# Patient Record
Sex: Male | Born: 1985 | Race: Black or African American | Hispanic: No | Marital: Single | Smoking: Current every day smoker
Health system: Southern US, Community
[De-identification: ages and names within clinical notes are randomized; demographics above are authoritative.]

## PROBLEM LIST (undated history)

## (undated) DIAGNOSIS — F640 Transsexualism: Secondary | ICD-10-CM

## (undated) DIAGNOSIS — B20 Human immunodeficiency virus [HIV] disease: Secondary | ICD-10-CM

## (undated) DIAGNOSIS — Z789 Other specified health status: Secondary | ICD-10-CM

## (undated) DIAGNOSIS — Z21 Asymptomatic human immunodeficiency virus [HIV] infection status: Secondary | ICD-10-CM

## (undated) HISTORY — PX: APPENDECTOMY: SHX54

---
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
## 2008-05-11 ENCOUNTER — Emergency Department (HOSPITAL_COMMUNITY): Admission: EM | Admit: 2008-05-11 | Discharge: 2008-05-11 | Payer: Self-pay | Admitting: Emergency Medicine

## 2009-01-13 ENCOUNTER — Emergency Department (HOSPITAL_COMMUNITY): Admission: EM | Admit: 2009-01-13 | Discharge: 2009-01-13 | Payer: Self-pay | Admitting: Emergency Medicine

## 2011-03-07 LAB — POCT CARDIAC MARKERS: Myoglobin, poc: 31.6 ng/mL (ref 12–200)

## 2013-05-02 ENCOUNTER — Encounter (HOSPITAL_COMMUNITY): Payer: Self-pay | Admitting: Emergency Medicine

## 2013-05-02 ENCOUNTER — Emergency Department (HOSPITAL_COMMUNITY)
Admission: EM | Admit: 2013-05-02 | Discharge: 2013-05-02 | Disposition: A | Discharge status: Home / Self Care | Payer: Self-pay | Attending: Emergency Medicine | Admitting: Emergency Medicine

## 2013-05-02 DIAGNOSIS — R110 Nausea: Secondary | ICD-10-CM | POA: Insufficient documentation

## 2013-05-02 DIAGNOSIS — L02419 Cutaneous abscess of limb, unspecified: Secondary | ICD-10-CM | POA: Insufficient documentation

## 2013-05-02 DIAGNOSIS — R6883 Chills (without fever): Secondary | ICD-10-CM | POA: Insufficient documentation

## 2013-05-02 DIAGNOSIS — L0390 Cellulitis, unspecified: Secondary | ICD-10-CM

## 2013-05-02 DIAGNOSIS — F17200 Nicotine dependence, unspecified, uncomplicated: Secondary | ICD-10-CM | POA: Insufficient documentation

## 2013-05-02 DIAGNOSIS — R21 Rash and other nonspecific skin eruption: Secondary | ICD-10-CM | POA: Insufficient documentation

## 2013-05-02 MED ORDER — SULFAMETHOXAZOLE-TRIMETHOPRIM 800-160 MG PO TABS: 1.0000 | ORAL_TABLET | Freq: Two times a day (BID) | ORAL | Status: DC

## 2013-05-02 MED ORDER — IBUPROFEN 800 MG PO TABS: 800.0000 mg | ORAL_TABLET | Freq: Three times a day (TID) | ORAL | Status: DC

## 2013-05-02 MED ORDER — SULFAMETHOXAZOLE-TMP DS 800-160 MG PO TABS
1.0000 | ORAL_TABLET | Freq: Once | ORAL | Status: AC
  Administered 2013-05-02: 1 via ORAL
  Filled 2013-05-02: qty 1

## 2013-05-02 MED ORDER — IBUPROFEN 800 MG PO TABS
800.0000 mg | ORAL_TABLET | Freq: Once | ORAL | Status: AC
  Administered 2013-05-02: 800 mg via ORAL
  Filled 2013-05-02: qty 1

## 2013-05-02 NOTE — ED Provider Notes (Signed)
Medical screening examination/treatment/procedure(s) were performed by non-physician practitioner and as supervising physician I was immediately available for consultation/collaboration.    Celene Kras, MD 05/02/13 [phone]

## 2013-05-02 NOTE — ED Provider Notes (Signed)
History     CSN: [id number]  Arrival date & time 05/02/13  7829   First MD Initiated Contact with Patient 05/02/13 [phone]      Chief Complaint  Patient presents with  . Insect Bite    (Consider location/radiation/quality/duration/timing/severity/associated sxs/prior treatment) HPI  Tyler Vargas is a 27 y.o.male presenting to the ER with complaints of warmth and redness after possible insect bite to right thigh. It started on Tuesday and it began to itch and he continued to scratch it until today he began to get concerned when it felt hot and somewhat tender to the touch. He admits to feeling a bit nauseous and having an episode of chills but has not have fevers, vomiting, diarrhea or weakness. He is afebrile here and his vitals are stable.   History reviewed. No pertinent past medical history.  Past Surgical History  Procedure Laterality Date  . Appendectomy      No family history on file.  History  Substance Use Topics  . Smoking status: Current Every Day Smoker -- 10.00 packs/day    Types: Cigarettes  . Smokeless tobacco: Not on file  . Alcohol Use: Yes     Comment: weekends socially      Review of Systems  Skin: Positive for rash.  All other systems reviewed and are negative.    Allergies  Review of patient's allergies indicates no known allergies.  Home Medications   Current Outpatient Rx  Name  Route  Sig  Dispense  Refill  . ibuprofen (ADVIL,MOTRIN) 800 MG tablet   Oral   Take 1 tablet (800 mg total) by mouth 3 (three) times daily.   21 tablet   0   . sulfamethoxazole-trimethoprim (BACTRIM DS) 800-160 MG per tablet   Oral   Take 1 tablet by mouth 2 (two) times daily.   14 tablet   0     BP 141/82  Pulse 91  Temp(Src) 98.7 F (37.1 C) (Oral)  Resp 20  SpO2 96%  Physical Exam  Nursing note and vitals reviewed. Constitutional: He appears well-developed and well-nourished. No distress.  HENT:  Head: Normocephalic and atraumatic.  Eyes:  Pupils are equal, round, and reactive to light.  Neck: Normal range of motion. Neck supple.  Cardiovascular: Normal rate and regular rhythm.   Pulmonary/Chest: Effort normal.  Abdominal: Soft.  Neurological: He is alert.  Skin: Skin is warm and dry.       ED Course  Procedures (including critical care time)  Labs Reviewed - No data to display No results found.   1. Cellulitis       MDM  Given Bactrim to cover for cellulitis and instructions on specific s/sx that warrant return. Advised he is at risk for abscess and if that develops he needs to go to an Urgent Care or return to the ED. Patient looks well, does not appear sick or ill. Cellulitis is very mild.   27 y.o.Tyler Vargas's evaluation in the Emergency Department is complete. It has been determined that no acute conditions requiring further emergency intervention are present at this time. The patient/guardian have been advised of the diagnosis and plan. We have discussed signs and symptoms that warrant return to the ED, such as changes or worsening in symptoms.  Vital signs are stable at discharge. Filed Vitals:   05/02/13 0601  BP: 141/82  Pulse: 91  Temp: 98.7 F (37.1 C)  Resp: 20    Patient/guardian has voiced understanding and agreed to follow-up with the PCP  or specialist.          Dorthula Matas, PA-C 05/02/13 [phone]

## 2013-05-02 NOTE — ED Notes (Signed)
Pt. States that this past Tuesday he was scratching his right thigh and then later that evening it became swollen and painful. He has been taking generic benedryl since that time. Pt states he did have chills and nauseated in the last 24hrs. He has a area on his right lateral thigh just proximal to his hip that is hard, reddened, swollen and warm to touch.

## 2014-03-07 ENCOUNTER — Emergency Department (HOSPITAL_COMMUNITY)
Admission: EM | Admit: 2014-03-07 | Discharge: 2014-03-07 | Disposition: A | Discharge status: Home / Self Care | Payer: Self-pay | Attending: Emergency Medicine | Admitting: Emergency Medicine

## 2014-03-07 ENCOUNTER — Encounter (HOSPITAL_COMMUNITY): Payer: Self-pay | Admitting: Emergency Medicine

## 2014-03-07 ENCOUNTER — Emergency Department (HOSPITAL_COMMUNITY): Payer: Self-pay

## 2014-03-07 DIAGNOSIS — S060X9A Concussion with loss of consciousness of unspecified duration, initial encounter: Secondary | ICD-10-CM

## 2014-03-07 DIAGNOSIS — T148XXA Other injury of unspecified body region, initial encounter: Secondary | ICD-10-CM

## 2014-03-07 DIAGNOSIS — S0180XA Unspecified open wound of other part of head, initial encounter: Secondary | ICD-10-CM | POA: Insufficient documentation

## 2014-03-07 DIAGNOSIS — S02401A Maxillary fracture, unspecified, initial encounter for closed fracture: Secondary | ICD-10-CM | POA: Insufficient documentation

## 2014-03-07 DIAGNOSIS — S01501A Unspecified open wound of lip, initial encounter: Secondary | ICD-10-CM | POA: Insufficient documentation

## 2014-03-07 DIAGNOSIS — S060XAA Concussion with loss of consciousness status unknown, initial encounter: Secondary | ICD-10-CM | POA: Insufficient documentation

## 2014-03-07 DIAGNOSIS — S032XXA Dislocation of tooth, initial encounter: Secondary | ICD-10-CM

## 2014-03-07 DIAGNOSIS — F17200 Nicotine dependence, unspecified, uncomplicated: Secondary | ICD-10-CM | POA: Insufficient documentation

## 2014-03-07 DIAGNOSIS — R4182 Altered mental status, unspecified: Secondary | ICD-10-CM | POA: Insufficient documentation

## 2014-03-07 DIAGNOSIS — S20219A Contusion of unspecified front wall of thorax, initial encounter: Secondary | ICD-10-CM | POA: Insufficient documentation

## 2014-03-07 DIAGNOSIS — Z23 Encounter for immunization: Secondary | ICD-10-CM | POA: Insufficient documentation

## 2014-03-07 DIAGNOSIS — IMO0002 Reserved for concepts with insufficient information to code with codable children: Secondary | ICD-10-CM

## 2014-03-07 DIAGNOSIS — F1010 Alcohol abuse, uncomplicated: Secondary | ICD-10-CM | POA: Insufficient documentation

## 2014-03-07 DIAGNOSIS — S025XXA Fracture of tooth (traumatic), initial encounter for closed fracture: Secondary | ICD-10-CM | POA: Insufficient documentation

## 2014-03-07 DIAGNOSIS — S02400A Malar fracture unspecified, initial encounter for closed fracture: Secondary | ICD-10-CM | POA: Insufficient documentation

## 2014-03-07 DIAGNOSIS — F1210 Cannabis abuse, uncomplicated: Secondary | ICD-10-CM | POA: Insufficient documentation

## 2014-03-07 DIAGNOSIS — S0993XA Unspecified injury of face, initial encounter: Secondary | ICD-10-CM | POA: Diagnosis present

## 2014-03-07 LAB — BASIC METABOLIC PANEL
BUN: 11 mg/dL (ref 6–23)
BUN: 8 mg/dL (ref 6–23)
CALCIUM: 9.1 mg/dL (ref 8.4–10.5)
CO2: 7 mEq/L — CL (ref 19–32)
CO2: 16 meq/L — AB (ref 19–32)
Calcium: 8.1 mg/dL — ABNORMAL LOW (ref 8.4–10.5)
Chloride: 97 mEq/L (ref 96–112)
Chloride: 104 mEq/L (ref 96–112)
Creatinine, Ser: 1.31 mg/dL (ref 0.50–1.35)
Creatinine, Ser: 0.8 mg/dL (ref 0.50–1.35)
GFR calc Af Amer: >90 mL/min (ref >90)
GFR calc non Af Amer: 73 mL/min — ABNORMAL LOW (ref >90)
GFR calc non Af Amer: >90 mL/min (ref >90)
GFR, EST AFRICAN AMERICAN: 85 mL/min — AB (ref >90)
GLUCOSE: 94 mg/dL (ref 70–99)
Glucose, Bld: 151 mg/dL — ABNORMAL HIGH (ref 70–99)
POTASSIUM: 4.2 meq/L (ref 3.7–5.3)
Potassium: 3.6 mEq/L — ABNORMAL LOW (ref 3.7–5.3)
SODIUM: 141 meq/L (ref 137–147)
SODIUM: 139 meq/L (ref 137–147)

## 2014-03-07 LAB — ABO/RH: ABO/RH(D): B POS

## 2014-03-07 LAB — I-STAT ARTERIAL BLOOD GAS, ED
Acid-base deficit: 9 mmol/L — ABNORMAL HIGH (ref 0.0–2.0)
BICARBONATE: 18 meq/L — AB (ref 20.0–24.0)
O2 Saturation: 97 %
PCO2 ART: 40.7 mmHg (ref 35.0–45.0)
PH ART: 7.254 — AB (ref 7.350–7.450)
Patient temperature: 98.6
TCO2: 19 mmol/L (ref 0–100)
pO2, Arterial: 107 mmHg — ABNORMAL HIGH (ref 80.0–100.0)

## 2014-03-07 LAB — URINALYSIS, ROUTINE W REFLEX MICROSCOPIC
Bilirubin Urine: NEGATIVE
GLUCOSE, UA: NEGATIVE mg/dL
Ketones, ur: NEGATIVE mg/dL
LEUKOCYTES UA: NEGATIVE
NITRITE: NEGATIVE
PROTEIN: 100 mg/dL — AB
Specific Gravity, Urine: 1.014 (ref 1.005–1.030)
UROBILINOGEN UA: 0.2 mg/dL (ref 0.0–1.0)
pH: 5.5 (ref 5.0–8.0)

## 2014-03-07 LAB — CBC WITH DIFFERENTIAL/PLATELET
BASOS ABS: 0 10*3/uL (ref 0.0–0.1)
Basophils Relative: 0 % (ref 0–1)
Eosinophils Absolute: 0.2 10*3/uL (ref 0.0–0.7)
Eosinophils Relative: 1 % (ref 0–5)
HCT: 44.2 % (ref 39.0–52.0)
HEMOGLOBIN: 14.2 g/dL (ref 13.0–17.0)
LYMPHS PCT: 31 % (ref 12–46)
Lymphs Abs: 5.7 10*3/uL — ABNORMAL HIGH (ref 0.7–4.0)
MCH: 32.9 pg (ref 26.0–34.0)
MCHC: 32.1 g/dL (ref 30.0–36.0)
MCV: 102.3 fL — ABNORMAL HIGH (ref 78.0–100.0)
MONO ABS: 0.7 10*3/uL (ref 0.1–1.0)
Monocytes Relative: 4 % (ref 3–12)
NEUTROS PCT: 64 % (ref 43–77)
Neutro Abs: 11.7 10*3/uL — ABNORMAL HIGH (ref 1.7–7.7)
Platelets: 392 10*3/uL (ref 150–400)
RBC: 4.32 MIL/uL (ref 4.22–5.81)
RDW: 14.8 % (ref 11.5–15.5)
WBC: 18.3 10*3/uL — ABNORMAL HIGH (ref 4.0–10.5)

## 2014-03-07 LAB — TYPE AND SCREEN
ABO/RH(D): B POS
ANTIBODY SCREEN: NEGATIVE

## 2014-03-07 LAB — RAPID URINE DRUG SCREEN, HOSP PERFORMED
AMPHETAMINES: NOT DETECTED
Barbiturates: NOT DETECTED
Benzodiazepines: NOT DETECTED
Cocaine: NOT DETECTED
Opiates: NOT DETECTED
Tetrahydrocannabinol: POSITIVE — AB

## 2014-03-07 LAB — URINE MICROSCOPIC-ADD ON

## 2014-03-07 LAB — ETHANOL: ALCOHOL ETHYL (B): 167 mg/dL — AB (ref 0–11)

## 2014-03-07 LAB — CDS SEROLOGY

## 2014-03-07 LAB — APTT: APTT: 27 s (ref 24–37)

## 2014-03-07 LAB — PROTIME-INR
INR: 1.17 (ref 0.00–1.49)
Prothrombin Time: 14.7 seconds (ref 11.6–15.2)

## 2014-03-07 MED ORDER — FENTANYL CITRATE 0.05 MG/ML IJ SOLN
50.0000 ug | Freq: Once | INTRAMUSCULAR | Status: AC
  Administered 2014-03-07: 50 ug via INTRAVENOUS

## 2014-03-07 MED ORDER — PREDNISONE 10 MG PO TABS: ORAL_TABLET | ORAL | Status: DC

## 2014-03-07 MED ORDER — ONDANSETRON HCL 4 MG/2ML IJ SOLN
4.0000 mg | Freq: Once | INTRAMUSCULAR | Status: AC
  Administered 2014-03-07: 4 mg via INTRAVENOUS
  Filled 2014-03-07: qty 2

## 2014-03-07 MED ORDER — FENTANYL CITRATE 0.05 MG/ML IJ SOLN
50.0000 ug | Freq: Once | INTRAMUSCULAR | Status: AC
  Administered 2014-03-07: 50 ug via INTRAVENOUS
  Filled 2014-03-07 (×2): qty 2

## 2014-03-07 MED ORDER — SODIUM CHLORIDE 0.9 % IV BOLUS (SEPSIS)
1000.0000 mL | Freq: Once | INTRAVENOUS | Status: AC
  Administered 2014-03-07: 1000 mL via INTRAVENOUS

## 2014-03-07 MED ORDER — DEXAMETHASONE SODIUM PHOSPHATE 10 MG/ML IJ SOLN
10.0000 mg | Freq: Once | INTRAMUSCULAR | Status: AC
  Administered 2014-03-07: 10 mg via INTRAVENOUS
  Filled 2014-03-07: qty 1

## 2014-03-07 MED ORDER — FENTANYL CITRATE 0.05 MG/ML IJ SOLN
INTRAMUSCULAR | Status: AC
  Filled 2014-03-07: qty 2

## 2014-03-07 MED ORDER — OXYCODONE-ACETAMINOPHEN 5-325 MG PO TABS: 1.0000 | ORAL_TABLET | Freq: Four times a day (QID) | ORAL | Status: DC | PRN

## 2014-03-07 MED ORDER — CEFAZOLIN SODIUM 1-5 GM-% IV SOLN
1.0000 g | Freq: Once | INTRAVENOUS | Status: AC
  Administered 2014-03-07: 1 g via INTRAVENOUS
  Filled 2014-03-07: qty 50

## 2014-03-07 MED ORDER — SODIUM CHLORIDE 0.9 % IV BOLUS (SEPSIS)
1000.0000 mL | INTRAVENOUS | Status: AC
  Administered 2014-03-07: 1000 mL via INTRAVENOUS

## 2014-03-07 MED ORDER — TETANUS-DIPHTH-ACELL PERTUSSIS 5-2.5-18.5 LF-MCG/0.5 IM SUSP
0.5000 mL | Freq: Once | INTRAMUSCULAR | Status: AC
  Administered 2014-03-07: 0.5 mL via INTRAMUSCULAR
  Filled 2014-03-07: qty 0.5

## 2014-03-07 MED ORDER — CEPHALEXIN 500 MG PO CAPS: 500.0000 mg | ORAL_CAPSULE | Freq: Four times a day (QID) | ORAL | Status: AC
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
## 2014-03-07 NOTE — ED Notes (Signed)
[facility] remains [facility], answering questions, NAD, calm, no dyspnea noted, airway remains intact, VSS.

## 2014-03-07 NOTE — ED Notes (Signed)
[facility]
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
## 2014-03-07 NOTE — ED Notes (Signed)
Patient discharged to home with family. NAD.  

## 2014-03-07 NOTE — ED Notes (Signed)
Per Dr. Karleen HampshireSpencer. Will complete summary/note and enter medications for opthalmology.
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
## 2014-03-07 NOTE — ED Notes (Signed)
Dr Spencer at bedside

## 2014-03-07 NOTE — ED Notes (Signed)
Back from CT, no changes, using urinal with assistance. earring removed in CT, placed in denture cup (labeled).

## 2014-03-07 NOTE — Discharge Instructions (Signed)
Concussion, Adult °A concussion, or closed-head injury, is a brain injury caused by a direct blow to the head or by a quick and sudden movement (jolt) of the head or neck. Concussions are usually not life-threatening. Even so, the effects of a concussion can be serious. If you have had a concussion before, you are more likely to experience concussion-like symptoms after a direct blow to the head.  °CAUSES  °· Direct blow to the head, such as from running into another player during a soccer game, being hit in a fight, or hitting your head on a hard surface. °· A jolt of the head or neck that causes the brain to move back and forth inside the skull, such as in a car crash. °SIGNS AND SYMPTOMS  °The signs of a concussion can be hard to notice. Early on, they may be missed by you, family members, and health care providers. You may look fine but act or feel differently. °Symptoms are usually temporary, but they may last for days, weeks, or even longer. Some symptoms may appear right away while others may not show up for hours or days. Every head injury is different. Symptoms include:  °· Mild to moderate headaches that will not go away. °· A feeling of pressure inside your head.  °· Having more trouble than usual:   °· Learning or remembering things you have heard. °· Answering questions.  °· Paying attention or concentrating.   °· Organizing daily tasks.   °· Making decisions and solving problems.   °· Slowness in thinking, acting or reacting, speaking, or reading.   °· Getting lost or being easily confused.   °· Feeling tired all the time or lacking energy (fatigued).   °· Feeling drowsy.   °· Sleep disturbances.   °· Sleeping more than usual.   °· Sleeping less than usual.   °· Trouble falling asleep.   °· Trouble sleeping (insomnia).   °· Loss of balance or feeling lightheaded or dizzy.   °· Nausea or vomiting.   °· Numbness or tingling.   °· Increased sensitivity to:   °· Sounds.   °· Lights.   °· Distractions.    °· Vision problems or eyes that tire easily.   °· Diminished sense of taste or smell.   °· Ringing in the ears.   °· Mood changes such as feeling sad or anxious.   °· Becoming easily irritated or angry for little or no reason.   °· Lack of motivation. °· Seeing or hearing things other people do not see or hear (hallucinations). °DIAGNOSIS  °Your health care provider can usually diagnose a concussion based on a description of your injury and symptoms. He or she will ask whether you passed out (lost consciousness) and whether you are having trouble remembering events that happened right before and during your injury.  °Your evaluation might include:  °· A brain scan to look for signs of injury to the brain. Even if the test shows no injury, you may still have a concussion.   °· Blood tests to be sure other problems are not present. °TREATMENT  °· Concussions are usually treated in an emergency department, in urgent care, or at a clinic. You may need to stay in the hospital overnight for further treatment.   °· Tell your health care provider if you are taking any medicines, including prescription medicines, over-the-counter medicines, and natural remedies. Some medicines, such as blood thinners (anticoagulants) and aspirin, may increase the chance of complications. Also tell your health care provider whether you have had alcohol or are taking illegal drugs. This information may affect treatment. °· Your health care provider will send you   home with important instructions to follow. °· How fast you will recover from a concussion depends on many factors. These factors include how severe your concussion is, what part of your brain was injured, your age, and how healthy you were before the concussion. °· Most people with mild injuries recover fully. Recovery can take time. In general, recovery is slower in older persons. Also, persons who have had a concussion in the past or have other medical problems may find that it  takes longer to recover from their current injury. °HOME CARE INSTRUCTIONS  °General Instructions °· Carefully follow the directions your health care provider gave you. °· Only take over-the-counter or prescription medicines for pain, discomfort, or fever as directed by your health care provider. °· Take only those medicines that your health care provider has approved. °· Do not drink alcohol until your health care provider says you are well enough to do so. Alcohol and certain other drugs may slow your recovery and can put you at risk of further injury. °· If it is harder than usual to remember things, write them down. °· If you are easily distracted, try to do one thing at a time. For example, do not try to watch TV while fixing dinner. °· Talk with family members or close friends when making important decisions. °· Keep all follow-up appointments. Repeated evaluation of your symptoms is recommended for your recovery. °· Watch your symptoms and tell others to do the same. Complications sometimes occur after a concussion. Older adults with a brain injury may have a higher risk of serious complications such as of a blood clot on the brain. °· Tell your teachers, school nurse, school counselor, coach, athletic trainer, or work manager about your injury, symptoms, and restrictions. Tell them about what you can or cannot do. They should watch for:   °· Increased problems with attention or concentration.   °· Increased difficulty remembering or learning new information.   °· Increased time needed to complete tasks or assignments.   °· Increased irritability or decreased ability to cope with stress.   °· Increased symptoms.   °· Rest. Rest helps the brain to heal. Make sure you: °· Get plenty of sleep at night. Avoid staying up late at night. °· Keep the same bedtime hours on weekends and weekdays. °· Rest during the day. Take daytime naps or rest breaks when you feel tired. °· Limit activities that require a lot of  thought or concentration. These includes   °· Doing homework or job-related work.   °· Watching TV.   °· Working on the computer. °· Avoid any situation where there is potential for another head injury (football, hockey, soccer, basketball, martial arts, downhill snow sports and horseback riding). Your condition will get worse every time you experience a concussion. You should avoid these activities until you are evaluated by the appropriate follow-up caregivers. °Returning To Your Regular Activities °You will need to return to your normal activities slowly, not all at once. You must give your body and brain enough time for recovery. °· Do not return to sports or other athletic activities until your health care provider tells you it is safe to do so. °· Ask your health care provider when you can drive, ride a bicycle, or operate heavy machinery. Your ability to react may be slower after a brain injury. Never do these activities if you are dizzy. °· Ask your health care provider about when you can return to work or school. °Preventing Another Concussion °It is very important to avoid another   brain injury, especially before you have recovered. In rare cases, another injury can lead to permanent brain damage, brain swelling, or death. The risk of this is greatest during the first 7 10 days after a head injury. Avoid injuries by:   Wearing a seat belt when riding in a car.   Drinking alcohol only in moderation.   Wearing a helmet when biking, skiing, skateboarding, skating, or doing similar activities.  Avoiding activities that could lead to a second concussion, such as contact or recreational sports, until your health care provider says it is OK.  Taking safety measures in your home.   Remove clutter and tripping hazards from floors and stairways.   Use grab bars in bathrooms and handrails by stairs.   Place non-slip mats on floors and in bathtubs.   Improve lighting in dim areas. SEEK MEDICAL  CARE IF:   You have increased problems paying attention or concentrating.   You have increased difficulty remembering or learning new information.   You need more time to complete tasks or assignments than before.   You have increased irritability or decreased ability to cope with stress.  You have more symptoms than before. Seek medical care if you have any of the following symptoms for more than 2 weeks after your injury:   Lasting (chronic) headaches.   Dizziness or balance problems.   Nausea.  Vision problems.   Increased sensitivity to noise or light.   Depression or mood swings.   Anxiety or irritability.   Memory problems.   Difficulty concentrating or paying attention.   Sleep problems.   Feeling tired all the time. SEEK IMMEDIATE MEDICAL CARE IF:   You have severe or worsening headaches. These may be a sign of a blood clot in the brain.  You have weakness (even if only in one hand, leg, or part of the face).  You have numbness.  You have decreased coordination.   You vomit repeatedly.  You have increased sleepiness.  One pupil is larger than the other.   You have convulsions.   You have slurred speech.   You have increased confusion. This may be a sign of a blood clot in the brain.  You have increased restlessness, agitation, or irritability.   You are unable to recognize people or places.   You have neck pain.   It is difficult to wake you up.   You have unusual behavior changes.   You lose consciousness. MAKE SURE YOU:   Understand these instructions.  Will watch your condition.  Will get help right away if you are not doing well or get worse. Document Released: 01/27/2004 Document Revised: 07/09/2013 Document Reviewed: 05/29/2013 [facility] Patient Information 2014 [employer], [place].  Eat a soft diet. Maintain good oral hygiene. Keep the head of your bed elevated. You may clean the wounds on your face with soap  and water.

## 2014-03-07 NOTE — ED Notes (Signed)
No changes. To CT. Ancef & warm NS infusing.
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
## 2014-03-07 NOTE — ED Notes (Addendum)
Dr Kelly SplinterSanger at bedside to suture wounds on patients face.

## 2014-03-09 ENCOUNTER — Encounter (HOSPITAL_COMMUNITY): Payer: Self-pay | Admitting: Emergency Medicine

## 2014-04-17 ENCOUNTER — Emergency Department (HOSPITAL_BASED_OUTPATIENT_CLINIC_OR_DEPARTMENT_OTHER)
Admission: EM | Admit: 2014-04-17 | Discharge: 2014-04-17 | Disposition: A | Discharge status: Home / Self Care | Payer: Self-pay | Attending: Emergency Medicine | Admitting: Emergency Medicine

## 2014-04-17 ENCOUNTER — Encounter (HOSPITAL_BASED_OUTPATIENT_CLINIC_OR_DEPARTMENT_OTHER): Payer: Self-pay | Admitting: Emergency Medicine

## 2014-04-17 DIAGNOSIS — Z791 Long term (current) use of non-steroidal anti-inflammatories (NSAID): Secondary | ICD-10-CM | POA: Insufficient documentation

## 2014-04-17 DIAGNOSIS — F17200 Nicotine dependence, unspecified, uncomplicated: Secondary | ICD-10-CM | POA: Insufficient documentation

## 2014-04-17 DIAGNOSIS — K029 Dental caries, unspecified: Secondary | ICD-10-CM | POA: Insufficient documentation

## 2014-04-17 DIAGNOSIS — K047 Periapical abscess without sinus: Secondary | ICD-10-CM

## 2014-04-17 DIAGNOSIS — Z792 Long term (current) use of antibiotics: Secondary | ICD-10-CM | POA: Insufficient documentation

## 2014-04-17 MED ORDER — IBUPROFEN 600 MG PO TABS: 600.0000 mg | ORAL_TABLET | Freq: Four times a day (QID) | ORAL | Status: DC | PRN

## 2014-04-17 MED ORDER — PENICILLIN V POTASSIUM 500 MG PO TABS: 500.0000 mg | ORAL_TABLET | Freq: Four times a day (QID) | ORAL | Status: DC

## 2014-04-17 MED ORDER — PENICILLIN V POTASSIUM 250 MG PO TABS
500.0000 mg | ORAL_TABLET | Freq: Four times a day (QID) | ORAL | Status: DC
  Administered 2014-04-17: 500 mg via ORAL
  Filled 2014-04-17: qty 2

## 2014-04-17 NOTE — ED Provider Notes (Signed)
CSN: [id number]     Arrival date & time 04/17/14  2993 History   First MD Initiated Contact with Patient 04/17/14 [phone]     Chief Complaint  Patient presents with  . Oral Swelling      HPI Pt reports area to left, top, back tooth that has been decayed - presents with left sided facial swelling and pain noted area last night. Patient reports taking Ibuprofen at 0400. Pt resides at [facility].   History reviewed. No pertinent past medical history. Past Surgical History  Procedure Laterality Date  . Appendectomy     History reviewed. No pertinent family history. History  Substance Use Topics  . Smoking status: Current Every Day Smoker -- 10.00 packs/day    Types: Cigarettes  . Smokeless tobacco: Not on file  . Alcohol Use: Yes     Comment: weekends socially    Review of Systems  All other systems reviewed and are negative   Allergies  Review of patient's allergies indicates no known allergies.  Home Medications   Prior to Admission medications   Medication Sig Start Date End Date Taking? Authorizing Provider  ibuprofen (ADVIL,MOTRIN) 800 MG tablet Take 1 tablet (800 mg total) by mouth 3 (three) times daily. 05/02/13  Yes Tiffany Irine Seal, PA-C  diphenhydrAMINE (BENADRYL) 25 mg capsule Take 25 mg by mouth every 4 (four) hours as needed for itching.    Historical Provider, MD  ibuprofen (ADVIL,MOTRIN) 600 MG tablet Take 1 tablet (600 mg total) by mouth every 6 (six) hours as needed. 04/17/14   Nelia Shi, MD  oxyCODONE-acetaminophen (PERCOCET) 5-325 MG per tablet Take 1-2 tablets by mouth every 6 (six) hours as needed for moderate pain. 03/07/14   Junius Argyle, MD  penicillin v potassium (VEETID) 500 MG tablet Take 1 tablet (500 mg total) by mouth 4 (four) times daily. 04/17/14   Nelia Shi, MD  predniSONE (DELTASONE) 10 MG tablet Take 100 mg or 10 tablets on days 1 and 2. Take 50 mg or 5 tablets on days 3 through 5. Take 20 mg or 2 tablets on days 6 through 9. 03/07/14    Junius Argyle, MD  sulfamethoxazole-trimethoprim (BACTRIM DS) 800-160 MG per tablet Take 1 tablet by mouth 2 (two) times daily. 05/02/13   Tiffany Irine Seal, PA-C   BP 129/75  Pulse 99  Temp(Src) 99.6 F (37.6 C) (Oral)  Resp 20  Ht 5\' 8"  (1.727 m)  Wt 165 lb (74.844 kg)  BMI 25.09 kg/m2  SpO2 100% Physical Exam  Nursing note and vitals reviewed. Constitutional: He is oriented to person, place, and time. He appears well-developed and well-nourished. No distress.  HENT:  Head: Normocephalic and atraumatic.    Mouth/Throat: Uvula is midline and oropharynx is clear and moist. Dental abscesses and dental caries present.    Eyes: Pupils are equal, round, and reactive to light.  Neck: Normal range of motion.  Cardiovascular: Normal rate and intact distal pulses.   Pulmonary/Chest: No respiratory distress.  Abdominal: Normal appearance. He exhibits no distension.  Musculoskeletal: Normal range of motion.  Neurological: He is alert and oriented to person, place, and time. No cranial nerve deficit.  Skin: Skin is warm and dry. No rash noted.  Psychiatric: He has a normal mood and affect. His behavior is normal.    ED Course  Procedures (including critical care time) Labs Review Labs Reviewed - No data to display  Imaging Review No results found.    MDM  Final diagnoses:  Tooth abscess        Nelia Shiobert L Shamira Toutant, MD 04/20/14 [phone]

## 2014-04-17 NOTE — Discharge Instructions (Signed)
Abscessed Tooth An abscessed tooth is an infection around your tooth. It may be caused by holes or damage to the tooth (cavity) or a dental disease. An abscessed tooth causes mild to very bad pain in and around the tooth. See your dentist right away if you have tooth or gum pain. HOME CARE  Take your medicine as told. Finish it even if you start to feel better.  Do not drive after taking pain medicine.  Rinse your mouth (gargle) often with salt water ( teaspoon salt in 8 ounces of warm water).  Do not apply heat to the outside of your face. GET HELP RIGHT AWAY IF:   You have a temperature by mouth above 102 F (38.9 C), not controlled by medicine.  You have chills and a very bad headache.  You have problems breathing or swallowing.  Your mouth will not open.  You develop puffiness (swelling) on the neck or around the eye.  Your pain is not helped by medicine.  Your pain is getting worse instead of better. MAKE SURE YOU:   Understand these instructions.  Will watch your condition.  Will get help right away if you are not doing well or get worse. Document Released: 04/24/2008 Document Revised: 01/29/2012 Document Reviewed: 02/14/2011 [facility] Patient Information 2014 [place].    Emergency Department Resource Guide 1) Find a Doctor and Pay Out of Pocket Although you won't have to find out who is covered by your insurance plan, it is a good idea to ask around and get recommendations. You will then need to call the office and see if the doctor you have chosen will accept you as a new patient and what types of options they offer for patients who are self-pay. Some doctors offer discounts or will set up payment plans for their patients who do not have insurance, but you will need to ask so you aren't surprised when you get to your appointment.  2) Contact Your Local Health Department Not all health departments have doctors that can see patients for sick visits, but many  do, so it is worth a call to see if yours does. If you don't know where your local health department is, you can check in your phone book. The CDC also has a tool to help you locate your state's health department, and many state websites also have listings of all of their local health departments.  3) Find a [facility] If your illness is not likely to be very severe or complicated, you may want to try a walk in clinic. These are popping up all over the country in pharmacies, drugstores, and shopping centers. They're usually staffed by nurse practitioners or physician assistants that have been trained to treat common illnesses and complaints. They're usually fairly quick and inexpensive. However, if you have serious medical issues or chronic medical problems, these are probably not your best option.  No Primary Care Doctor: - Call Health Connect at  [phone] - they can help you locate a primary care doctor that  accepts your insurance, provides certain services, etc. - Physician Referral Service- [phone]  Chronic Pain Problems: Organization         Address  Phone   Notes  [facility]  [phone] Patients need to be referred by their primary care doctor.   Medication Assistance: Organization         Address  Phone   Notes  [facility] Medication Assistance Program [address]  [place], [place] [address] [phone] --Must be a resident of [facility] -- Must have NO insurance coverage whatsoever (no Medicaid/ Medicare, etc.) -- The pt. MUST have a primary care doctor that directs their care regularly and follows them in the community   [facility]  [phone]   [employer]  [phone]    Agencies that provide inexpensive medical care: Organization         Address  Phone   Notes  [place]  [phone]   [facility]    [phone]   [facility], [place] [address] [phone]   [place] [address], [place] [phone]   [facility]anned Parenthood    [phone]   [facility]    [phone]   [address], [place] Phone:  [phone], Fax:  [phone] Hours of Operation:  9 am - 6 pm, M-F.  Also accepts Medicaid/Medicare and self-pay.  [employer] for [place] [address], [address], [place] Phone: [phone], Fax: [phone]. Hours of Operation:  8:30 am - 5:30 pm, M-F.  Also accepts Medicaid and self-pay.  [facility] [place] [address], [place] Phone: [phone]   [place], [place], [place] [phone], Ext. [phone] Mondays & Thursdays: 7-9 AM.  First 15 patients are seen on a first come, first serve basis.    [place] Providers:  Organization         Address  Phone   Notes  [facility] [address],  [phone] Also accepts self-pay patients.  [facility] [address], [place]  [phone]   [place], [address], [place] [phone]   [facility] [address], [place] [phone]   Lucianne Lei [address], [place]   [phone] Only accepts [place] patients after they have their name applied to their card.   Self-Pay (no insurance) in [facility]:  Organization         Address  Phone   Notes  Sickle Cell Patients, [facility] [place] [phone]   [facility] [place] [phone]   [facility] [place]  [place], [place], [place] [phone]   [facility]/Dr. Osei-Bonsu  [address] [place] or  [address], [place] [phone] Phone number for both [place] and [place] locations is the same.  [facility] [address], [place] [phone]   [facility] [address], [place] or [address] [phone]   [facility] [address], [place] [phone], phone; [phone], fax Sees patients 1st and 3rd Saturday of every month.  Must not qualify for public or private insurance (i.e. Medicaid, Medicare,  Health Choice, Veterans' Benefits)  Household income should be no more than 200% of the poverty level The clinic cannot treat you if you are pregnant or think you are pregnant  Sexually transmitted diseases are not treated at the clinic.    Dental  Care: Organization         Address  Phone  Notes  [facility] [address] [place], [place] [phone] Accepts children up to age 8 who are enrolled in [place] or [place] Health Choice; pregnant women with a Medicaid card; and children who have applied for Medicaid or [place] Health Choice, but were declined, whose parents can pay a reduced fee at time of service.  [facility]  [address], [place] [phone] Accepts children up to age 65 who are enrolled in [place] or [place] Health Choice; pregnant women with a Medicaid card; and children who have applied for Medicaid or [place] Health Choice, but were declined, whose parents can pay a reduced fee at time of service.  [facility]  [address] [place], [place] [phone] Patients are seen by appointment only. Walk-ins are not accepted. [facility] will see patients 22 years of age and older. Monday - Tuesday (8am-5pm) Most Wednesdays (8:30-5pm) $30 per visit, cash only  [facility]  [address], [facility] [phone] Patients are seen by appointment only. Walk-ins are not accepted. [facility] will see patients 76 years of age and older. One Wednesday Evening (Monthly: Volunteer Based).  $30 per visit, cash only  [employer]  [phone] for adults; Children under age 71, call Graduate Pediatric Dentistry at [phone]. Children aged 68-14, please call [phone] to request a pediatric application.  Dental services are provided in all areas of dental care including fillings, crowns and bridges, complete and partial dentures, implants, gum treatment, root canals, and extractions. Preventive care is also provided. Treatment is provided to both adults and children. Patients are selected via a lottery and there is often a waiting list.   [facility] [address] [place]  [phone] [email or web address]   [facility] [address] [place], [place] [phone], Ext. [phone] Second and Fourth Thursday of each month, opens at 6:30 AM; Clinic ends at 9 AM.  Patients are seen on a first-come first-served basis, and a limited number are seen during each clinic.   [facility]  [address] [place], [place] [phone]   Eligibility Requirements You must have lived in [place], [place], or [place] counties for at least the last three months.   You cannot be eligible for state or federal sponsored [place], including [employer], [place], or [employer].   You generally cannot be eligible for healthcare insurance through your employer.    How to apply: Eligibility screenings are held every Tuesday and Wednesday afternoon from 1:00 pm until 4:00 pm. You do not need an appointment for the interview!  [facility] [address], [place], [place] [phone]   [facility] Health Department  [phone]   [facility] Health Department  [phone]   [facility]  Health Department  [phone]

## 2014-04-17 NOTE — ED Notes (Addendum)
Pt reports area to left, top, back tooth that has been decayed - presents with left sided facial swelling and pain noted to area last night. Patient reports taking Ibuprofen at 0400. Pt resides at [facility].

## 2016-08-17 ENCOUNTER — Emergency Department (HOSPITAL_COMMUNITY)
Admission: EM | Admit: 2016-08-17 | Discharge: 2016-08-17 | Disposition: A | Discharge status: Home / Self Care | Payer: Self-pay | Attending: Emergency Medicine | Admitting: Emergency Medicine

## 2016-08-17 ENCOUNTER — Encounter (HOSPITAL_COMMUNITY): Payer: Self-pay | Admitting: Emergency Medicine

## 2016-08-17 DIAGNOSIS — J040 Acute laryngitis: Secondary | ICD-10-CM | POA: Insufficient documentation

## 2016-08-17 DIAGNOSIS — F17210 Nicotine dependence, cigarettes, uncomplicated: Secondary | ICD-10-CM | POA: Insufficient documentation

## 2016-08-17 LAB — RAPID STREP SCREEN (MED CTR MEBANE ONLY): Streptococcus, Group A Screen (Direct): POSITIVE — AB

## 2016-08-17 MED ORDER — PENICILLIN G BENZATHINE & PROC 1200000 UNIT/2ML IM SUSP
1.2000 10*6.[IU] | Freq: Once | INTRAMUSCULAR | Status: AC
  Administered 2016-08-17: 1.2 10*6.[IU] via INTRAMUSCULAR
  Filled 2016-08-17: qty 2

## 2016-08-17 NOTE — ED Triage Notes (Signed)
Onset 2 weeks ago sore throat worsening overtime. Airway intact bilateral equal chest rise and fall. Pain currently 3/10 sore.

## 2016-08-17 NOTE — Discharge Instructions (Signed)
Please perform focal rest and stay well hydrated.  Do not hesitate to return to the emergency department for any new, worsening or concerning symptoms.

## 2016-08-17 NOTE — ED Notes (Signed)
WAITING FOR MED FROM PHARMACY,
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
## 2016-08-17 NOTE — ED Notes (Signed)
Pt c/o sore throat x 1 week. No redness or exudate noted.

## 2017-06-11 ENCOUNTER — Encounter (HOSPITAL_COMMUNITY): Payer: Self-pay | Admitting: *Deleted

## 2017-06-11 ENCOUNTER — Emergency Department (HOSPITAL_COMMUNITY)
Admission: EM | Admit: 2017-06-11 | Discharge: 2017-06-11 | Disposition: A | Discharge status: Home / Self Care | Payer: Self-pay | Attending: Physician Assistant | Admitting: Physician Assistant

## 2017-06-11 ENCOUNTER — Emergency Department (HOSPITAL_COMMUNITY): Payer: Self-pay

## 2017-06-11 DIAGNOSIS — F17210 Nicotine dependence, cigarettes, uncomplicated: Secondary | ICD-10-CM | POA: Insufficient documentation

## 2017-06-11 DIAGNOSIS — M25562 Pain in left knee: Secondary | ICD-10-CM | POA: Insufficient documentation

## 2017-06-11 MED ORDER — IBUPROFEN 800 MG PO TABS
800.0000 mg | ORAL_TABLET | Freq: Once | ORAL | Status: AC
  Administered 2017-06-11: 800 mg via ORAL
  Filled 2017-06-11: qty 1

## 2017-06-11 MED ORDER — IBUPROFEN 800 MG PO TABS
800.0000 mg | ORAL_TABLET | Freq: Three times a day (TID) | ORAL | 0 refills | Status: DC
Start: 2017-06-11 — End: 2019-09-30

## 2017-06-11 NOTE — ED Triage Notes (Signed)
Pt is here with left knee pains that started one week ago when randomly got out of bed.

## 2017-06-11 NOTE — ED Notes (Signed)
Left knee pain x 2 weeks has gotten worse this pat week , denies injury
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
## 2017-06-11 NOTE — Discharge Instructions (Signed)
We are unsure what is causing your pain. We will need to rest ice and elevate. You can use a knee sleeve to help support your knee. Please return with any redness, swelling, worsening pain.

## 2017-11-28 ENCOUNTER — Other Ambulatory Visit: Payer: Self-pay

## 2017-11-28 ENCOUNTER — Encounter (HOSPITAL_COMMUNITY): Payer: Self-pay

## 2017-11-28 ENCOUNTER — Emergency Department (HOSPITAL_COMMUNITY)
Admission: EM | Admit: 2017-11-28 | Discharge: 2017-11-28 | Disposition: A | Discharge status: Home / Self Care | Payer: Self-pay | Attending: Emergency Medicine | Admitting: Emergency Medicine

## 2017-11-28 DIAGNOSIS — L853 Xerosis cutis: Secondary | ICD-10-CM | POA: Insufficient documentation

## 2017-11-28 DIAGNOSIS — R21 Rash and other nonspecific skin eruption: Secondary | ICD-10-CM | POA: Insufficient documentation

## 2017-11-28 DIAGNOSIS — Z79899 Other long term (current) drug therapy: Secondary | ICD-10-CM | POA: Insufficient documentation

## 2017-11-28 DIAGNOSIS — F17210 Nicotine dependence, cigarettes, uncomplicated: Secondary | ICD-10-CM | POA: Insufficient documentation

## 2017-11-28 MED ORDER — FLUCONAZOLE 200 MG PO TABS: 200.0000 mg | ORAL_TABLET | ORAL | 0 refills | Status: AC

## 2017-11-28 MED ORDER — FLUCONAZOLE 100 MG PO TABS
200.0000 mg | ORAL_TABLET | Freq: Once | ORAL | Status: AC
  Administered 2017-11-28: 200 mg via ORAL
  Filled 2017-11-28: qty 2

## 2017-11-28 MED ORDER — CEPHALEXIN 500 MG PO CAPS: 500.0000 mg | ORAL_CAPSULE | Freq: Four times a day (QID) | ORAL | 0 refills | Status: AC

## 2017-11-28 MED ORDER — CEPHALEXIN 250 MG PO CAPS
500.0000 mg | ORAL_CAPSULE | Freq: Once | ORAL | Status: AC
  Administered 2017-11-28: 500 mg via ORAL
  Filled 2017-11-28: qty 2

## 2017-11-28 MED ORDER — DIPHENHYDRAMINE HCL 25 MG PO CAPS
25.0000 mg | ORAL_CAPSULE | Freq: Once | ORAL | Status: AC
  Administered 2017-11-28: 25 mg via ORAL
  Filled 2017-11-28: qty 1

## 2017-11-28 NOTE — ED Notes (Signed)
Pt called x 2 

## 2017-11-28 NOTE — ED Triage Notes (Signed)
Rash to right side of jaw/ neck x 3 days. PT states it started out itchy but is now painful

## 2017-11-28 NOTE — Discharge Instructions (Signed)
Please take all of your antibiotics until finished!   You may develop abdominal discomfort or diarrhea from the antibiotic.  You may help offset this with probiotics which you can buy or get in yogurt. Do not eat  or take the probiotics until 2 hours after your antibiotic.   Take Diflucan, which is an antifungal, weekly for 4 weeks as prescribed.  You received the first dose in the emergency department today, so do not take this medicine again until next week.  Keep the area clean and dry.  Avoid itching the area.  Follow-up with primary care or dermatology for reevaluation of your symptoms.  Return to the emergency department immediately for any concerning signs or symptoms develop such as fever, worsening drainage, or difficulty breathing.

## 2017-11-28 NOTE — ED Provider Notes (Signed)
[facility] EMERGENCY DEPARTMENT Provider Note   CSN: [id number] Arrival date & time: 11/28/17  1141     History   Chief Complaint Chief Complaint  Patient presents with  . Rash    HPI Tyler Vargas is a 32 y.o. male presents today for evaluation of acute onset, progressively worsening rash.  Patient states that the rash began 2 months ago to his ears and has been progressively spreading to his jaw and neck.  He initially thought it may have been due to earrings that he was wearing, but did not improve after moving the earrings.  He states that the rash has been quite itchy and he has been scratching at it.  He states that approximately 3 days ago while preparing for a drug so he applied a new makeup and since then has been experiencing a burning sensation to the rash.  He does endorse clear drainage.  Denies fevers or chills.  No difficulty swallowing or breathing.  He has not tried anything for his symptoms.  He also notes dry flaky skin to his eyebrows and around his scalp which has been ongoing for several months additionally.  He denies any other new medications or new soaps, detergents, lotions, or shampoos.  He does not use any adhesives on his face.  The history is provided by the patient.    History reviewed. No pertinent past medical history.  Patient Active Problem List   Diagnosis Date Noted  . Facial trauma 03/07/2014    Past Surgical History:  Procedure Laterality Date  . APPENDECTOMY      OB History    No data available       Home Medications    Prior to Admission medications   Medication Sig Start Date End Date Taking? Authorizing Provider  cephALEXin (KEFLEX) 500 MG capsule Take 1 capsule (500 mg total) by mouth 4 (four) times daily for 10 days. 11/28/17 12/08/17  Michela Pitcher A, PA-C  diphenhydrAMINE (BENADRYL) 25 mg capsule Take 25 mg by mouth every 4 (four) hours as needed for itching.    [provider]  fluconazole  (DIFLUCAN) 200 MG tablet Take 1 tablet (200 mg total) by mouth once a week for 4 doses. 11/28/17 12/20/17  Michela Pitcher A, PA-C  ibuprofen (ADVIL,MOTRIN) 600 MG tablet Take 1 tablet (600 mg total) by mouth every 6 (six) hours as needed. 04/17/14   Nelva Nay, MD  ibuprofen (ADVIL,MOTRIN) 800 MG tablet Take 1 tablet (800 mg total) by mouth 3 (three) times daily. 05/02/13   Marlon Pel, PA-C  ibuprofen (ADVIL,MOTRIN) 800 MG tablet Take 1 tablet (800 mg total) by mouth 3 (three) times daily. 06/11/17   Mackuen, Courteney Lyn, MD  oxyCODONE-acetaminophen (PERCOCET) 5-325 MG per tablet Take 1-2 tablets by mouth every 6 (six) hours as needed for moderate pain. 03/07/14   Purvis Sheffield, MD  penicillin v potassium (VEETID) 500 MG tablet Take 1 tablet (500 mg total) by mouth 4 (four) times daily. 04/17/14   Nelva Nay, MD  predniSONE (DELTASONE) 10 MG tablet Take 100 mg or 10 tablets on days 1 and 2. Take 50 mg or 5 tablets on days 3 through 5. Take 20 mg or 2 tablets on days 6 through 9. 03/07/14   Purvis Sheffield, MD  sulfamethoxazole-trimethoprim (BACTRIM DS) 800-160 MG per tablet Take 1 tablet by mouth 2 (two) times daily. 05/02/13   Marlon Pel, PA-C    Family History No family history on file.  Social History Social  History   Tobacco Use  . Smoking status: Current Every Day Smoker    Packs/day: 10.00    Types: Cigarettes  . Smokeless tobacco: Never Used  Substance Use Topics  . Alcohol use: Yes    Comment: weekends socially  . Drug use: Yes    Types: Marijuana     Allergies   Patient has no known allergies.   Review of Systems Review of Systems  Constitutional: Negative for chills and fever.  HENT: Negative for trouble swallowing.   Respiratory: Negative for shortness of breath.   Skin: Positive for rash.     Physical Exam Updated Vital Signs BP 137/90 (BP Location: Right Arm)   Pulse 85   Temp 98.8 F (37.1 C) (Oral)   Resp 20   SpO2 100%   Physical Exam    Constitutional: She appears well-developed and well-nourished. No distress.  HENT:  Head: Normocephalic and atraumatic.  No swelling of the lips or tongue.  Airway is patent.  See attached images for further information.  He has a brown crusty rash noted to the ears spreading down the jawline and under the neck.  There is a small amount of clear drainage noted.  Mildly tender to palpation.  No fluctuance or induration.  He also has dry flaky skin noted around the scalp and eyebrows.  No facial swelling.  Eyes: Conjunctivae are normal. Right eye exhibits no discharge. Left eye exhibits no discharge.  Neck: Normal range of motion. Neck supple. No JVD present. No tracheal deviation present.  Cardiovascular: Normal rate.  Pulmonary/Chest: Effort normal.  Abdominal: She exhibits no distension.  Musculoskeletal: She exhibits no edema.  Neurological: She is alert.  Skin: Skin is warm and dry. Rash noted. No erythema.  As detailed in the HENT section.   Psychiatric: She has a normal mood and affect. Her behavior is normal.  Nursing note and vitals reviewed.        ED Treatments / Results  Labs (all labs ordered are listed, but only abnormal results are displayed) Labs Reviewed - No data to display  EKG  EKG Interpretation None       Radiology No results found.  Procedures Procedures (including critical care time)  Medications Ordered in ED Medications  cephALEXin (KEFLEX) capsule 500 mg (not administered)  fluconazole (DIFLUCAN) tablet 200 mg (not administered)     Initial Impression / Assessment and Plan / ED Course  I have reviewed the triage vital signs and the nursing notes.  Pertinent labs & imaging results that were available during my care of the patient were reviewed by me and considered in my medical decision making (see chart for details).     Patient presents with progressively worsening rash for 2 months.  Acute worsening 3 days ago after application of a  new makeup which caused burning sensation.  Rash is suspicious for impetigo versus contact dermatitis with superimposed infection.  Mildly irritated to the touch.  He may also have some evidence of seborrheic dermatitis around the scalp and eyebrows. Patient denies any difficulty breathing or swallowing.  Pt has a patent airway without stridor and is handling secretions without difficulty; no angioedema. No blisters, no pustules, no warmth, no draining sinus tracts, no superficial abscesses, no bullous impetigo, no vesicles, no desquamation, no target lesions with dusky purpura or a central bulla. No concern for SJS, TEN, TSS, tick borne illness, syphilis or other life-threatening condition.  Will discharge with antibiotic and antifungal and recommend follow-up with a dermatologist.  He will also be in contact with [facility] to establish primary care and further follow-up.  Discussed indications for return to the ED. Pt verbalized understanding of and agreement with plan and is safe for discharge home at this time.  He has no complaints prior to discharge.  Discussed case with Dr. Phineas RealMabe who agrees with assessment and plan at this time.    Final Clinical Impressions(s) / ED Diagnoses   Final diagnoses:  Rash    ED Discharge Orders        Ordered    cephALEXin (KEFLEX) 500 MG capsule  4 times daily     11/28/17 1530    fluconazole (DIFLUCAN) 200 MG tablet  Weekly     11/28/17 1530       Kaedan Richert, [place] A, PA-C 11/28/17 1540    Phillis HaggisMabe, Martha L, MD 11/28/17 1545

## 2017-11-28 NOTE — ED Notes (Signed)
Pt up to nurse first asking if his name had been called. States he fell asleep. Informed pt to sit in triage waiting chairs and listen for name to be called. Triage RN aware pt is still here

## 2017-11-28 NOTE — ED Notes (Signed)
Pt called x1

## 2019-07-07 ENCOUNTER — Encounter (HOSPITAL_COMMUNITY): Payer: Self-pay | Admitting: Emergency Medicine

## 2019-07-07 ENCOUNTER — Emergency Department (HOSPITAL_COMMUNITY)
Admission: EM | Admit: 2019-07-07 | Discharge: 2019-07-07 | Disposition: A | Discharge status: Home / Self Care | Payer: Self-pay | Attending: Emergency Medicine | Admitting: Emergency Medicine

## 2019-07-07 ENCOUNTER — Other Ambulatory Visit: Payer: Self-pay

## 2019-07-07 ENCOUNTER — Emergency Department (HOSPITAL_COMMUNITY): Payer: Self-pay

## 2019-07-07 DIAGNOSIS — E46 Unspecified protein-calorie malnutrition: Secondary | ICD-10-CM | POA: Insufficient documentation

## 2019-07-07 DIAGNOSIS — B20 Human immunodeficiency virus [HIV] disease: Secondary | ICD-10-CM | POA: Insufficient documentation

## 2019-07-07 DIAGNOSIS — B370 Candidal stomatitis: Secondary | ICD-10-CM | POA: Insufficient documentation

## 2019-07-07 DIAGNOSIS — F17210 Nicotine dependence, cigarettes, uncomplicated: Secondary | ICD-10-CM | POA: Insufficient documentation

## 2019-07-07 DIAGNOSIS — B3781 Candidal esophagitis: Secondary | ICD-10-CM | POA: Insufficient documentation

## 2019-07-07 DIAGNOSIS — F640 Transsexualism: Secondary | ICD-10-CM | POA: Insufficient documentation

## 2019-07-07 HISTORY — DX: Other specified health status: Z78.9

## 2019-07-07 HISTORY — DX: Asymptomatic human immunodeficiency virus (hiv) infection status: Z21

## 2019-07-07 HISTORY — DX: Human immunodeficiency virus (HIV) disease: B20

## 2019-07-07 LAB — CBC WITH DIFFERENTIAL/PLATELET
Abs Immature Granulocytes: 0.08 10*3/uL — ABNORMAL HIGH (ref 0.00–0.07)
Basophils Absolute: 0 10*3/uL (ref 0.0–0.1)
Basophils Relative: 0 %
Eosinophils Absolute: 0 10*3/uL (ref 0.0–0.5)
Eosinophils Relative: 0 %
HCT: 35.2 % — ABNORMAL LOW (ref 39.0–52.0)
Hemoglobin: 11 g/dL — ABNORMAL LOW (ref 13.0–17.0)
Immature Granulocytes: 1 %
Lymphocytes Relative: 8 %
Lymphs Abs: 0.5 10*3/uL — ABNORMAL LOW (ref 0.7–4.0)
MCH: 29.8 pg (ref 26.0–34.0)
MCHC: 31.3 g/dL (ref 30.0–36.0)
MCV: 95.4 fL (ref 80.0–100.0)
Monocytes Absolute: 0.6 10*3/uL (ref 0.1–1.0)
Monocytes Relative: 9 %
Neutro Abs: 5.1 10*3/uL (ref 1.7–7.7)
Neutrophils Relative %: 82 %
Platelets: 523 10*3/uL — ABNORMAL HIGH (ref 150–400)
RBC: 3.69 MIL/uL — ABNORMAL LOW (ref 4.22–5.81)
RDW: 14.6 % (ref 11.5–15.5)
WBC: 6.2 10*3/uL (ref 4.0–10.5)
nRBC: 0 % (ref 0.0–0.2)

## 2019-07-07 LAB — COMPREHENSIVE METABOLIC PANEL
ALT: 11 U/L (ref 0–44)
AST: 26 U/L (ref 15–41)
Albumin: 2.3 g/dL — ABNORMAL LOW (ref 3.5–5.0)
Alkaline Phosphatase: 49 U/L (ref 38–126)
Anion gap: 12 (ref 5–15)
BUN: 7 mg/dL (ref 6–20)
CO2: 21 mmol/L — ABNORMAL LOW (ref 22–32)
Calcium: 8.5 mg/dL — ABNORMAL LOW (ref 8.9–10.3)
Chloride: 100 mmol/L (ref 98–111)
Creatinine, Ser: 0.86 mg/dL (ref 0.61–1.24)
GFR calc Af Amer: >60 mL/min (ref >60)
GFR calc non Af Amer: >60 mL/min (ref >60)
Glucose, Bld: 80 mg/dL (ref 70–99)
Potassium: 3.7 mmol/L (ref 3.5–5.1)
Sodium: 133 mmol/L — ABNORMAL LOW (ref 135–145)
Total Bilirubin: 0.3 mg/dL (ref 0.3–1.2)
Total Protein: 9.6 g/dL — ABNORMAL HIGH (ref 6.5–8.1)

## 2019-07-07 LAB — GROUP A STREP BY PCR: Group A Strep by PCR: NOT DETECTED

## 2019-07-07 LAB — LIPASE, BLOOD: Lipase: 31 U/L (ref 11–51)

## 2019-07-07 MED ORDER — FLUCONAZOLE 100 MG PO TABS: 100.0000 mg | ORAL_TABLET | Freq: Every day | ORAL | 0 refills | Status: AC

## 2019-07-07 MED ORDER — FLUCONAZOLE 100 MG PO TABS
200.0000 mg | ORAL_TABLET | Freq: Once | ORAL | Status: AC
  Administered 2019-07-07: 17:00:00 200 mg via ORAL

## 2019-07-07 MED ORDER — ONDANSETRON 4 MG PO TBDP
4.0000 mg | ORAL_TABLET | Freq: Once | ORAL | Status: AC
  Administered 2019-07-07: 15:00:00 4 mg via ORAL
  Filled 2019-07-07: qty 1

## 2019-07-07 MED ORDER — FLUCONAZOLE 100 MG PO TABS: 100.0000 mg | ORAL_TABLET | Freq: Once | ORAL | Status: DC

## 2019-07-07 MED ORDER — SODIUM CHLORIDE 0.9 % IV BOLUS
1000.0000 mL | Freq: Once | INTRAVENOUS | Status: AC
  Administered 2019-07-07: 1000 mL via INTRAVENOUS

## 2019-07-07 MED ORDER — ONDANSETRON 4 MG PO TBDP: 4.0000 mg | ORAL_TABLET | Freq: Three times a day (TID) | ORAL | 0 refills | Status: DC | PRN

## 2019-07-07 NOTE — ED Provider Notes (Signed)
[facility] EMERGENCY DEPARTMENT Provider Note   CSN: [id number] Arrival date & time: 07/07/19  1138    History   Chief Complaint Chief Complaint  Patient presents with   Sore Throat   Anorexia    HPI Tyler Vargas is a 33 y.o. adult assigned male gender at birth, identifies as male with a past medical history of HIV infection, not currently receiving any treatment, who presents today for evaluation of sore throat and decreased appetite over the past 2 weeks.  She reports that the symptoms have been worsening.  She denies any fevers.  She denies any abdominal pain.  She reports that in general her appetite has been poor.  She denies any cough.  She denies any rapid unintentional weight loss.  No dysuria, increased frequency or urgency.  She denies any known coronavirus contacts.  She denies any recent trauma.  She is not concerned about having any other sexually transmitted infections.  She has never been on antiretrovirals or seen by infectious disease.  She was diagnosed with HIV when she was in prison approximately 1 year ago.     HPI  Past Medical History:  Diagnosis Date   HIV (human immunodeficiency virus infection) ([place])    Male-to-male transgender person     Patient Active Problem List   Diagnosis Date Noted   Facial trauma 03/07/2014    Past Surgical History:  Procedure Laterality Date   APPENDECTOMY       OB History   No obstetric history on file.      Home Medications    Prior to Admission medications   Medication Sig Start Date End Date Taking? Authorizing Provider  diphenhydrAMINE (BENADRYL) 25 mg capsule Take 25 mg by mouth every 4 (four) hours as needed for itching.    [provider]  fluconazole (DIFLUCAN) 100 MG tablet Take 1 tablet (100 mg total) by mouth daily for 10 days. 07/07/19 07/17/19  Lorin Glass, PA-C  ibuprofen (ADVIL,MOTRIN) 600 MG tablet Take 1 tablet (600 mg total) by mouth every 6  (six) hours as needed. 04/17/14   Leonard Schwartz, MD  ibuprofen (ADVIL,MOTRIN) 800 MG tablet Take 1 tablet (800 mg total) by mouth 3 (three) times daily. 05/02/13   Delos Haring, PA-C  ibuprofen (ADVIL,MOTRIN) 800 MG tablet Take 1 tablet (800 mg total) by mouth 3 (three) times daily. 06/11/17   Mackuen, Courteney Lyn, MD  ondansetron (ZOFRAN ODT) 4 MG disintegrating tablet Take 1 tablet (4 mg total) by mouth every 8 (eight) hours as needed for nausea or vomiting. 07/07/19   Lorin Glass, PA-C  oxyCODONE-acetaminophen (PERCOCET) 5-325 MG per tablet Take 1-2 tablets by mouth every 6 (six) hours as needed for moderate pain. 03/07/14   Pamella Pert, MD  penicillin v potassium (VEETID) 500 MG tablet Take 1 tablet (500 mg total) by mouth 4 (four) times daily. 04/17/14   Leonard Schwartz, MD  predniSONE (DELTASONE) 10 MG tablet Take 100 mg or 10 tablets on days 1 and 2. Take 50 mg or 5 tablets on days 3 through 5. Take 20 mg or 2 tablets on days 6 through 9. 03/07/14   Pamella Pert, MD  sulfamethoxazole-trimethoprim (BACTRIM DS) 800-160 MG per tablet Take 1 tablet by mouth 2 (two) times daily. 05/02/13   Delos Haring, PA-C    Family History No family history on file.  Social History Social History   Tobacco Use   Smoking status: Current Every Day Smoker    Packs/day: 10.00  Types: Cigarettes   Smokeless tobacco: Never Used  Substance Use Topics   Alcohol use: Yes    Comment: weekends socially   Drug use: Yes    Types: Marijuana     Allergies   Patient has no known allergies.   Review of Systems Review of Systems  Constitutional: Positive for appetite change. Negative for activity change, chills, fatigue and fever.  HENT: Positive for mouth sores and sore throat. Negative for trouble swallowing and voice change.   Respiratory: Negative for chest tightness and shortness of breath.   Cardiovascular: Negative for chest pain.  Gastrointestinal: Negative for abdominal  pain, diarrhea, nausea and vomiting.  Genitourinary: Negative for dysuria and urgency.  Musculoskeletal: Negative for arthralgias, back pain and neck pain.  Skin: Negative for color change and rash.  Neurological: Negative for weakness and headaches.  Psychiatric/Behavioral: Positive for dysphoric mood. Negative for confusion. The patient is not nervous/anxious.   All other systems reviewed and are negative.    Physical Exam Updated Vital Signs BP (!) 123/91 (BP Location: Left Arm)    Pulse 97    Temp 99.6 F (37.6 C) (Oral)    Resp 14    Ht '5\' 8"'  (1.727 m)    Wt 56.7 kg    SpO2 98%    BMI 19.01 kg/m   Physical Exam Vitals signs and nursing note reviewed.  Constitutional:      Appearance: She is well-developed.  HENT:     Head: Normocephalic and atraumatic.     Right Ear: There is impacted cerumen.     Left Ear: There is impacted cerumen.     Nose: Nose normal.     Mouth/Throat:     Mouth: Mucous membranes are moist.     Comments: There are multiple small white patches on the posterior oropharynx. Eyes:     Extraocular Movements: Extraocular movements intact.     Conjunctiva/sclera: Conjunctivae normal.     Pupils: Pupils are equal, round, and reactive to light.  Neck:     Musculoskeletal: Neck supple.  Cardiovascular:     Rate and Rhythm: Normal rate and regular rhythm.     Pulses: Normal pulses.     Heart sounds: Normal heart sounds. No murmur.  Pulmonary:     Effort: Pulmonary effort is normal. No respiratory distress.     Breath sounds: Normal breath sounds. No stridor. No rhonchi.  Abdominal:     General: Abdomen is flat. Bowel sounds are normal. There is no distension.     Palpations: Abdomen is soft. There is no mass.     Tenderness: There is no abdominal tenderness. There is no guarding.  Musculoskeletal: Normal range of motion.     Left lower leg: No edema.  Skin:    General: Skin is warm and dry.  Neurological:     General: No focal deficit present.      Mental Status: She is alert and oriented to person, place, and time.  Psychiatric:        Mood and Affect: Mood normal. Affect is blunt.        Speech: Speech normal.        Behavior: Behavior normal.        Thought Content: Thought content does not include homicidal or suicidal ideation. Thought content does not include homicidal or suicidal plan.      ED Treatments / Results  Labs (all labs ordered are listed, but only abnormal results are displayed) Labs Reviewed  COMPREHENSIVE METABOLIC  PANEL - Abnormal; Notable for the following components:      Result Value   Sodium 133 (*)    CO2 21 (*)    Calcium 8.5 (*)    Total Protein 9.6 (*)    Albumin 2.3 (*)    All other components within normal limits  CBC WITH DIFFERENTIAL/PLATELET - Abnormal; Notable for the following components:   RBC 3.69 (*)    Hemoglobin 11.0 (*)    HCT 35.2 (*)    Platelets 523 (*)    Lymphs Abs 0.5 (*)    Abs Immature Granulocytes 0.08 (*)    All other components within normal limits  GROUP A STREP BY PCR  LIPASE, BLOOD  T-HELPER CELLS (CD4) COUNT (NOT AT [facility])  HIV-1 RNA, PCR (GRAPH) RFX/GENO EDI  HLA B*5701  QUANTIFERON-TB GOLD PLUS  RPR  HEPATITIS A ANTIBODY, TOTAL  HEPATITIS B SURFACE ANTIGEN  HEPATITIS B SURFACE ANTIBODY, QUANTITATIVE  HEPATITIS C ANTIBODY  GC/CHLAMYDIA PROBE AMP ([place]) NOT AT [facility]    EKG None  Radiology Dg Chest 1 View  Result Date: 07/07/2019 CLINICAL DATA:  Chronic cough, history of tobacco use EXAM: CHEST  1 VIEW COMPARISON:  03/07/2014 FINDINGS: The heart size and mediastinal contours are within normal limits. Both lungs are clear. The visualized skeletal structures are unremarkable. IMPRESSION: No active disease. Electronically Signed   By: Inez Catalina M.D.   On: 07/07/2019 14:55    Procedures Procedures (including critical care time)  Medications Ordered in ED Medications  ondansetron (ZOFRAN-ODT) disintegrating tablet 4 mg (4 mg Oral Given 07/07/19  1505)  sodium chloride 0.9 % bolus 1,000 mL (0 mLs Intravenous Stopped 07/07/19 1719)  fluconazole (DIFLUCAN) tablet 200 mg (200 mg Oral Given 07/07/19 1720)     Initial Impression / Assessment and Plan / ED Course  I have reviewed the triage vital signs and the nursing notes.  Pertinent labs & imaging results that were available during my care of the patient were reviewed by me and considered in my medical decision making (see chart for details).  Clinical Course as of Jul 06 2044  [facility] Jul 07, 2019  1430 Spoke with Dr. Megan Salon from infectious disease due to concern of untreated HIV.  He recommends if she does have oral thrush to start her on fluconazole 100 mg daily, and he will place orders for labs to help expedite her outpatient follow-up.   [EH]    Clinical Course User Index [EH] Lorin Glass, PA-C      Patient presents today for evaluation of sore throat and loss of appetite for approximately 1 week.  Strep test is negative.  On exam she has scattered white patches in the posterior oropharynx.  That combined with her untreated HIV and pain with swallowing are concerning for esophageal candidiasis.  She is afebrile, not tachycardic or tachypneic and generally well-appearing.  CBC was obtained showing mild anemia with decreased lymphocytes.  CMP shows low albumin, elevated total protein and low calcium.  Lipase is not elevated.  Given her reported history of HIV however not being on any treatment I spoke with infectious disease on-call Dr. Megan Salon who added an additional lab work that will assist in her establishing care with regional center for infectious disease and will be followed up on then.  Per recommendation of Dr. Megan Salon patient is started on p.o. Diflucan.  She is given a p.o. dose of 200 mg here and 1 week of 100 mg once a day.  She  is instructed not to drink alcohol while taking this medicine.  Currently her AST and ALT are normal.  Dr. Megan Salon will assist in  establishing her care at [facility]  Chest x-ray without evidence of pneumonia or other abnormality.  She discussed that she has had generally poor appetite recently which I suspect may be contributing to her low albumin.  We discussed the importance of adequate nutrition and hydration.  Return precautions were discussed with patient who states their understanding.  At the time of discharge patient denied any unaddressed complaints or concerns.  Patient is agreeable for discharge home.    Final Clinical Impressions(s) / ED Diagnoses   Final diagnoses:  Thrush of mouth and esophagus ([place])  Candidiasis of mouth with HIV infection ([place])  Malnutrition, unspecified type [facility])    ED Discharge Orders         Ordered    fluconazole (DIFLUCAN) 100 MG tablet  Daily     07/07/19 1713    ondansetron (ZOFRAN ODT) 4 MG disintegrating tablet  Every 8 hours PRN     07/07/19 1713           Lorin Glass, Hershal Coria 07/07/19 2047    Sherwood Gambler, MD 07/09/19 [phone]

## 2019-07-07 NOTE — ED Triage Notes (Signed)
Pt reports a sore throat and loss of appetite going on for a little over a week. Pt denies any COVID exposure.

## 2019-07-07 NOTE — Discharge Instructions (Addendum)
Please do not drink alcohol while taking the fluconazole which is the medicine to treat the yeast infection.  I have also given you a prescription for Zofran which is a nausea medicine.  If you develop fevers, worsening symptoms, or have additional concerns please seek additional medical care and evaluation.  Please start taking a daily multivitamin with iron.

## 2019-07-08 LAB — RPR, QUANT+TP ABS (REFLEX)
Rapid Plasma Reagin, Quant: 1:256 {titer} — ABNORMAL HIGH
T Pallidum Abs: REACTIVE — AB

## 2019-07-08 LAB — RPR: RPR Ser Ql: REACTIVE — AB

## 2019-07-08 LAB — HEPATITIS B SURFACE ANTIBODY, QUANTITATIVE: Hep B S AB Quant (Post): 330.6 m[IU]/mL (ref >9.9)

## 2019-07-08 LAB — T-HELPER CELLS (CD4) COUNT (NOT AT ARMC)
CD4 % Helper T Cell: 13 % — ABNORMAL LOW (ref 33–65)
CD4 T Cell Abs: 44 /uL — ABNORMAL LOW (ref 400–1790)

## 2019-07-08 LAB — HEPATITIS A ANTIBODY, TOTAL: hep A Total Ab: POSITIVE — AB

## 2019-07-08 LAB — HEPATITIS C ANTIBODY: HCV Ab: 0.1 s/co ratio (ref 0.0–0.9)

## 2019-07-08 LAB — HEPATITIS B SURFACE ANTIGEN: Hepatitis B Surface Ag: NEGATIVE

## 2019-07-09 LAB — QUANTIFERON-TB GOLD PLUS: QuantiFERON-TB Gold Plus: UNDETERMINED — AB

## 2019-07-09 LAB — QUANTIFERON-TB GOLD PLUS (RQFGPL)
QuantiFERON Mitogen Value: 0.38 IU/mL
QuantiFERON Nil Value: 0.08 IU/mL
QuantiFERON TB1 Ag Value: 0.08 IU/mL
QuantiFERON TB2 Ag Value: 0.08 IU/mL

## 2019-07-11 LAB — HIV-1 RNA, PCR (GRAPH) RFX/GENO EDI: HIV-1 RNA BY PCR: UNDETERMINED copies/mL

## 2019-07-14 LAB — HLA B*5701: HLA B 5701: NEGATIVE

## 2019-09-15 ENCOUNTER — Other Ambulatory Visit: Payer: Self-pay

## 2019-09-15 ENCOUNTER — Encounter: Payer: Self-pay | Admitting: Infectious Diseases

## 2019-09-15 ENCOUNTER — Ambulatory Visit: Payer: Self-pay

## 2019-09-15 DIAGNOSIS — Z79899 Other long term (current) drug therapy: Secondary | ICD-10-CM

## 2019-09-15 DIAGNOSIS — Z113 Encounter for screening for infections with a predominantly sexual mode of transmission: Secondary | ICD-10-CM

## 2019-09-15 DIAGNOSIS — B20 Human immunodeficiency virus [HIV] disease: Secondary | ICD-10-CM

## 2019-09-16 ENCOUNTER — Telehealth: Payer: Self-pay

## 2019-09-16 LAB — URINALYSIS
Bilirubin Urine: NEGATIVE
Glucose, UA: NEGATIVE
Hgb urine dipstick: NEGATIVE
Ketones, ur: NEGATIVE
Nitrite: POSITIVE — AB
Specific Gravity, Urine: 1.013 (ref 1.001–1.03)
pH: 7.5 (ref 5.0–8.0)

## 2019-09-16 LAB — T-HELPER CELL (CD4) - (RCID CLINIC ONLY)
CD4 % Helper T Cell: 13 % — ABNORMAL LOW (ref 33–65)
CD4 T Cell Abs: 38 /uL — ABNORMAL LOW (ref 400–1790)

## 2019-09-16 NOTE — Telephone Encounter (Signed)
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 Can someone please give Tyler Vargas a call to ask about any recent treatment for syphilis? Any symptoms of rash/ulcers, vision changes, headaches?  If no previous treatment will have him come in and start bicillin injection series - will need to contact HD to see if any previous RPRs on file to see if he needs just one or 3.  Please verify allergies on the call as well if you can reach [place].   Thank you!

## 2019-09-16 NOTE — Progress Notes (Signed)
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 Can someone please give Tyler Vargas a call to ask about any recent treatment for syphilis? Any symptoms of rash/ulcers, vision changes, headaches?  If no previous treatment will have him come in and start bicillin injection series - will need to contact HD to see if any previous RPRs on file to see if he needs just one or 3.  Please verify allergies on the call as well if you can reach [place].   Thank you!

## 2019-09-16 NOTE — Telephone Encounter (Signed)
Per [facility] had titer of  1:128 in Feb. 2017- no treatment on file 1:128 in Jan 2009 was treated with x3 bicillin injections 1:256 in Aug 2020- no treatment on file.   Unable to reach patient, phone number states a different name on the voicemail. Tyler Vargas

## 2019-09-17 LAB — URINE CYTOLOGY ANCILLARY ONLY
Chlamydia: NEGATIVE
Comment: NEGATIVE
Comment: NORMAL
Neisseria Gonorrhea: NEGATIVE

## 2019-09-18 NOTE — Telephone Encounter (Signed)
Thanks for trying to clean up.  Based on the health department's history he will need 3 injections again.  Hopefully he will make his appointment on the 10th with me.  If not we will need to refer to [facility].

## 2019-09-29 ENCOUNTER — Other Ambulatory Visit: Payer: Self-pay

## 2019-09-30 ENCOUNTER — Other Ambulatory Visit: Payer: Self-pay

## 2019-09-30 ENCOUNTER — Ambulatory Visit (INDEPENDENT_AMBULATORY_CARE_PROVIDER_SITE_OTHER): Payer: Self-pay | Admitting: Infectious Diseases

## 2019-09-30 ENCOUNTER — Encounter: Payer: Self-pay | Admitting: Infectious Diseases

## 2019-09-30 ENCOUNTER — Ambulatory Visit (INDEPENDENT_AMBULATORY_CARE_PROVIDER_SITE_OTHER): Payer: Self-pay | Admitting: Pharmacist

## 2019-09-30 VITALS — BP 121/72 | HR 114 | Temp 98.7°F | Wt 126.6 lb

## 2019-09-30 DIAGNOSIS — A528 Late syphilis, latent: Secondary | ICD-10-CM

## 2019-09-30 DIAGNOSIS — F640 Transsexualism: Secondary | ICD-10-CM

## 2019-09-30 DIAGNOSIS — Z23 Encounter for immunization: Secondary | ICD-10-CM

## 2019-09-30 DIAGNOSIS — B20 Human immunodeficiency virus [HIV] disease: Secondary | ICD-10-CM

## 2019-09-30 DIAGNOSIS — Z8619 Personal history of other infectious and parasitic diseases: Secondary | ICD-10-CM

## 2019-09-30 DIAGNOSIS — A630 Anogenital (venereal) warts: Secondary | ICD-10-CM

## 2019-09-30 DIAGNOSIS — F329 Major depressive disorder, single episode, unspecified: Secondary | ICD-10-CM

## 2019-09-30 DIAGNOSIS — L409 Psoriasis, unspecified: Secondary | ICD-10-CM | POA: Insufficient documentation

## 2019-09-30 DIAGNOSIS — Z789 Other specified health status: Secondary | ICD-10-CM | POA: Insufficient documentation

## 2019-09-30 DIAGNOSIS — F32A Depression, unspecified: Secondary | ICD-10-CM | POA: Insufficient documentation

## 2019-09-30 HISTORY — DX: Personal history of other infectious and parasitic diseases: Z86.19

## 2019-09-30 LAB — CBC WITH DIFFERENTIAL/PLATELET
Absolute Monocytes: 270 cells/uL (ref 200–950)
Basophils Absolute: 12 cells/uL (ref 0–200)
Basophils Relative: 0.2 %
Eosinophils Absolute: 0 cells/uL — ABNORMAL LOW (ref 15–500)
Eosinophils Relative: 0 %
HCT: 25.7 % — ABNORMAL LOW (ref 38.5–50.0)
Hemoglobin: 8.4 g/dL — ABNORMAL LOW (ref 13.2–17.1)
Lymphs Abs: 396 cells/uL — ABNORMAL LOW (ref 850–3900)
MCH: 28.5 pg (ref 27.0–33.0)
MCHC: 32.7 g/dL (ref 32.0–36.0)
MCV: 87.1 fL (ref 80.0–100.0)
MPV: 10.1 fL (ref 7.5–12.5)
Monocytes Relative: 4.5 %
Neutro Abs: 5322 cells/uL (ref 1500–7800)
Neutrophils Relative %: 88.7 %
Platelets: 657 10*3/uL — ABNORMAL HIGH (ref 140–400)
RBC: 2.95 10*6/uL — ABNORMAL LOW (ref 4.20–5.80)
RDW: 13.5 % (ref 11.0–15.0)
Total Lymphocyte: 6.6 %
WBC: 6 10*3/uL (ref 3.8–10.8)

## 2019-09-30 LAB — QUANTIFERON-TB GOLD PLUS
Mitogen-NIL: <0 IU/mL
NIL: 0.06 IU/mL
QuantiFERON-TB Gold Plus: UNDETERMINED — AB
TB1-NIL: <0 IU/mL
TB2-NIL: <0 IU/mL

## 2019-09-30 LAB — RPR TITER: RPR Titer: 1:128 {titer} — ABNORMAL HIGH

## 2019-09-30 LAB — COMPLETE METABOLIC PANEL WITH GFR
AG Ratio: 0.5 (calc) — ABNORMAL LOW (ref 1.0–2.5)
ALT: 7 U/L — ABNORMAL LOW (ref 9–46)
AST: 20 U/L (ref 10–40)
Albumin: 3 g/dL — ABNORMAL LOW (ref 3.6–5.1)
Alkaline phosphatase (APISO): 46 U/L (ref 36–130)
BUN: 8 mg/dL (ref 7–25)
CO2: 24 mmol/L (ref 20–32)
Calcium: 8 mg/dL — ABNORMAL LOW (ref 8.6–10.3)
Chloride: 100 mmol/L (ref 98–110)
Creat: 0.79 mg/dL (ref 0.60–1.35)
GFR, Est African American: 137 mL/min/{1.73_m2} (ref >=60)
GFR, Est Non African American: 118 mL/min/{1.73_m2} (ref >=60)
Globulin: 5.7 g/dL (calc) — ABNORMAL HIGH (ref 1.9–3.7)
Glucose, Bld: 120 mg/dL — ABNORMAL HIGH (ref 65–99)
Potassium: 4.2 mmol/L (ref 3.5–5.3)
Sodium: 135 mmol/L (ref 135–146)
Total Bilirubin: 0.2 mg/dL (ref 0.2–1.2)
Total Protein: 8.7 g/dL — ABNORMAL HIGH (ref 6.1–8.1)

## 2019-09-30 LAB — FLUORESCENT TREPONEMAL AB(FTA)-IGG-BLD: Fluorescent Treponemal ABS: REACTIVE — AB

## 2019-09-30 LAB — LIPID PANEL
Cholesterol: 98 mg/dL (ref <200)
HDL: 22 mg/dL — ABNORMAL LOW (ref >=40)
LDL Cholesterol (Calc): 55 mg/dL (calc)
Non-HDL Cholesterol (Calc): 76 mg/dL (calc) (ref <130)
Total CHOL/HDL Ratio: 4.5 (calc) (ref <5.0)
Triglycerides: 130 mg/dL (ref <150)

## 2019-09-30 LAB — HIV-1 RNA ULTRAQUANT REFLEX TO GENTYP+
HIV 1 RNA Quant: 1400000 copies/mL — ABNORMAL HIGH
HIV-1 RNA Quant, Log: 6.15 Log copies/mL — ABNORMAL HIGH

## 2019-09-30 LAB — HEPATITIS C ANTIBODY
Hepatitis C Ab: NONREACTIVE
SIGNAL TO CUT-OFF: 0.38 (ref <1.00)

## 2019-09-30 LAB — RPR: RPR Ser Ql: REACTIVE — AB

## 2019-09-30 LAB — HLA B*5701: HLA-B*5701 w/rflx HLA-B High: NEGATIVE

## 2019-09-30 LAB — HIV-1 GENOTYPE: HIV-1 Genotype: DETECTED — AB

## 2019-09-30 LAB — HIV-1/2 AB - DIFFERENTIATION
HIV-1 antibody: POSITIVE — AB
HIV-2 Ab: NEGATIVE

## 2019-09-30 LAB — HEPATITIS B CORE ANTIBODY, TOTAL: Hep B Core Total Ab: NONREACTIVE

## 2019-09-30 LAB — HEPATITIS B SURFACE ANTIBODY,QUALITATIVE: Hep B S Ab: REACTIVE — AB

## 2019-09-30 LAB — HEPATITIS B SURFACE ANTIGEN: Hepatitis B Surface Ag: NONREACTIVE

## 2019-09-30 LAB — HEPATITIS A ANTIBODY, TOTAL: Hepatitis A AB,Total: NONREACTIVE

## 2019-09-30 LAB — HIV ANTIBODY (ROUTINE TESTING W REFLEX): HIV 1&2 Ab, 4th Generation: REACTIVE — AB

## 2019-09-30 MED ORDER — AZITHROMYCIN 600 MG PO TABS: 1200.0000 mg | ORAL_TABLET | ORAL | 11 refills | Status: DC

## 2019-09-30 MED ORDER — FLUCONAZOLE 200 MG PO TABS: 200.0000 mg | ORAL_TABLET | Freq: Every day | ORAL | 0 refills | Status: DC

## 2019-09-30 MED ORDER — SULFAMETHOXAZOLE-TRIMETHOPRIM 800-160 MG PO TABS: 1.0000 | ORAL_TABLET | Freq: Every day | ORAL | 5 refills | Status: DC

## 2019-09-30 MED ORDER — PENICILLIN G BENZATHINE 1200000 UNIT/2ML IM SUSP
1.2000 10*6.[IU] | Freq: Once | INTRAMUSCULAR | Status: AC
  Administered 2019-09-30: 1.2 10*6.[IU] via INTRAMUSCULAR

## 2019-09-30 MED ORDER — BICTEGRAVIR-EMTRICITAB-TENOFOV 50-200-25 MG PO TABS: 1.0000 | ORAL_TABLET | Freq: Every day | ORAL | 5 refills | Status: DC

## 2019-09-30 NOTE — Assessment & Plan Note (Signed)
CD4 count is 38 and presents with likely esophageal candidiasis. Will start Azithromycin 1200 mg Qweek, bactrim 1 DS daily, and fluconazole

## 2019-09-30 NOTE — Assessment & Plan Note (Signed)
She has no findings on exam or history that would suggest neurologic or occular involvement. High titer 1:128. No rashes or ulcers present. Will treat accordingly with 3 sequential weekly IM shots of bicillin 2.4 million units. We will help arrange transportation to these appointments to ensure she is properly treated and coordinate with visits with Marcie Bal.

## 2019-09-30 NOTE — Assessment & Plan Note (Signed)
She was tearful in discussion today and confided in me she struggles with depressed mood and apathy towards things that once brought her joy. The diagnosis of AIDS was also shocking for her. She is interested in meeting with Marcie Bal regularly for psychosocial support. Will work on coordinating this for her with her bicillin injections given transportation is challenging for her.

## 2019-09-30 NOTE — Assessment & Plan Note (Signed)
She presents as male and prefers she/her/hers pronouns.  Not currently on gender affirming hormones but she is very interested in starting these. Will be able to help get her started on these after we prioritize her HIV/AIDS treatment. She is happy and hopeful of this plan.

## 2019-09-30 NOTE — Progress Notes (Signed)
HPI: Tyler Vargas is a 33 y.o. adult who presents to the [facility] clinic today to initiate care with Judeth Cornfield for her newly diagnosed HIV infection.  Patient Active Problem List   Diagnosis Date Noted  . Psoriasis 09/30/2019  . 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 HIV (human immunodeficiency virus infection) (HCC) 09/30/2019  . AIDS (acquired immune deficiency syndrome) (HCC) 09/30/2019  . 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 Male-to-male transgender person 09/30/2019    Patient's Medications  New Prescriptions   No medications on file  Previous Medications   AZITHROMYCIN (ZITHROMAX) 600 MG TABLET    Take 2 tablets (1,200 mg total) by mouth once a week.   BICTEGRAVIR-EMTRICITABINE-TENOFOVIR AF (BIKTARVY) 50-200-25 MG TABS TABLET    Take 1 tablet by mouth daily. Try to take at the same time each day with or without food.   FLUCONAZOLE (DIFLUCAN) 200 MG TABLET    Take 1 tablet (200 mg total) by mouth daily.   SULFAMETHOXAZOLE-TRIMETHOPRIM (BACTRIM DS) 800-160 MG TABLET    Take 1 tablet by mouth daily.  Modified Medications   No medications on file  Discontinued Medications   No medications on file    Allergies: No Known Allergies  Past Medical History: Past Medical History:  Diagnosis Date  . HIV (human immunodeficiency virus infection) (HCC)   . Male-to-male transgender person     Social History: Social History   Socioeconomic History  . Marital status: Single    Spouse name: Not on file  . Number of children: Not on file  . Years of education: Not on file  . Highest education level: Not on file  Occupational History  . Not on file  Social Needs  . Financial resource strain: Not on file  . Food insecurity    Worry: Not on file    Inability: Not on file  . Transportation needs    Medical: Not on file    Non-medical: Not on file  Tobacco Use  . Smoking status: Current Every Day Smoker    Packs/day: 10.00    Types: Cigarettes  . Smokeless tobacco: Never Used  .  Tobacco comment: cutting back  Substance and Sexual Activity  . Alcohol use: Yes    Comment: every 2-3 days; beer, vodka  . Drug use: Yes    Types: Marijuana    Comment: every other day  . Sexual activity: Not Currently    Partners: Male    Comment: offered condoms 09/2019  Lifestyle  . Physical activity    Days per week: Not on file    Minutes per session: Not on file  . Stress: Not on file  Relationships  . Social Musician on phone: Not on file    Gets together: Not on file    Attends religious service: Not on file    Active member of club or organization: Not on file    Attends meetings of clubs or organizations: Not on file    Relationship status: Not on file  Other Topics Concern  . Not on file  Social History Narrative   ** Merged History Encounter **        Labs: Lab Results  Component Value Date   CD4TABS 38 (L) 09/15/2019   CD4TABS 44 (L) 07/07/2019    RPR and STI Lab Results  Component Value Date   LABRPR REACTIVE (A) 09/15/2019   LABRPR Reactive (A) 07/07/2019   RPRTITER 1:128 (H) 09/15/2019    STI Results GC CT  09/15/2019 Negative Negative    Hepatitis B Lab Results  Component Value Date   HEPBSAB REACTIVE (A) 09/15/2019   HEPBSAG NON-REACTIVE 09/15/2019   HEPBCAB NON-REACTIVE 09/15/2019   Hepatitis C Lab Results  Component Value Date   HEPCAB NON-REACTIVE 09/15/2019   Hepatitis A Lab Results  Component Value Date   HAV NON-REACTIVE 09/15/2019   Lipids: Lab Results  Component Value Date   CHOL 98 09/15/2019   TRIG 130 09/15/2019   HDL 22 (L) 09/15/2019   CHOLHDL 4.5 09/15/2019   LDLCALC 55 09/15/2019    Current HIV Regimen: Treatment naive  Assessment: Darious is here today to initiate care with [name] for her newly diagnosed HIV infection.  [place] is treatment naive with an initial HIV viral load still in process and a CD4 count of 38.  Resistance mutations are still pending on initial genotype. Will start  patient on [place].  Furious was counseled that [place] Odor is a very common medication we use in a lot of HIV patients because it is so effective and well tolerated. Some side effects she may experience include headache, nausea or abdominal pain. If she does experience any of these she can use over the counter pain relievers or try taking her medications with food. She may take [place] with or without food and it was recommended she take her medication at the same time everyday to help ensure she is adherent. She is also being started on bactrim, fluconazole and azithromycin. I told her that we frequently start patients on these medication to try and prevent common infections in people with weakened immune systems and that as her immune system respond to the [place] we can hopefully stop some of these medications. She was also informed of the common side effects of these medications, the predominant ones being nausea/vomiting and that she can try taking them with food if that happens. She was also told you monitor for any rashes or any reaction she feels is out of the ordinary and to give [place] a call if this happens.   Plan: Initiate Biktarvy, Bactriim, fluconazole and azithromycin Follow up with [name] 10/30/2019   Nicoletta Dress, PharmD PGY2 Infectious Disease Pharmacy Resident  [facility] 09/30/2019, 9:58 AM

## 2019-09-30 NOTE — Assessment & Plan Note (Addendum)
New patient here to establish for HIV care. She is treatment naive and +AIDS with CD4 38.   I discussed with Tyler Vargas treatment options/side effects, benefits of treatment and long-term outcomes. I discussed how HIV is transmitted and the process of untreated HIV including increased risk for opportunistic infections, cancer, dementia and renal failure. Patient was counseled on routine HIV care including medication adherence, blood monitoring, necessary vaccines and follow up visits. Counseled regarding safe sex practices including: condom use, partner disclosure, limiting partners. Patient spent time talking with our pharmacist Dorothea Ogle regarding successful practices of ART and understands to reach out to our clinic in the future with questions.   Will start [place] for HIV treatment. she will receive medications from ADAP approved pharmacy. Will return to clinic in 4 weeks to review lab work and continue HIV education. Discussed interval for re-application today and services provided on [place] [facility].   General introduction to our clinic and integrated services. Will get her to meet Marcie Bal next week and introduced her to [facility] today. Dental referral placed today for [facility]. Information to schedule appointment completed today.   I spent greater than 45 minutes with the patient today. Greater than 50% of the time spent face-to-face counseling and coordination of care re: HIV and health maintenance.

## 2019-09-30 NOTE — Patient Instructions (Addendum)
It was wonderful to meet you today and I look forward to working with you.    For your HIV treatment:   Phillips Odor is the pill I would like for you to start taking to treat you - this will need to be taken once a day around the same time.  - Common side effects for a short time frame usually include headaches, nausea and diarrhea - OK to take over the counter tylenol for headaches and imodium for diarrhea - Try taking with food if you are nauseated  -If you take any multivitamins or supplements please separate them from your Biktarvy by 6 hours before and after.  The main thing is do not have them in the stomach at the same time.  Bactrim (small white pill) once a day for your immune system. Please continue this until we tell you to stop. You will need to refill this.   Azithromycin two pills once a week - continue this until we tell you to stop. You will need to refill this   Fluconazole (small pink pill) - this is for your thrush. Take this once a day until gone (21 days).    We gave you your flu shot today.   We also gave you your first shot for syphilis treatment. Please come back next Thursday for your 2nd and then Monday the 30th for your final shot.   We have arranged for you to meet with Marcie Bal our counselor next Thursday also.    Please come back to see [name] again in 4 weeks to check in.    Please sign up with MyChart to access your labs and set up email communication with our clinic for non-urgent medical concerns.

## 2019-09-30 NOTE — Progress Notes (Addendum)
Name: Tyler Vargas  DOB: Jan 08, 1986 MRN: [id number] PCP: Patient, No Pcp Per    Patient Active Problem List   Diagnosis Date Noted  . HIV (human immunodeficiency virus infection) ([place]) 09/30/2019    Priority: High  . AIDS (acquired immune deficiency syndrome) ([place]) 09/30/2019    Priority: High  . 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 Psoriasis 09/30/2019  . 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 Male-to-male transgender person 09/30/2019  . Depression 09/30/2019     Brief Narrative:  Tyler Vargas is a 33 y.o. adult is a 33 y.o. transfemale with HIV disease, Dx 12-2015 during incarceration. CD4 nadir 38 VL pending HIV Risk: transfemale/MSM History of OIs: esophageal candidiasis  Intake Labs 08/2019: Hep B sAg (-), sAb (+), cAb (-); Hep A (-), Hep C (-) Quantiferon (inteterminant) HLA B*5701 (-) G6PD: () Toxo IgG: ()   Previous Regimens: . Naive   Genotypes: . 08/2019 - pending   Subjective:   Chief Complaint  Patient presents with  . New Patient (Initial Visit)    offered condoms, flu shot; reports that she wasn't able to afford the medication prescribed for thrush diagnosis 3 months ago; reports severe sore throat     HPI: Tyler Vargas is here for her first visit to HIV care. She was tested during incarceration about 1 year ago and has never had treatment before. Prior to this last negative HIV test was > 5 years before that. (Per health department had + syphilis in 2009, ?if she was tested then).  She is a transfemale and has had a high number of male sexual partners in the past, most of which was was unprotected receptive anal intercourse. She has had syphilis in the past but no other STIs from her knowledge. She has no symptoms today that would suggest GU infection and denies any dysuria, abdominal pain or flank pain.  She has had weight loss of about 40 lbs, most of which in the last year. She had a normal appetite up until a few months ago when she  first started with a bad sore throat and white patches in her mouth. She went to the ER for this but was unable to afford the fungal medication. She has trouble chewing food and trouble with swallowing food due to pain.   She has a history of syphilis treatment last per HD in 2009. + VDRL with 1:256 titer this August and never treated. Presently his titer is 1:128. He is having no vision changes/loss/blurred vision, no neck pain, headaches, tinnitus, dizziness.   She does use cocaine regularly and drinks alcohol socially without binge behaviors. She smokes cigarettes regularly as well but not as much recently with her sore throat and cough. She feels that she is having trouble with depression as she has noticed she is "not herself' and often feels withdrawn from activities that used to bring her joy. She also wants to sleep all the time. She denies any hopelessness or   She does not have a PCP. No other chronic health conditions that are known to her at this time outside of psoriasis.   ROS  Past Medical History:  Diagnosis Date  . HIV (human immunodeficiency virus infection) ([place])   . Male-to-male transgender person     Outpatient Medications Prior to Visit  Medication Sig Dispense Refill  . cephALEXin (KEFLEX) 500 MG capsule Take by mouth.    . diphenhydrAMINE (BENADRYL) 25 mg capsule Take 25 mg by mouth every 4 (four)  hours as needed for itching.    Marland Kitchen ibuprofen (ADVIL,MOTRIN) 600 MG tablet Take 1 tablet (600 mg total) by mouth every 6 (six) hours as needed. 30 tablet 0  . ibuprofen (ADVIL,MOTRIN) 800 MG tablet Take 1 tablet (800 mg total) by mouth 3 (three) times daily. 21 tablet 0  . ibuprofen (ADVIL,MOTRIN) 800 MG tablet Take 1 tablet (800 mg total) by mouth 3 (three) times daily. 21 tablet 0  . ondansetron (ZOFRAN ODT) 4 MG disintegrating tablet Take 1 tablet (4 mg total) by mouth every 8 (eight) hours as needed for nausea or vomiting. 10 tablet 0  . oxyCODONE-acetaminophen (PERCOCET)  5-325 MG per tablet Take 1-2 tablets by mouth every 6 (six) hours as needed for moderate pain. 40 tablet 0  . penicillin v potassium (VEETID) 500 MG tablet Take 1 tablet (500 mg total) by mouth 4 (four) times daily. 30 tablet 0  . predniSONE (DELTASONE) 10 MG tablet Take 100 mg or 10 tablets on days 1 and 2. Take 50 mg or 5 tablets on days 3 through 5. Take 20 mg or 2 tablets on days 6 through 9. 43 tablet 0  . sulfamethoxazole-trimethoprim (BACTRIM DS) 800-160 MG per tablet Take 1 tablet by mouth 2 (two) times daily. 14 tablet 0   No facility-administered medications prior to visit.      No Known Allergies  Social History   Tobacco Use  . Smoking status: Current Every Day Smoker    Packs/day: 10.00    Types: Cigarettes  . Smokeless tobacco: Never Used  . Tobacco comment: cutting back  Substance Use Topics  . Alcohol use: Yes    Comment: every 2-3 days; beer, vodka  . Drug use: Yes    Types: Marijuana    Comment: every other day    Family History  Problem Relation Age of Onset  . Cancer Neg Hx   . Diabetes Neg Hx     Social History   Substance and Sexual Activity  Sexual Activity Not Currently  . Partners: Male   Comment: offered condoms 09/2019     Objective:   Vitals:   09/30/19 0839  BP: 121/72  Pulse: (!) 114  Temp: 98.7 F (37.1 C)  TempSrc: Oral  Weight: 126 lb 9.6 oz (57.4 kg)   Body mass index is 19.25 kg/m.  Physical Exam Vitals signs reviewed.  Constitutional:      Appearance: She is well-developed.     Comments: Seated comfortably in chair during visit. Thin, well developed in no distress.   HENT:     Nose: No congestion.     Mouth/Throat:     Dentition: Normal dentition. No dental abscesses.     Pharynx: Oropharyngeal exudate (white plaques throughout buccal mucosa, tongue and extending down the soft palate to throat. ) present.  Eyes:     General: No visual field deficit or scleral icterus.    Pupils: Pupils are equal, round, and  reactive to light.  Cardiovascular:     Rate and Rhythm: Normal rate and regular rhythm.     Heart sounds: Normal heart sounds. No murmur.  Pulmonary:     Effort: Pulmonary effort is normal.     Breath sounds: Normal breath sounds.  Abdominal:     General: There is no distension.     Palpations: Abdomen is soft.     Tenderness: There is no abdominal tenderness.  Musculoskeletal: Normal range of motion.  Lymphadenopathy:     Cervical: No cervical adenopathy.  Skin:    General: Skin is warm and dry.     Capillary Refill: Capillary refill takes less than 2 seconds.     Findings: No rash.  Neurological:     Mental Status: She is alert and oriented to person, place, and time.     Cranial Nerves: No cranial nerve deficit.     Sensory: Sensation is intact.     Motor: Motor function is intact.     Coordination: Coordination is intact.     Gait: Gait is intact.  Psychiatric:        Judgment: Judgment normal.     Comments: In good spirits today and engaged in care discussion.      Lab Results Lab Results  Component Value Date   WBC 6.0 09/15/2019   HGB 8.4 (L) 09/15/2019   HCT 25.7 (L) 09/15/2019   MCV 87.1 09/15/2019   PLT 657 (H) 09/15/2019    Lab Results  Component Value Date   CREATININE 0.79 09/15/2019   BUN 8 09/15/2019   NA 135 09/15/2019   K 4.2 09/15/2019   CL 100 09/15/2019   CO2 24 09/15/2019    Lab Results  Component Value Date   ALT 7 (L) 09/15/2019   AST 20 09/15/2019   ALKPHOS 49 07/07/2019   BILITOT 0.2 09/15/2019    Lab Results  Component Value Date   CHOL 98 09/15/2019   HDL 22 (L) 09/15/2019   LDLCALC 55 09/15/2019   TRIG 130 09/15/2019   CHOLHDL 4.5 09/15/2019   CD4 T Cell Abs (/uL)  Date Value  09/15/2019 38 (L)  07/07/2019 44 (L)     Assessment & Plan:   Problem List Items Addressed This Visit      High   HIV (human immunodeficiency virus infection) ([place]) - Primary (Chronic)    New patient here to establish for HIV care. She is  treatment naive and +AIDS with CD4 38.   I discussed with Carmelina Peal treatment options/side effects, benefits of treatment and long-term outcomes. I discussed how HIV is transmitted and the process of untreated HIV including increased risk for opportunistic infections, cancer, dementia and renal failure. Patient was counseled on routine HIV care including medication adherence, blood monitoring, necessary vaccines and follow up visits. Counseled regarding safe sex practices including: condom use, partner disclosure, limiting partners. Patient spent time talking with our pharmacist Dorothea Ogle regarding successful practices of ART and understands to reach out to our clinic in the future with questions.   Will start [place] for HIV treatment. she will receive medications from ADAP approved pharmacy. Will return to clinic in 4 weeks to review lab work and continue HIV education. Discussed interval for re-application today and services provided on [place] [facility].   General introduction to our clinic and integrated services. Will get her to meet Marcie Bal next week and introduced her to [facility] today. Dental referral placed today for [facility]. Information to schedule appointment completed today.   I spent greater than 45 minutes with the patient today. Greater than 50% of the time spent face-to-face counseling and coordination of care re: HIV and health maintenance.        Relevant Medications   fluconazole (DIFLUCAN) 200 MG tablet   bictegravir-emtricitabine-tenofovir AF (BIKTARVY) 50-200-25 MG TABS tablet   azithromycin (ZITHROMAX) 600 MG tablet   sulfamethoxazole-trimethoprim (BACTRIM DS) 800-160 MG tablet   AIDS (acquired immune deficiency syndrome) ([facility])    CD4 count is 38 and presents with likely esophageal candidiasis. Will  start Azithromycin 1200 mg Qweek, bactrim 1 DS daily, and fluconazole       Relevant Medications   fluconazole (DIFLUCAN) 200 MG tablet   bictegravir-emtricitabine-tenofovir AF  (BIKTARVY) 50-200-25 MG TABS tablet   azithromycin (ZITHROMAX) 600 MG tablet   sulfamethoxazole-trimethoprim (BACTRIM DS) 800-160 MG tablet     Medium   Late latent syphilis    She has no findings on exam or history that would suggest neurologic or occular involvement. High titer 1:128. No rashes or ulcers present. Will treat accordingly with 3 sequential weekly IM shots of bicillin 2.4 million units. We will help arrange transportation to these appointments to ensure she is properly treated and coordinate with visits with Marcie Bal.       Relevant Medications   fluconazole (DIFLUCAN) 200 MG tablet   bictegravir-emtricitabine-tenofovir AF (BIKTARVY) 50-200-25 MG TABS tablet   azithromycin (ZITHROMAX) 600 MG tablet   sulfamethoxazole-trimethoprim (BACTRIM DS) 800-160 MG tablet     Unprioritized   Anal condyloma    Would like to get her into ANCHOR trial, however she is just under the age. I will call to see if there is any exception with regards to her risk factors. Alternatively will need referral to [facility] for consideration of excision and anoscopy to evaluate for rectal cancer.       Relevant Medications   fluconazole (DIFLUCAN) 200 MG tablet   bictegravir-emtricitabine-tenofovir AF (BIKTARVY) 50-200-25 MG TABS tablet   azithromycin (ZITHROMAX) 600 MG tablet   sulfamethoxazole-trimethoprim (BACTRIM DS) 800-160 MG tablet   Male-to-male transgender person    She presents as male and prefers she/her/hers pronouns.  Not currently on gender affirming hormones but she is very interested in starting these. Will be able to help get her started on these after we prioritize her HIV/AIDS treatment. She is happy and hopeful of this plan.       Depression    She was tearful in discussion today and confided in me she struggles with depressed mood and apathy towards things that once brought her joy. The diagnosis of AIDS was also shocking for her. She is interested in meeting with  Marcie Bal regularly for psychosocial support. Will work on coordinating this for her with her bicillin injections given transportation is challenging for her.        Other Visit Diagnoses    Need for immunization against influenza       Relevant Orders   Flu Vaccine QUAD 36+ mos IM (Completed)      [name], MSN, NP-C [facility] Pager: [phone] Office: [phone]  09/30/19  3:55 PM

## 2019-09-30 NOTE — Assessment & Plan Note (Signed)
Would like to get her into ANCHOR trial, however she is just under the age. I will call to see if there is any exception with regards to her risk factors. Alternatively will need referral to [facility] for consideration of excision and anoscopy to evaluate for rectal cancer.

## 2019-10-09 ENCOUNTER — Ambulatory Visit (INDEPENDENT_AMBULATORY_CARE_PROVIDER_SITE_OTHER): Payer: Self-pay | Admitting: *Deleted

## 2019-10-09 ENCOUNTER — Other Ambulatory Visit: Payer: Self-pay

## 2019-10-09 ENCOUNTER — Ambulatory Visit: Payer: Self-pay

## 2019-10-09 DIAGNOSIS — A528 Late syphilis, latent: Secondary | ICD-10-CM

## 2019-10-09 MED ORDER — PENICILLIN G BENZATHINE 1200000 UNIT/2ML IM SUSP
1.2000 10*6.[IU] | Freq: Once | INTRAMUSCULAR | Status: AC
  Administered 2019-10-09: 1.2 10*6.[IU] via INTRAMUSCULAR

## 2019-10-09 NOTE — Progress Notes (Signed)
RN connected patient to [facility] for intake after injections. Landis Gandy, RN

## 2019-10-20 ENCOUNTER — Ambulatory Visit (INDEPENDENT_AMBULATORY_CARE_PROVIDER_SITE_OTHER): Payer: Self-pay

## 2019-10-20 ENCOUNTER — Other Ambulatory Visit: Payer: Self-pay

## 2019-10-20 DIAGNOSIS — A528 Late syphilis, latent: Secondary | ICD-10-CM

## 2019-10-20 DIAGNOSIS — A630 Anogenital (venereal) warts: Secondary | ICD-10-CM

## 2019-10-20 MED ORDER — PENICILLIN G BENZATHINE 1200000 UNIT/2ML IM SUSP
1.2000 10*6.[IU] | Freq: Once | INTRAMUSCULAR | Status: AC
  Administered 2019-10-20: 1.2 10*6.[IU] via INTRAMUSCULAR

## 2019-10-20 MED ORDER — PENICILLIN G BENZATHINE 1200000 UNIT/2ML IM SUSP: 1.2000 10*6.[IU] | Freq: Once | INTRAMUSCULAR | Status: DC

## 2019-10-20 NOTE — Progress Notes (Signed)
Patient here for third set of Bicillin LA 2.4 mil.given left and right upper outer quadrant. Patient also given 400  mgs of ibuprofen due to increased hip pain with last injections.  I was concerned about productive brassy sounding cough so a temperature was taken . 94.9 [place]     Patients medical records indicate he is currently on Septra and azithromycin , advised patient to call the office if she develops shortness of breath or fever.  Also given a reminder to return for December follow up.   Laverle Patter, RN

## 2019-10-22 ENCOUNTER — Encounter: Payer: Self-pay | Admitting: Infectious Diseases

## 2019-10-30 ENCOUNTER — Encounter (INDEPENDENT_AMBULATORY_CARE_PROVIDER_SITE_OTHER): Payer: Self-pay

## 2019-10-30 ENCOUNTER — Other Ambulatory Visit: Payer: Self-pay

## 2019-10-30 ENCOUNTER — Ambulatory Visit (INDEPENDENT_AMBULATORY_CARE_PROVIDER_SITE_OTHER): Payer: Self-pay | Admitting: Infectious Diseases

## 2019-10-30 ENCOUNTER — Ambulatory Visit: Payer: Self-pay

## 2019-10-30 ENCOUNTER — Encounter: Payer: Self-pay | Admitting: Infectious Diseases

## 2019-10-30 DIAGNOSIS — A528 Late syphilis, latent: Secondary | ICD-10-CM

## 2019-10-30 DIAGNOSIS — Z789 Other specified health status: Secondary | ICD-10-CM

## 2019-10-30 DIAGNOSIS — B20 Human immunodeficiency virus [HIV] disease: Secondary | ICD-10-CM

## 2019-10-30 DIAGNOSIS — F640 Transsexualism: Secondary | ICD-10-CM

## 2019-10-30 NOTE — Assessment & Plan Note (Signed)
She completed treatment with 3 injections.  We will follow up RPR in 6 months.

## 2019-10-30 NOTE — Patient Instructions (Addendum)
Happy to see you are doing well!  Please stop by the lab on your way out to repeat your viral load. Once you get a phone please give [place] a call at [phone] so we can give you your lab results and help you set up your MyChart.   Please continue taking your Biktarvy and Bactrim every single day! It sounds like you are doing well early on.   For your aches and pains we discussed - please start taking over the counter Aleve (or the generic Naproxen) twice a day with food for a week. Rest to your elbow and ice will also be helpful. Apply ice to the elbow 3-4 times a day if you can for 15 minutes at a time to help with inflammation. Continue to move it gently with stretching to avoid it from getting too tight.   OK to take tylenol up to 3000 mg a day (this would be 2 extra strength tylenol pills taken every 6-8 hours for 3 doses).   Return Visit: recommended in 2 months with lab work at the visit.

## 2019-10-30 NOTE — Assessment & Plan Note (Signed)
Not currently on hormones but is interested in them - will discuss at future visits. Dresses in alignment with gender identity.

## 2019-10-30 NOTE — Assessment & Plan Note (Signed)
Esophageal candidiasis has resolved.  We will continue her Bactrim until CD4 count greater than 200 x 3 months.  No need for further azithromycin at this time.  No other findings concerning for IRIS.

## 2019-10-30 NOTE — Assessment & Plan Note (Signed)
She has done well with her adherence to her Biktarvy with only 2 missed doses over the last 30 days.  We will repeat viral load for therapeutic response today.  She is not currently sexually active, counseling provided.  We discussed further screening for STI with oral and rectal swab.  She would like to do this at her upcoming appointment if possible.  She is asymptomatic for any GI or GU problems today.  Transportation is challenging for her.  She is working with [employer], theatre/television/film and plans to meet with [employer] today. I asked her to please call the clinic to get her lab results when she gets a phone number and so we may help her sign up for her MyChart account. I will have her return in 2 months and repeat labs including her CD4 at the visit.

## 2019-10-30 NOTE — Progress Notes (Signed)
Name: Tyler Vargas  DOB: 09/07/1986 MRN: [id number] PCP: Patient, No Pcp Per    Patient Active Problem List   Diagnosis Date Noted  . HIV (human immunodeficiency virus infection) ([place]) 09/30/2019    Priority: High  . AIDS (acquired immune deficiency syndrome) ([place]) 09/30/2019    Priority: High  . 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 Psoriasis 09/30/2019  . 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 Male-to-male transgender person 09/30/2019  . Depression 09/30/2019     Brief Narrative:  Tyler Vargas is a 33 y.o. adult is a 33 y.o. transfemale with HIV disease, Dx 12-2015 during incarceration. CD4 nadir 38 VL 1,400,000 copies HIV Risk: transfemale/MSM History of OIs: esophageal candidiasis  Intake Labs 08/2019: Hep B sAg (-), sAb (+), cAb (-); Hep A (-), Hep C (-) Quantiferon (inteterminant) HLA B*5701 (-) G6PD: () Toxo IgG: ()   Previous Regimens: . Biktarvy  Genotypes: . 08/2019 - pending   Subjective:   Chief Complaint  Patient presents with  . Follow-up     HPI: Tyler Vargas has been doing well over the last month.  Tyler Vargas is continue to take her Biktarvy and Bactrim once a day as prescribed.  Tyler Vargas does recall 2 missed doses due to "too much going on the day."  Tyler Vargas had some side effects early on but does not recall specifically what they were as they have all resolved.  Tyler Vargas feels like Tyler Vargas has more energy since her last office visit.  Her thrush has resolved and swallowing is much easier.  Tyler Vargas feels like Tyler Vargas may have "filled out a little bit."  Tyler Vargas reports no complaints today suggestive of associated opportunistic infection or IRIS and specifically denies fevers, night sweats, weight loss, anorexia, cough, SOB, nausea, vomiting, diarrhea, headache, sensory changes, lymphadenopathy or oral thrush.   The only concern Tyler Vargas has today is some right elbow pain and left upper back pain.  Tyler Vargas is not certain as to when this started.  Feels like it is progressively getting  worse.  Tyler Vargas is not used any over-the-counter medications but figured Tylenol would be her best bet.  Tyler Vargas is not currently working or overusing either site.  No specific mechanism of injury Tyler Vargas recalls.  Feels like sleep aggravates it when Tyler Vargas is in the "wrong position."  Symptoms are described to be mild to moderate with intermittent flares.   Review of Systems  Constitutional: Negative for chills, fever, malaise/fatigue and weight loss.  HENT: Negative for sore throat.        No dental problems  Respiratory: Negative for cough and sputum production.   Cardiovascular: Negative for chest pain and leg swelling.  Gastrointestinal: Negative for abdominal pain, diarrhea and vomiting.  Genitourinary: Negative for dysuria and flank pain.  Musculoskeletal: Positive for back pain and joint pain. Negative for myalgias and neck pain.  Skin: Negative for rash.  Neurological: Negative for dizziness, tingling and headaches.  Psychiatric/Behavioral: Negative for depression and substance abuse. The patient is not nervous/anxious and does not have insomnia.      Past Medical History:  Diagnosis Date  . HIV (human immunodeficiency virus infection) ([place])   . Male-to-male transgender person     Outpatient Medications Prior to Visit  Medication Sig Dispense Refill  . bictegravir-emtricitabine-tenofovir AF (BIKTARVY) 50-200-25 MG TABS tablet Take 1 tablet by mouth daily. Try to take at the same time each day with or without food. 30 tablet 5  . fluconazole (DIFLUCAN) 200 MG tablet Take 1 tablet (  200 mg total) by mouth daily. 21 tablet 0  . sulfamethoxazole-trimethoprim (BACTRIM DS) 800-160 MG tablet Take 1 tablet by mouth daily. 30 tablet 5  . azithromycin (ZITHROMAX) 600 MG tablet Take 2 tablets (1,200 mg total) by mouth once a week. (Patient not taking: Reported on 10/30/2019) 8 tablet 11  . penicillin g benzathine (BICILLIN LA) 1200000 UNIT/2ML injection 1.2 Million Units      No facility-administered  medications prior to visit.     No Known Allergies  Social History   Tobacco Use  . Smoking status: Current Every Day Smoker    Packs/day: 10.00    Types: Cigarettes  . Smokeless tobacco: Never Used  . Tobacco comment: cutting back  Substance Use Topics  . Alcohol use: Yes    Comment: every 2-3 days; beer, vodka  . Drug use: Yes    Types: Marijuana, Cocaine    Comment: cocaine 2 weeks ago    Family History  Problem Relation Age of Onset  . Cancer Neg Hx   . Diabetes Neg Hx     Social History   Substance and Sexual Activity  Sexual Activity Not Currently  . Partners: Male   Comment: offered condoms 10/2019     Objective:   Vitals:   10/30/19 1021  BP: 140/84  Pulse: (!) 113  Temp: 98 F (36.7 C)  Weight: 126 lb (57.2 kg)  Height: 5' 8" (1.727 m)   Body mass index is 19.16 kg/m.   Physical Exam Vitals reviewed.  Constitutional:      Appearance: Tyler Vargas is well-developed.     Comments: Seated comfortably in chair.   HENT:     Mouth/Throat:     Mouth: Mucous membranes are moist. No oral lesions.     Dentition: Normal dentition. No dental abscesses.     Pharynx: Oropharynx is clear. No oropharyngeal exudate.  Eyes:     General: No scleral icterus. Cardiovascular:     Rate and Rhythm: Normal rate and regular rhythm.     Heart sounds: Normal heart sounds.  Pulmonary:     Effort: Pulmonary effort is normal.     Breath sounds: Normal breath sounds.     Comments: No cough demonstrated during exam Abdominal:     General: There is no distension.     Palpations: Abdomen is soft.     Tenderness: There is no abdominal tenderness.  Musculoskeletal:     Right elbow: No swelling or deformity. Normal range of motion. Tenderness present in medial epicondyle. No lateral epicondyle or olecranon process tenderness.     Left elbow: Normal.       Back:     Comments: Area marked as where patient declares to have pain.  There is some tenderness Tyler Vargas describes to be mild  with palpation.  No deformity, swelling, spasm, decreased range of motion of the shoulder joint or neck.  Lymphadenopathy:     Cervical: No cervical adenopathy.  Skin:    General: Skin is warm and dry.     Capillary Refill: Capillary refill takes less than 2 seconds.     Findings: No rash.  Neurological:     Mental Status: Tyler Vargas is alert and oriented to person, place, and time.  Psychiatric:        Judgment: Judgment normal.     Comments: In good spirits today and engaged in care discussion     Lab Results Lab Results  Component Value Date   WBC 6.0 09/15/2019   HGB 8.4 (  L) 09/15/2019   HCT 25.7 (L) 09/15/2019   MCV 87.1 09/15/2019   PLT 657 (H) 09/15/2019    Lab Results  Component Value Date   CREATININE 0.79 09/15/2019   BUN 8 09/15/2019   NA 135 09/15/2019   K 4.2 09/15/2019   CL 100 09/15/2019   CO2 24 09/15/2019    Lab Results  Component Value Date   ALT 7 (L) 09/15/2019   AST 20 09/15/2019   ALKPHOS 49 07/07/2019   BILITOT 0.2 09/15/2019    Lab Results  Component Value Date   CHOL 98 09/15/2019   HDL 22 (L) 09/15/2019   LDLCALC 55 09/15/2019   TRIG 130 09/15/2019   CHOLHDL 4.5 09/15/2019   HIV 1 RNA Quant (copies/mL)  Date Value  09/15/2019 1,400,000 (H)   CD4 T Cell Abs (/uL)  Date Value  09/15/2019 38 (L)  07/07/2019 44 (L)     Assessment & Plan:   Problem List Items Addressed This Visit      High   HIV (human immunodeficiency virus infection) ([employer]) (Chronic)    Tyler Vargas has done well with her adherence to her Biktarvy with only 2 missed doses over the last 30 days.  We will repeat viral load for therapeutic response today.  Tyler Vargas is not currently sexually active, counseling provided.  We discussed further screening for STI with oral and rectal swab.  Tyler Vargas would like to do this at her upcoming appointment if possible.  Tyler Vargas is asymptomatic for any GI or GU problems today.  Transportation is challenging for her.  Tyler Vargas is working with [employer], theatre/television/film  and plans to meet with [employer] today. I asked her to please call the clinic to get her lab results when Tyler Vargas gets a phone number and so we may help her sign up for her MyChart account. I will have her return in 2 months and repeat labs including her CD4 at the visit.      AIDS (acquired immune deficiency syndrome) ([place])    Esophageal candidiasis has resolved.  We will continue her Bactrim until CD4 count greater than 200 x 3 months.  No need for further azithromycin at this time.  No other findings concerning for IRIS.         Medium   Late latent syphilis    Tyler Vargas completed treatment with 3 injections.  We will follow up RPR in 6 months.        Unprioritized   Male-to-male transgender person    Not currently on hormones but is interested in them - will discuss at future visits. Dresses in alignment with gender identity.          [name], MSN, NP-C [facility] for Infectious [place] Pager: [phone] Office: [phone]  10/30/19  11:31 AM

## 2019-11-04 LAB — HIV-1 RNA QUANT-NO REFLEX-BLD
HIV 1 RNA Quant: 880 copies/mL — ABNORMAL HIGH
HIV-1 RNA Quant, Log: 2.94 Log copies/mL — ABNORMAL HIGH

## 2019-11-06 ENCOUNTER — Ambulatory Visit: Payer: Self-pay

## 2019-12-31 ENCOUNTER — Ambulatory Visit: Payer: Self-pay | Admitting: Infectious Diseases

## 2020-01-28 ENCOUNTER — Other Ambulatory Visit: Payer: Self-pay

## 2020-01-28 ENCOUNTER — Ambulatory Visit (INDEPENDENT_AMBULATORY_CARE_PROVIDER_SITE_OTHER): Payer: Self-pay | Admitting: Infectious Diseases

## 2020-01-28 ENCOUNTER — Encounter: Payer: Self-pay | Admitting: Infectious Diseases

## 2020-01-28 VITALS — BP 111/78 | HR 93 | Temp 98.4°F | Ht 68.0 in | Wt 154.0 lb

## 2020-01-28 DIAGNOSIS — B20 Human immunodeficiency virus [HIV] disease: Secondary | ICD-10-CM

## 2020-01-28 DIAGNOSIS — Z8619 Personal history of other infectious and parasitic diseases: Secondary | ICD-10-CM

## 2020-01-28 DIAGNOSIS — Z21 Asymptomatic human immunodeficiency virus [HIV] infection status: Secondary | ICD-10-CM

## 2020-01-28 DIAGNOSIS — F1410 Cocaine abuse, uncomplicated: Secondary | ICD-10-CM

## 2020-01-28 MED ORDER — SULFAMETHOXAZOLE-TRIMETHOPRIM 800-160 MG PO TABS: 1.0000 | ORAL_TABLET | Freq: Every day | ORAL | 5 refills | Status: DC

## 2020-01-28 MED ORDER — BICTEGRAVIR-EMTRICITAB-TENOFOV 50-200-25 MG PO TABS: 1.0000 | ORAL_TABLET | Freq: Every day | ORAL | 5 refills | Status: DC
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
## 2020-01-28 NOTE — Patient Instructions (Addendum)
Very nice to see you today -   Please call the pharmacy this week to arrange for your medications to be mailed to your current residence (or to our clinic if that works better).   You have 2 prescriptions to arrange for pick up - BIKTARVY (red pill) and BACTRIM (white pill)   Please meet with Marcelino Duster with our financial team to re-apply for your ADAP to cover your medications and doctor visits.   Please come back in 2 months to check in again - tell the front desk you need transportation arranged for this appointment.

## 2020-01-28 NOTE — Assessment & Plan Note (Addendum)
Patient been out of Biktarvy for a month now due to not picking up from pharmacy. Need help with transportation. Today we gave her a two week supply of Biktarvy until she can coordinate with pharmacy on where to mail the prescription to. Last HIV viral load was 880 on 10/30/2019. Will check HIV viral load and CD4 count today. Return to clinic in two months for follow up.

## 2020-01-29 ENCOUNTER — Encounter: Payer: Self-pay | Admitting: Infectious Diseases

## 2020-01-29 DIAGNOSIS — F1410 Cocaine abuse, uncomplicated: Secondary | ICD-10-CM | POA: Insufficient documentation

## 2020-01-29 LAB — T-HELPER CELL (CD4) - (RCID CLINIC ONLY)
CD4 % Helper T Cell: 14 % — ABNORMAL LOW (ref 33–65)
CD4 T Cell Abs: 81 /uL — ABNORMAL LOW (ref 400–1790)

## 2020-01-29 NOTE — Assessment & Plan Note (Signed)
No OIs on exam today. Will repeat CD4 and have her continue on Bactrim which will need to be mailed from her home. I provided her refills for this today.

## 2020-01-29 NOTE — Assessment & Plan Note (Signed)
Repeat titer.

## 2020-01-29 NOTE — Assessment & Plan Note (Signed)
She continues to use twice a week. Unclear really why she continues but likes the way it makes her feel. She thinks she could stop if she tried. Will continue to offer support for substance use cessation. Introduced to [facility] today as well. Counseled on cardiovascular and behavioral effects related to cocaine use long term. She is pre contemplative.

## 2020-01-30 LAB — HIV-1 RNA QUANT-NO REFLEX-BLD
HIV 1 RNA Quant: 1810000 copies/mL — ABNORMAL HIGH
HIV-1 RNA Quant, Log: 6.26 Log copies/mL — ABNORMAL HIGH

## 2020-02-02 NOTE — Progress Notes (Signed)
Tried to call Tyler Vargas to let her know her labs -  Her viral load is very high at 1.8 million copies. Rein[place] again getting back on her HIV medication everyday. She is considered at high risk to transmit inflection to a partner so please no sex with anyone until we can get this back down.   Her immune system is also still quite low - she needs to continue her Bactrim once a day also.   Can you make certain she picked up both her medications? Thank you!

## 2020-02-05 ENCOUNTER — Telehealth: Payer: Self-pay | Admitting: *Deleted

## 2020-02-05 NOTE — Progress Notes (Signed)
LVM--to call the office back regarding lab results. 

## 2020-02-05 NOTE — Telephone Encounter (Signed)
LVM--to call the office back regarding lab results. 

## 2020-02-14 ENCOUNTER — Encounter (HOSPITAL_COMMUNITY): Payer: Self-pay | Admitting: Emergency Medicine

## 2020-02-14 ENCOUNTER — Inpatient Hospital Stay (HOSPITAL_COMMUNITY)
Admission: EM | Admit: 2020-02-14 | Discharge: 2020-02-25 | DRG: 974 | Disposition: A | Discharge status: Hospice | Payer: Medicaid Other | Attending: Internal Medicine | Admitting: Internal Medicine

## 2020-02-14 ENCOUNTER — Emergency Department (HOSPITAL_COMMUNITY): Payer: Medicaid Other

## 2020-02-14 DIAGNOSIS — Z8673 Personal history of transient ischemic attack (TIA), and cerebral infarction without residual deficits: Secondary | ICD-10-CM | POA: Diagnosis not present

## 2020-02-14 DIAGNOSIS — R509 Fever, unspecified: Secondary | ICD-10-CM | POA: Diagnosis present

## 2020-02-14 DIAGNOSIS — R1310 Dysphagia, unspecified: Secondary | ICD-10-CM | POA: Diagnosis present

## 2020-02-14 DIAGNOSIS — G0490 Encephalitis and encephalomyelitis, unspecified: Secondary | ICD-10-CM | POA: Diagnosis present

## 2020-02-14 DIAGNOSIS — U071 COVID-19: Secondary | ICD-10-CM | POA: Diagnosis present

## 2020-02-14 DIAGNOSIS — F1410 Cocaine abuse, uncomplicated: Secondary | ICD-10-CM | POA: Diagnosis present

## 2020-02-14 DIAGNOSIS — I76 Septic arterial embolism: Secondary | ICD-10-CM | POA: Diagnosis present

## 2020-02-14 DIAGNOSIS — Z9289 Personal history of other medical treatment: Secondary | ICD-10-CM

## 2020-02-14 DIAGNOSIS — G8194 Hemiplegia, unspecified affecting left nondominant side: Secondary | ICD-10-CM | POA: Diagnosis present

## 2020-02-14 DIAGNOSIS — G030 Nonpyogenic meningitis: Secondary | ICD-10-CM | POA: Diagnosis present

## 2020-02-14 DIAGNOSIS — I6381 Other cerebral infarction due to occlusion or stenosis of small artery: Secondary | ICD-10-CM | POA: Diagnosis present

## 2020-02-14 DIAGNOSIS — F649 Gender identity disorder, unspecified: Secondary | ICD-10-CM | POA: Diagnosis present

## 2020-02-14 DIAGNOSIS — Z66 Do not resuscitate: Secondary | ICD-10-CM | POA: Diagnosis not present

## 2020-02-14 DIAGNOSIS — I63511 Cerebral infarction due to unspecified occlusion or stenosis of right middle cerebral artery: Secondary | ICD-10-CM | POA: Diagnosis present

## 2020-02-14 DIAGNOSIS — G9341 Metabolic encephalopathy: Secondary | ICD-10-CM | POA: Diagnosis present

## 2020-02-14 DIAGNOSIS — B957 Other staphylococcus as the cause of diseases classified elsewhere: Secondary | ICD-10-CM | POA: Diagnosis not present

## 2020-02-14 DIAGNOSIS — J1282 Pneumonia due to coronavirus disease 2019: Secondary | ICD-10-CM | POA: Diagnosis present

## 2020-02-14 DIAGNOSIS — Z515 Encounter for palliative care: Secondary | ICD-10-CM | POA: Diagnosis not present

## 2020-02-14 DIAGNOSIS — R7881 Bacteremia: Secondary | ICD-10-CM | POA: Diagnosis present

## 2020-02-14 DIAGNOSIS — I619 Nontraumatic intracerebral hemorrhage, unspecified: Secondary | ICD-10-CM | POA: Diagnosis present

## 2020-02-14 DIAGNOSIS — Z21 Asymptomatic human immunodeficiency virus [HIV] infection status: Secondary | ICD-10-CM | POA: Diagnosis not present

## 2020-02-14 DIAGNOSIS — Z8619 Personal history of other infectious and parasitic diseases: Secondary | ICD-10-CM | POA: Diagnosis present

## 2020-02-14 DIAGNOSIS — B20 Human immunodeficiency virus [HIV] disease: Secondary | ICD-10-CM | POA: Diagnosis present

## 2020-02-14 DIAGNOSIS — G009 Bacterial meningitis, unspecified: Secondary | ICD-10-CM | POA: Diagnosis present

## 2020-02-14 DIAGNOSIS — R531 Weakness: Secondary | ICD-10-CM

## 2020-02-14 DIAGNOSIS — G8321 Monoplegia of upper limb affecting right dominant side: Secondary | ICD-10-CM | POA: Diagnosis not present

## 2020-02-14 DIAGNOSIS — R0602 Shortness of breath: Secondary | ICD-10-CM

## 2020-02-14 DIAGNOSIS — F17210 Nicotine dependence, cigarettes, uncomplicated: Secondary | ICD-10-CM | POA: Diagnosis present

## 2020-02-14 DIAGNOSIS — R609 Edema, unspecified: Secondary | ICD-10-CM

## 2020-02-14 DIAGNOSIS — Z79899 Other long term (current) drug therapy: Secondary | ICD-10-CM

## 2020-02-14 DIAGNOSIS — Z7189 Other specified counseling: Secondary | ICD-10-CM | POA: Diagnosis not present

## 2020-02-14 DIAGNOSIS — R4701 Aphasia: Secondary | ICD-10-CM | POA: Diagnosis present

## 2020-02-14 DIAGNOSIS — I639 Cerebral infarction, unspecified: Secondary | ICD-10-CM | POA: Diagnosis not present

## 2020-02-14 DIAGNOSIS — A872 Lymphocytic choriomeningitis: Secondary | ICD-10-CM | POA: Diagnosis not present

## 2020-02-14 LAB — RAPID URINE DRUG SCREEN, HOSP PERFORMED
Amphetamines: NOT DETECTED
Barbiturates: NOT DETECTED
Benzodiazepines: NOT DETECTED
Cocaine: POSITIVE — AB
Opiates: NOT DETECTED
Tetrahydrocannabinol: NOT DETECTED

## 2020-02-14 LAB — COMPREHENSIVE METABOLIC PANEL
ALT: 13 U/L (ref 0–44)
AST: 25 U/L (ref 15–41)
Albumin: 2.3 g/dL — ABNORMAL LOW (ref 3.5–5.0)
Alkaline Phosphatase: 56 U/L (ref 38–126)
Anion gap: 12 (ref 5–15)
BUN: 9 mg/dL (ref 6–20)
CO2: 23 mmol/L (ref 22–32)
Calcium: 8.9 mg/dL (ref 8.9–10.3)
Chloride: 97 mmol/L — ABNORMAL LOW (ref 98–111)
Creatinine, Ser: 0.86 mg/dL (ref 0.61–1.24)
GFR calc Af Amer: >60 mL/min (ref >60)
GFR calc non Af Amer: >60 mL/min (ref >60)
Glucose, Bld: 114 mg/dL — ABNORMAL HIGH (ref 70–99)
Potassium: 3.7 mmol/L (ref 3.5–5.1)
Sodium: 132 mmol/L — ABNORMAL LOW (ref 135–145)
Total Bilirubin: 0.5 mg/dL (ref 0.3–1.2)
Total Protein: 9.4 g/dL — ABNORMAL HIGH (ref 6.5–8.1)

## 2020-02-14 LAB — I-STAT CHEM 8, ED
BUN: 9 mg/dL (ref 6–20)
Calcium, Ion: 1.13 mmol/L — ABNORMAL LOW (ref 1.15–1.40)
Chloride: 97 mmol/L — ABNORMAL LOW (ref 98–111)
Creatinine, Ser: 0.8 mg/dL (ref 0.61–1.24)
Glucose, Bld: 115 mg/dL — ABNORMAL HIGH (ref 70–99)
HCT: 33 % — ABNORMAL LOW (ref 39.0–52.0)
Hemoglobin: 11.2 g/dL — ABNORMAL LOW (ref 13.0–17.0)
Potassium: 3.7 mmol/L (ref 3.5–5.1)
Sodium: 133 mmol/L — ABNORMAL LOW (ref 135–145)
TCO2: 27 mmol/L (ref 22–32)

## 2020-02-14 LAB — DIFFERENTIAL
Abs Immature Granulocytes: 0.14 10*3/uL — ABNORMAL HIGH (ref 0.00–0.07)
Basophils Absolute: 0.1 10*3/uL (ref 0.0–0.1)
Basophils Relative: 0 %
Eosinophils Absolute: 0 10*3/uL (ref 0.0–0.5)
Eosinophils Relative: 0 %
Immature Granulocytes: 1 %
Lymphocytes Relative: 7 %
Lymphs Abs: 0.8 10*3/uL (ref 0.7–4.0)
Monocytes Absolute: 0.5 10*3/uL (ref 0.1–1.0)
Monocytes Relative: 5 %
Neutro Abs: 9.9 10*3/uL — ABNORMAL HIGH (ref 1.7–7.7)
Neutrophils Relative %: 87 %

## 2020-02-14 LAB — APTT: aPTT: 34 seconds (ref 24–36)

## 2020-02-14 LAB — URINALYSIS, ROUTINE W REFLEX MICROSCOPIC
Bacteria, UA: NONE SEEN
Bilirubin Urine: NEGATIVE
Glucose, UA: NEGATIVE mg/dL
Ketones, ur: NEGATIVE mg/dL
Leukocytes,Ua: NEGATIVE
Nitrite: NEGATIVE
Protein, ur: NEGATIVE mg/dL
Specific Gravity, Urine: >1.046 — ABNORMAL HIGH (ref 1.005–1.030)
pH: 7 (ref 5.0–8.0)

## 2020-02-14 LAB — CBC
HCT: 35 % — ABNORMAL LOW (ref 39.0–52.0)
Hemoglobin: 10.6 g/dL — ABNORMAL LOW (ref 13.0–17.0)
MCH: 28.9 pg (ref 26.0–34.0)
MCHC: 30.3 g/dL (ref 30.0–36.0)
MCV: 95.4 fL (ref 80.0–100.0)
Platelets: 561 10*3/uL — ABNORMAL HIGH (ref 150–400)
RBC: 3.67 MIL/uL — ABNORMAL LOW (ref 4.22–5.81)
RDW: 15.7 % — ABNORMAL HIGH (ref 11.5–15.5)
WBC: 11.4 10*3/uL — ABNORMAL HIGH (ref 4.0–10.5)
nRBC: 0 % (ref 0.0–0.2)

## 2020-02-14 LAB — LACTIC ACID, PLASMA: Lactic Acid, Venous: 1.7 mmol/L (ref 0.5–1.9)

## 2020-02-14 LAB — PROTIME-INR
INR: 1.1 (ref 0.8–1.2)
Prothrombin Time: 14.6 seconds (ref 11.4–15.2)

## 2020-02-14 LAB — CRYPTOCOCCAL ANTIGEN: Crypto Ag: NEGATIVE

## 2020-02-14 MED ORDER — SENNOSIDES-DOCUSATE SODIUM 8.6-50 MG PO TABS
1.0000 | ORAL_TABLET | Freq: Every evening | ORAL | Status: DC | PRN
  Administered 2020-02-22: 1 via ORAL
  Filled 2020-02-14: qty 1

## 2020-02-14 MED ORDER — VANCOMYCIN HCL 1500 MG/300ML IV SOLN
1500.0000 mg | Freq: Once | INTRAVENOUS | Status: AC
  Administered 2020-02-14: 1500 mg via INTRAVENOUS
  Filled 2020-02-14: qty 300

## 2020-02-14 MED ORDER — IOHEXOL 350 MG/ML SOLN
75.0000 mL | Freq: Once | INTRAVENOUS | Status: AC | PRN
  Administered 2020-02-14: 75 mL via INTRAVENOUS

## 2020-02-14 MED ORDER — VANCOMYCIN HCL IN DEXTROSE 1-5 GM/200ML-% IV SOLN
1000.0000 mg | Freq: Three times a day (TID) | INTRAVENOUS | Status: DC
  Administered 2020-02-15: 1000 mg via INTRAVENOUS
  Filled 2020-02-14 (×3): qty 200

## 2020-02-14 MED ORDER — STROKE: EARLY STAGES OF RECOVERY BOOK: Freq: Once | Status: DC

## 2020-02-14 MED ORDER — SODIUM CHLORIDE 0.9 % IV SOLN: INTRAVENOUS | Status: DC

## 2020-02-14 MED ORDER — MIDAZOLAM HCL 2 MG/2ML IJ SOLN
2.0000 mg | Freq: Once | INTRAMUSCULAR | Status: AC
  Administered 2020-02-14: 2 mg via INTRAVENOUS
  Filled 2020-02-14: qty 2

## 2020-02-14 MED ORDER — IOHEXOL 350 MG/ML SOLN
40.0000 mL | Freq: Once | INTRAVENOUS | Status: AC | PRN
  Administered 2020-02-14: 40 mL via INTRAVENOUS

## 2020-02-14 MED ORDER — ACETAMINOPHEN 650 MG RE SUPP
650.0000 mg | RECTAL | Status: DC | PRN
  Administered 2020-02-15: 650 mg via RECTAL
  Filled 2020-02-14: qty 1

## 2020-02-14 MED ORDER — BICTEGRAVIR-EMTRICITAB-TENOFOV 50-200-25 MG PO TABS
1.0000 | ORAL_TABLET | Freq: Every day | ORAL | Status: DC
  Filled 2020-02-14 (×5): qty 1

## 2020-02-14 MED ORDER — METRONIDAZOLE IN NACL 5-0.79 MG/ML-% IV SOLN: 500.0000 mg | Freq: Once | INTRAVENOUS | Status: DC

## 2020-02-14 MED ORDER — ACETAMINOPHEN 325 MG PO TABS: 650.0000 mg | ORAL_TABLET | ORAL | Status: DC | PRN

## 2020-02-14 MED ORDER — SODIUM CHLORIDE 0.9 % IV SOLN: 2.0000 g | Freq: Once | INTRAVENOUS | Status: DC

## 2020-02-14 MED ORDER — VANCOMYCIN HCL IN DEXTROSE 1-5 GM/200ML-% IV SOLN: 1000.0000 mg | Freq: Once | INTRAVENOUS | Status: DC

## 2020-02-14 MED ORDER — SODIUM CHLORIDE 0.9 % IV SOLN: 2.0000 g | Freq: Three times a day (TID) | INTRAVENOUS | Status: DC

## 2020-02-14 MED ORDER — SULFAMETHOXAZOLE-TRIMETHOPRIM 800-160 MG PO TABS
1.0000 | ORAL_TABLET | Freq: Every day | ORAL | Status: DC
  Filled 2020-02-14: qty 1

## 2020-02-14 MED ORDER — PENICILLIN G POTASSIUM 20000000 UNITS IJ SOLR
4.0000 10*6.[IU] | INTRAVENOUS | Status: DC
  Administered 2020-02-14 – 2020-02-15 (×5): 4 10*6.[IU] via INTRAVENOUS
  Filled 2020-02-14 (×14): qty 4

## 2020-02-14 MED ORDER — SODIUM CHLORIDE 0.9% FLUSH
3.0000 mL | Freq: Once | INTRAVENOUS | Status: AC
  Administered 2020-02-14: 3 mL via INTRAVENOUS

## 2020-02-14 MED ORDER — ACETAMINOPHEN 160 MG/5ML PO SOLN
650.0000 mg | ORAL | Status: DC | PRN
  Administered 2020-02-18 – 2020-02-24 (×11): 650 mg
  Filled 2020-02-14 (×11): qty 20.3

## 2020-02-14 NOTE — H&P (Signed)
History and Physical    Tyler Vargas WUJ:[id number] DOB: 12/27/1985 DOA: 02/14/2020  PCP: Patient, No Pcp Per   Patient coming from: Home   Chief Complaint: Weakness   HPI: Tyler Vargas is a 34 y.o. adult with medical history significant for HIV/AIDS with CD4 count 84 and viral load 1.8 million earlier this month, latent syphilis, and cocaine abuse, now presenting to the emergency department with weakness.  History is limited by the patient's clinical condition.  EMS was reportedly called out due to 2 days of generalized weakness.  Once in the ED, patient became aphasic and was not following commands initially.  Patient was seen in the ID clinic on 01/28/2020, had been out of Biktarvy for a month at that time.  RPR titer was 1:128 in October 2020.  Patient began to follow some commands in the ED, but still not able to provide any history.  ED Course: Upon arrival to the ED, patient is found to be afebrile, saturating well on room air, and with blood pressure 140/110.  EKG features significant artifact, appears to be a sinus rhythm.  Appears to be in sinus rhythm on cardiac monitor.  Chemistry panel with sodium 132, albumin 2.3, and total protein 9.4.  CBC with leukocytosis to 11,400, stable normocytic anemia, and stable thrombocytosis.  CT head is concerning for acute right MCA infarctions involving the frontal lobe, insula, and basal ganglia with small focus of hemorrhage in the right frontal operculum.  Also noted on CT is a new age-indeterminate lacunar infarction in the left internal capsule.  Neurology has evaluated the patient and recommends a medical admission for further stroke work-up as well as blood cultures and TEE, but no aspirin or anticoagulation.    Review of Systems:  Unable to complete ROS secondary the patient's clinical condition.  Past Medical History:  Diagnosis Date  . History of syphilis 09/30/2019  . HIV (human immunodeficiency virus infection) (HCC)   . Male-to-male  transgender person     Past Surgical History:  Procedure Laterality Date  . APPENDECTOMY       reports that she has been smoking cigarettes. She has been smoking about 0.25 packs per day. She has never used smokeless tobacco. She reports current alcohol use. She reports current drug use. Drugs: Marijuana and Cocaine.  No Known Allergies  Family History  Problem Relation Age of Onset  . Cancer Neg Hx   . Diabetes Neg Hx      Prior to Admission medications   Medication Sig Start Date End Date Taking? Authorizing Provider  bictegravir-emtricitabine-tenofovir AF (BIKTARVY) 50-200-25 MG TABS tablet Take 1 tablet by mouth daily. Try to take at the same time each day with or without food. 01/28/20   Blanchard Kelch, NP  sulfamethoxazole-trimethoprim (BACTRIM DS) 800-160 MG tablet Take 1 tablet by mouth daily. 01/28/20   Blanchard Kelch, NP    Physical Exam: Vitals:   02/14/20 2100 02/14/20 2112 02/14/20 2115 02/14/20 2130  BP: (!) 142/115 (!) 142/115 (!) 148/98 (!) 145/100  Pulse: 90 87 86 84  Resp: 20 (!) Temp:      TempSrc:      SpO2: 100% 100% 100% 100%  Weight:  67.7 kg      Constitutional: NAD, obtunded   Eyes: PERTLA, lids and conjunctivae normal ENMT: Mucous membranes are moist. Posterior pharynx clear of any exudate or lesions.   Neck: normal, supple, no masses, no thyromegaly Respiratory:  no wheezing, no crackles. No accessory  muscle use.  Cardiovascular: S1 & S2 heard, regular rate and rhythm. No extremity edema.   Abdomen: No distension, no tenderness, soft. Bowel sounds active.  Musculoskeletal: no clubbing / cyanosis. No joint deformity upper and lower extremities.   Skin: no significant rashes, lesions, ulcers. Warm, dry, well-perfused. Neurologic: No gross facial asymmetry, PERRL. Aphasic. Strength assessment limited by patient condition, following some commands with grip 5/5 on left, 4/5 on right, will move both LEs.      Labs and Imaging on  Admission: I have personally reviewed following labs and imaging studies  CBC: Recent Labs  Lab 02/14/20 1905 02/14/20 1911  WBC 11.4*  --   NEUTROABS 9.9*  --   HGB 10.6* 11.2*  HCT 35.0* 33.0*  MCV 95.4  --   PLT 561*  --    Basic Metabolic Panel: Recent Labs  Lab 02/14/20 1905 02/14/20 1911  NA 132* 133*  K 3.7 3.7  CL 97* 97*  CO2 23  --   GLUCOSE 114* 115*  BUN 9 9  CREATININE 0.86 0.80  CALCIUM 8.9  --    GFR: Estimated Creatinine Clearance (by C-G formula based on SCr of 0.8 mg/dL) Male: 100.9 mL/min Male: 125.8 mL/min Liver Function Tests: Recent Labs  Lab 02/14/20 1905  AST 25  ALT 13  ALKPHOS 56  BILITOT 0.5  PROT 9.4*  ALBUMIN 2.3*   No results for input(s): LIPASE, AMYLASE in the last 168 hours. No results for input(s): AMMONIA in the last 168 hours. Coagulation Profile: Recent Labs  Lab 02/14/20 1905  INR 1.1   Cardiac Enzymes: No results for input(s): CKTOTAL, CKMB, CKMBINDEX, TROPONINI in the last 168 hours. BNP (last 3 results) No results for input(s): PROBNP in the last 8760 hours. HbA1C: No results for input(s): HGBA1C in the last 72 hours. CBG: No results for input(s): GLUCAP in the last 168 hours. Lipid Profile: No results for input(s): CHOL, HDL, LDLCALC, TRIG, CHOLHDL, LDLDIRECT in the last 72 hours. Thyroid Function Tests: No results for input(s): TSH, T4TOTAL, FREET4, T3FREE, THYROIDAB in the last 72 hours. Anemia Panel: No results for input(s): VITAMINB12, FOLATE, FERRITIN, TIBC, IRON, RETICCTPCT in the last 72 hours. Urine analysis:    Component Value Date/Time   COLORURINE YELLOW 09/15/2019 1602   APPEARANCEUR CLOUDY (A) 09/15/2019 1602   LABSPEC 1.013 09/15/2019 1602   PHURINE 7.5 09/15/2019 1602   GLUCOSEU NEGATIVE 09/15/2019 1602   HGBUR NEGATIVE 09/15/2019 1602   BILIRUBINUR NEGATIVE 03/07/2014 0743   KETONESUR NEGATIVE 09/15/2019 1602   PROTEINUR 2+ (A) 09/15/2019 1602   UROBILINOGEN 0.2 03/07/2014 0743    NITRITE POSITIVE (A) 09/15/2019 1602   LEUKOCYTESUR 2+ (A) 09/15/2019 1602   Sepsis Labs: @LABRCNTIP (procalcitonin:4,lacticidven:4) )No results found for this or any previous visit (from the past 240 hour(s)).   Radiological Exams on Admission: CT ANGIO HEAD W OR WO CONTRAST  Result Date: 02/14/2020 CLINICAL DATA:  Stroke. Right-sided weakness and speech disturbance. EXAM: CT ANGIOGRAPHY HEAD AND NECK CT PERFUSION BRAIN TECHNIQUE: Multidetector CT imaging of the head and neck was performed using the standard protocol during bolus administration of intravenous contrast. Multiplanar CT image reconstructions and MIPs were obtained to evaluate the vascular anatomy. Carotid stenosis measurements (when applicable) are obtained utilizing NASCET criteria, using the distal internal carotid diameter as the denominator. Multiphase CT imaging of the brain was performed following IV bolus contrast injection. Subsequent parametric perfusion maps were calculated using RAPID software. CONTRAST:  48mL OMNIPAQUE IOHEXOL 350 MG/ML SOLN; 27mL OMNIPAQUE IOHEXOL  350 MG/ML SOLN COMPARISON:  None. FINDINGS: CTA NECK FINDINGS Aortic arch: Standard 3 vessel aortic arch with widely patent arch vessel origins. Right carotid system: Patent and smooth without evidence of stenosis or dissection. Left carotid system: Patent and smooth without evidence of stenosis or dissection. Vertebral arteries: Patent, smooth, and codominant without evidence of stenosis or dissection. Skeleton: Poor dentition with multiple periapical erosions. Other neck: Borderline enlarged level II lymph nodes bilaterally. Upper chest: Centrilobular emphysema. Small superior mediastinal lymph nodes. Review of the MIP images confirms the above findings CTA HEAD FINDINGS Anterior circulation: The internal carotid arteries are widely patent from skull base to carotid termini. The A1 and M1 segments are widely patent with the left A1 segment being mildly hypoplastic.  There is a decreased number of branch vessels in the right MCA superior division with suggestion of occlusion and intermittent reconstitution of distal M2 and M3 branches. There is also multifocal irregular narrowing of M3 branches in the right MCA inferior division, and there is mild left MCA branch vessel irregularity. No aneurysm is identified. Posterior circulation: The intracranial vertebral arteries are widely patent to the basilar. The basilar artery is widely patent. There are small posterior communicating arteries bilaterally. PCAs are patent with mild branch vessel irregularity more notable on the right without evidence of flow limiting proximal stenosis. No aneurysm is identified. Venous sinuses: Patent. Anatomic variants: None. Review of the MIP images confirms the above findings CT Brain Perfusion Findings: ASPECTS: 4 CBF (<30%) Volume: 0mL Perfusion (Tmax>6.0s) volume: 12mL Mismatch Volume: 12mL, however the core infarct on noncontrast head CT is not being picked up by the automated processing and the true penumbra is less than 12 mL Infarction Location: Right MCA IMPRESSION: 1. Multiple missing right MCA branch vessels in the superior division with wide patency of the M1 segment and no discrete proximal M2 occlusion identified. 2. Widely patent common carotid, internal carotid, and vertebral arteries. 3. CT perfusion demonstrating at most a small penumbra as detailed above. These results were called by telephone at the time of interpretation on 02/14/2020 at 7:55 p.m. to Dr. Laurence SlateAroor, who verbally acknowledged these results. Electronically Signed   By: Sebastian AcheAllen  Grady M.D.   On: 02/14/2020 20:22   CT ANGIO NECK W OR WO CONTRAST  Result Date: 02/14/2020 CLINICAL DATA:  Stroke. Right-sided weakness and speech disturbance. EXAM: CT ANGIOGRAPHY HEAD AND NECK CT PERFUSION BRAIN TECHNIQUE: Multidetector CT imaging of the head and neck was performed using the standard protocol during bolus administration of  intravenous contrast. Multiplanar CT image reconstructions and MIPs were obtained to evaluate the vascular anatomy. Carotid stenosis measurements (when applicable) are obtained utilizing NASCET criteria, using the distal internal carotid diameter as the denominator. Multiphase CT imaging of the brain was performed following IV bolus contrast injection. Subsequent parametric perfusion maps were calculated using RAPID software. CONTRAST:  40mL OMNIPAQUE IOHEXOL 350 MG/ML SOLN; 75mL OMNIPAQUE IOHEXOL 350 MG/ML SOLN COMPARISON:  None. FINDINGS: CTA NECK FINDINGS Aortic arch: Standard 3 vessel aortic arch with widely patent arch vessel origins. Right carotid system: Patent and smooth without evidence of stenosis or dissection. Left carotid system: Patent and smooth without evidence of stenosis or dissection. Vertebral arteries: Patent, smooth, and codominant without evidence of stenosis or dissection. Skeleton: Poor dentition with multiple periapical erosions. Other neck: Borderline enlarged level II lymph nodes bilaterally. Upper chest: Centrilobular emphysema. Small superior mediastinal lymph nodes. Review of the MIP images confirms the above findings CTA HEAD FINDINGS Anterior circulation: The internal carotid arteries  are widely patent from skull base to carotid termini. The A1 and M1 segments are widely patent with the left A1 segment being mildly hypoplastic. There is a decreased number of branch vessels in the right MCA superior division with suggestion of occlusion and intermittent reconstitution of distal M2 and M3 branches. There is also multifocal irregular narrowing of M3 branches in the right MCA inferior division, and there is mild left MCA branch vessel irregularity. No aneurysm is identified. Posterior circulation: The intracranial vertebral arteries are widely patent to the basilar. The basilar artery is widely patent. There are small posterior communicating arteries bilaterally. PCAs are patent with  mild branch vessel irregularity more notable on the right without evidence of flow limiting proximal stenosis. No aneurysm is identified. Venous sinuses: Patent. Anatomic variants: None. Review of the MIP images confirms the above findings CT Brain Perfusion Findings: ASPECTS: 4 CBF (<30%) Volume: 57mL Perfusion (Tmax>6.0s) volume: 41mL Mismatch Volume: 70mL, however the core infarct on noncontrast head CT is not being picked up by the automated processing and the true penumbra is less than 12 mL Infarction Location: Right MCA IMPRESSION: 1. Multiple missing right MCA branch vessels in the superior division with wide patency of the M1 segment and no discrete proximal M2 occlusion identified. 2. Widely patent common carotid, internal carotid, and vertebral arteries. 3. CT perfusion demonstrating at most a small penumbra as detailed above. These results were called by telephone at the time of interpretation on 02/14/2020 at 7:55 p.m. to Dr. Laurence Slate, who verbally acknowledged these results. Electronically Signed   By: Sebastian Ache M.D.   On: 02/14/2020 20:22   CT CEREBRAL PERFUSION W CONTRAST  Result Date: 02/14/2020 CLINICAL DATA:  Stroke. Right-sided weakness and speech disturbance. EXAM: CT ANGIOGRAPHY HEAD AND NECK CT PERFUSION BRAIN TECHNIQUE: Multidetector CT imaging of the head and neck was performed using the standard protocol during bolus administration of intravenous contrast. Multiplanar CT image reconstructions and MIPs were obtained to evaluate the vascular anatomy. Carotid stenosis measurements (when applicable) are obtained utilizing NASCET criteria, using the distal internal carotid diameter as the denominator. Multiphase CT imaging of the brain was performed following IV bolus contrast injection. Subsequent parametric perfusion maps were calculated using RAPID software. CONTRAST:  73mL OMNIPAQUE IOHEXOL 350 MG/ML SOLN; 55mL OMNIPAQUE IOHEXOL 350 MG/ML SOLN COMPARISON:  None. FINDINGS: CTA NECK FINDINGS  Aortic arch: Standard 3 vessel aortic arch with widely patent arch vessel origins. Right carotid system: Patent and smooth without evidence of stenosis or dissection. Left carotid system: Patent and smooth without evidence of stenosis or dissection. Vertebral arteries: Patent, smooth, and codominant without evidence of stenosis or dissection. Skeleton: Poor dentition with multiple periapical erosions. Other neck: Borderline enlarged level II lymph nodes bilaterally. Upper chest: Centrilobular emphysema. Small superior mediastinal lymph nodes. Review of the MIP images confirms the above findings CTA HEAD FINDINGS Anterior circulation: The internal carotid arteries are widely patent from skull base to carotid termini. The A1 and M1 segments are widely patent with the left A1 segment being mildly hypoplastic. There is a decreased number of branch vessels in the right MCA superior division with suggestion of occlusion and intermittent reconstitution of distal M2 and M3 branches. There is also multifocal irregular narrowing of M3 branches in the right MCA inferior division, and there is mild left MCA branch vessel irregularity. No aneurysm is identified. Posterior circulation: The intracranial vertebral arteries are widely patent to the basilar. The basilar artery is widely patent. There are small posterior communicating arteries  bilaterally. PCAs are patent with mild branch vessel irregularity more notable on the right without evidence of flow limiting proximal stenosis. No aneurysm is identified. Venous sinuses: Patent. Anatomic variants: None. Review of the MIP images confirms the above findings CT Brain Perfusion Findings: ASPECTS: 4 CBF (<30%) Volume: 66mL Perfusion (Tmax>6.0s) volume: 64mL Mismatch Volume: 29mL, however the core infarct on noncontrast head CT is not being picked up by the automated processing and the true penumbra is less than 12 mL Infarction Location: Right MCA IMPRESSION: 1. Multiple missing  right MCA branch vessels in the superior division with wide patency of the M1 segment and no discrete proximal M2 occlusion identified. 2. Widely patent common carotid, internal carotid, and vertebral arteries. 3. CT perfusion demonstrating at most a small penumbra as detailed above. These results were called by telephone at the time of interpretation on 02/14/2020 at 7:55 p.m. to Dr. Laurence Slate, who verbally acknowledged these results. Electronically Signed   By: Sebastian Ache M.D.   On: 02/14/2020 20:22   DG Chest Port 1 View  Result Date: 02/14/2020 CLINICAL DATA:  Acute CVA. Evaluate for pneumonia. EXAM: PORTABLE CHEST 1 VIEW COMPARISON:  Radiograph 07/07/2019. Lung apices from CT angiography earlier today. FINDINGS: Ill-defined opacity in the right mid lung abutting the minor fissure. Mild cardiomegaly. Normal mediastinal contours. No pulmonary edema, large pleural effusion or pneumothorax. No acute osseous abnormalities are seen. IMPRESSION: Ill-defined opacity in the right mid lung abutting the minor fissure, suspicious for pneumonia. Mild cardiomegaly. Electronically Signed   By: Narda Rutherford M.D.   On: 02/14/2020 21:58   CT HEAD CODE STROKE WO CONTRAST  Result Date: 02/14/2020 CLINICAL DATA:  Code stroke.  Right-sided weakness. EXAM: CT HEAD WITHOUT CONTRAST TECHNIQUE: Contiguous axial images were obtained from the base of the skull through the vertex without intravenous contrast. COMPARISON:  03/07/2014 FINDINGS: Brain: There is an acute right basal ganglia infarct involving the caudate nucleus, lentiform nucleus, and a portion of the anterior limb of the right internal capsule. There is a subcentimeter focus of acute hemorrhage superficially in the right frontal operculum with a small amount of surrounding low-density, and there is also loss of gray-white differentiation indicative of an acute infarct in the anterior aspect of the right insula and more anterior aspect of the right frontal lobe. There  is a new age indeterminate lacunar infarct in the genu of the left internal capsule. The ventricles are normal in size. There is no midline shift or extra-axial fluid collection. Vascular: Hyperdense right MCA. Skull: No fracture or suspicious osseous lesion. Sinuses/Orbits: Partially visualized small mucous retention cyst in the right maxillary sinus. Clear mastoid air cells. Rightward gaze. Other: None. ASPECTS [facility] Stroke Program Early CT Score) - Ganglionic level infarction (caudate, lentiform nuclei, internal capsule, insula, M1-M3 cortex): 1 - Supraganglionic infarction (M4-M6 cortex): 3 Total score (0-10 with 10 being normal): 4 IMPRESSION: 1. 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 2. ASPECTS is 4. 3. 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 These results were communicated to Dr. Laurence Slate at 7:29 pmon 02/14/2020 by text page via the [facility] messaging system. Electronically Signed   By: Sebastian Ache M.D.   On: 02/14/2020 19:30    EKG: Independently reviewed. Limited by artifact, looks to be sinus rhythm. Appears sinus on monitor as well.    Assessment/Plan   1. Acute CVA  - Presents with weakness, noted  to be aphasic and not following commands initially, and found to have acute right MCA infarct on CT involving frontal lobe, insula, and basal ganglia with small focus of hemorrhage  - Neurology is consulting and much appreciated  - Blood cultures were collected in ED and antibiotics ordered  - Discussed with ID, recommendations much appreciated, advised starting high-dose IV penicillin, continuing vancomycin, and obtaining LP with opening pressure, VDRL, cryptococcus Ag, toxoplasmosis, cell counts, protein, glucose, gram-stain, culture - Pharmacy assisting with IV penicillin, vanc continued, discussed LP with ED physician who try to perform but if unable then IR will be  consulted  - MRI brain ordered, continue cardiac monitoring, and neuro checks    2. HIV/AIDS; syphilis  - CD4 84 and VL 1,810,000 on 01/28/20; RPR titer 1:128 in October 2020  - Continue Biktarvy and Bactrim     DVT prophylaxis: SCDs Code Status: Full  Family Communication: Discussed with patient  Disposition Plan: Pending clinical course and therapy assessments, consult sign-off Consults called: Neurology; discussed with ID Admission status: Inpatient     Briscoe Deutscher, MD [facility] Pager: See [email or web address]  If 7AM-7PM, please contact the daytime attending [email or web address]  02/14/2020, 10:04 PM

## 2020-02-14 NOTE — ED Triage Notes (Addendum)
Pt was brought to triage via [facility].  They reported pt having generalized weakness x 2 days.  States pt was in bathroom and lowered himself to ground.  Denies fall.  As EMS walking off triage RN asking pt questions and pt non-verbal.  EMS states pt soft spoken and they left.  Triage RN continued to ask pt questions and pt not answering questions.  Pt drooling.  R arm drift.  Very difficult to get pt to follow commands.  EMS called back to triage and states this is change in pt condition.  Code Stroke activated.  Pt to bridge and Dr. Donnald Garre at bridge to assess pt.  Pt to CT with primary RN.  LKW 1900.

## 2020-02-14 NOTE — ED Notes (Signed)
Patient over to CT.  In attempt to get out of wheelchair, patient unable to hold self upright with right side.  Patient assisted to CT table with 2 assist.  Patient weak to right side.  Only following some commands.  Right facial droop.  Not responding verbally.

## 2020-02-14 NOTE — Consult Note (Addendum)
Requesting Physician: Dr.Nanavati    Chief Complaint: generalized weakness, aphasia  History obtained from: Patient and Chart  HPI:                                                                                                                                       Tyler Vargas is a 34 y.o. adult male with HIV, male to male gender, syphilis, prior history of cocaine abuse presents to the emergency department initially for generalized weakness. Code stroke was activated at the ED triage after patient suddenly became nonverbal and ED staff noted right-sided weakness (although EDP mentioned that she felt the left side was weak).   History obtained by EDP as patient is aphasic and unable to reach family.  EMS was called to the house for generalized weakness into 2 days.  In triage patient was noted to not be responding to questions and code stroke was activated.  Stat CT head was obtained which showed acute infarcts in the right MCA territory-both subacute and acute, as well as small focus of hemorrhage in the right frontal lobe adjacent to infarct.  CT angiogram was obtained-no large vessel occlusion however branches of right MCA were diminished. Not a candidate for TPA due to hemorrhage and unclear last known normal.  Not a candidate for thrombectomy due to no LVO.   Date last known well:  Time last known well:  tPA Given:  NIHSS:  Baseline MRS  Intracerebral Hemorrhage (ICH) Score  Glascow Coma Score 5-12 +1  Age >/= 80 yes no 0  ICH volume >/= 30ml  no 0  IVH no 0  Infratentorial originno 0 Total:  1   Past Medical History:  Diagnosis Date  . History of syphilis 09/30/2019  . HIV (human immunodeficiency virus infection) (HCC)   . Male-to-male transgender person     Past Surgical History:  Procedure Laterality Date  . APPENDECTOMY      Family History  Problem Relation Age of Onset  . Cancer Neg Hx   . Diabetes Neg Hx    Social History:  reports that she has  been smoking cigarettes. She has been smoking about 0.25 packs per day. She has never used smokeless tobacco. She reports current alcohol use. She reports current drug use. Drugs: Marijuana and Cocaine.  Allergies: No Known Allergies  Medications:  I reviewed home medications   ROS:                                                                                                                                     Unable to review systems due to patient mental status   Examination:                                                                                                      General: Appears well-developed Psych: Unable to obtain Eyes: No scleral injection HENT: No OP obstrucion Head: Normocephalic.  Cardiovascular: Normal rate and regular rhythm. Respiratory: Effort normal and breath sounds normal to anterior ascultation GI: Soft.  No distension. There is no tenderness.  Skin: WDI    Neurological Examination Mental Status: Awake, tracks examiner, sometimes squeezes hands to command. Not answering any questions.  Patient is nonverbal. Cranial Nerves: II: Visual fields: Blinks to threat bilaterally III,IV, VI: ptosis not present, extra-ocular motions intact bilaterally, pupils equal, round, reactive to light and accommodation V,VII: No obvious facial droop, unable to assess facial sensation VIII: Unable to assess IX,X: Unable to assess XI: Unable to assess XII: midline tongue extension Motor: Moves all 4 extremities, can hold against gravity.  Right leg drifts and hits bed downwards within 5 seconds. Tone and bulk:normal tone throughout; no atrophy noted Sensory: Withdraws to noxious symptoms bilaterally Deep Tendon Reflexes: 2+ and symmetric throughout Plantars: Right: downgoing   Left: downgoing Cerebellar: No gross ataxia seen      Lab  Results: Basic Metabolic Panel: Recent Labs  Lab 02/14/20 1905 02/14/20 1911 02/15/20 0052  NA 132* 133* 132*  K 3.7 3.7 3.7  CL 97* 97* 97*  CO2 23  --  24  GLUCOSE 114* 115* 111*  BUN 9 9 7   CREATININE 0.86 0.80 0.88  CALCIUM 8.9  --  8.6*    CBC: Recent Labs  Lab 02/14/20 1905 02/14/20 1911  WBC 11.4*  --   NEUTROABS 9.9*  --   HGB 10.6* 11.2*  HCT 35.0* 33.0*  MCV 95.4  --   PLT 561*  --     Coagulation Studies: Recent Labs    02/14/20 1905  LABPROT 14.6  INR 1.1    Imaging: CT ANGIO HEAD W OR WO CONTRAST  Result Date: 02/14/2020 CLINICAL DATA:  Stroke. Right-sided weakness and speech disturbance. EXAM: CT ANGIOGRAPHY HEAD AND NECK CT PERFUSION BRAIN TECHNIQUE: Multidetector CT imaging of the head and neck was performed using the standard  protocol during bolus administration of intravenous contrast. Multiplanar CT image reconstructions and MIPs were obtained to evaluate the vascular anatomy. Carotid stenosis measurements (when applicable) are obtained utilizing NASCET criteria, using the distal internal carotid diameter as the denominator. Multiphase CT imaging of the brain was performed following IV bolus contrast injection. Subsequent parametric perfusion maps were calculated using RAPID software. CONTRAST:  30mL OMNIPAQUE IOHEXOL 350 MG/ML SOLN; 45mL OMNIPAQUE IOHEXOL 350 MG/ML SOLN COMPARISON:  None. FINDINGS: CTA NECK FINDINGS Aortic arch: Standard 3 vessel aortic arch with widely patent arch vessel origins. Right carotid system: Patent and smooth without evidence of stenosis or dissection. Left carotid system: Patent and smooth without evidence of stenosis or dissection. Vertebral arteries: Patent, smooth, and codominant without evidence of stenosis or dissection. Skeleton: Poor dentition with multiple periapical erosions. Other neck: Borderline enlarged level II lymph nodes bilaterally. Upper chest: Centrilobular emphysema. Small superior mediastinal lymph nodes.  Review of the MIP images confirms the above findings CTA HEAD FINDINGS Anterior circulation: The internal carotid arteries are widely patent from skull base to carotid termini. The A1 and M1 segments are widely patent with the left A1 segment being mildly hypoplastic. There is a decreased number of branch vessels in the right MCA superior division with suggestion of occlusion and intermittent reconstitution of distal M2 and M3 branches. There is also multifocal irregular narrowing of M3 branches in the right MCA inferior division, and there is mild left MCA branch vessel irregularity. No aneurysm is identified. Posterior circulation: The intracranial vertebral arteries are widely patent to the basilar. The basilar artery is widely patent. There are small posterior communicating arteries bilaterally. PCAs are patent with mild branch vessel irregularity more notable on the right without evidence of flow limiting proximal stenosis. No aneurysm is identified. Venous sinuses: Patent. Anatomic variants: None. Review of the MIP images confirms the above findings CT Brain Perfusion Findings: ASPECTS: 4 CBF (<30%) Volume: 33mL Perfusion (Tmax>6.0s) volume: 3mL Mismatch Volume: 39mL, however the core infarct on noncontrast head CT is not being picked up by the automated processing and the true penumbra is less than 12 mL Infarction Location: Right MCA IMPRESSION: 1. Multiple missing right MCA branch vessels in the superior division with wide patency of the M1 segment and no discrete proximal M2 occlusion identified. 2. Widely patent common carotid, internal carotid, and vertebral arteries. 3. CT perfusion demonstrating at most a small penumbra as detailed above. These results were called by telephone at the time of interpretation on 02/14/2020 at 7:55 p.m. to Dr. Laurence Slate, who verbally acknowledged these results. Electronically Signed   By: Sebastian Ache M.D.   On: 02/14/2020 20:22   CT ANGIO NECK W OR WO CONTRAST  Result  Date: 02/14/2020 CLINICAL DATA:  Stroke. Right-sided weakness and speech disturbance. EXAM: CT ANGIOGRAPHY HEAD AND NECK CT PERFUSION BRAIN TECHNIQUE: Multidetector CT imaging of the head and neck was performed using the standard protocol during bolus administration of intravenous contrast. Multiplanar CT image reconstructions and MIPs were obtained to evaluate the vascular anatomy. Carotid stenosis measurements (when applicable) are obtained utilizing NASCET criteria, using the distal internal carotid diameter as the denominator. Multiphase CT imaging of the brain was performed following IV bolus contrast injection. Subsequent parametric perfusion maps were calculated using RAPID software. CONTRAST:  72mL OMNIPAQUE IOHEXOL 350 MG/ML SOLN; 100mL OMNIPAQUE IOHEXOL 350 MG/ML SOLN COMPARISON:  None. FINDINGS: CTA NECK FINDINGS Aortic arch: Standard 3 vessel aortic arch with widely patent arch vessel origins. Right carotid system: Patent and smooth without  evidence of stenosis or dissection. Left carotid system: Patent and smooth without evidence of stenosis or dissection. Vertebral arteries: Patent, smooth, and codominant without evidence of stenosis or dissection. Skeleton: Poor dentition with multiple periapical erosions. Other neck: Borderline enlarged level II lymph nodes bilaterally. Upper chest: Centrilobular emphysema. Small superior mediastinal lymph nodes. Review of the MIP images confirms the above findings CTA HEAD FINDINGS Anterior circulation: The internal carotid arteries are widely patent from skull base to carotid termini. The A1 and M1 segments are widely patent with the left A1 segment being mildly hypoplastic. There is a decreased number of branch vessels in the right MCA superior division with suggestion of occlusion and intermittent reconstitution of distal M2 and M3 branches. There is also multifocal irregular narrowing of M3 branches in the right MCA inferior division, and there is mild left MCA  branch vessel irregularity. No aneurysm is identified. Posterior circulation: The intracranial vertebral arteries are widely patent to the basilar. The basilar artery is widely patent. There are small posterior communicating arteries bilaterally. PCAs are patent with mild branch vessel irregularity more notable on the right without evidence of flow limiting proximal stenosis. No aneurysm is identified. Venous sinuses: Patent. Anatomic variants: None. Review of the MIP images confirms the above findings CT Brain Perfusion Findings: ASPECTS: 4 CBF (<30%) Volume: 40mL Perfusion (Tmax>6.0s) volume: 49mL Mismatch Volume: 78mL, however the core infarct on noncontrast head CT is not being picked up by the automated processing and the true penumbra is less than 12 mL Infarction Location: Right MCA IMPRESSION: 1. Multiple missing right MCA branch vessels in the superior division with wide patency of the M1 segment and no discrete proximal M2 occlusion identified. 2. Widely patent common carotid, internal carotid, and vertebral arteries. 3. CT perfusion demonstrating at most a small penumbra as detailed above. These results were called by telephone at the time of interpretation on 02/14/2020 at 7:55 p.m. to Dr. Laurence Slate, who verbally acknowledged these results. Electronically Signed   By: Sebastian Ache M.D.   On: 02/14/2020 20:22   CT CEREBRAL PERFUSION W CONTRAST  Result Date: 02/14/2020 CLINICAL DATA:  Stroke. Right-sided weakness and speech disturbance. EXAM: CT ANGIOGRAPHY HEAD AND NECK CT PERFUSION BRAIN TECHNIQUE: Multidetector CT imaging of the head and neck was performed using the standard protocol during bolus administration of intravenous contrast. Multiplanar CT image reconstructions and MIPs were obtained to evaluate the vascular anatomy. Carotid stenosis measurements (when applicable) are obtained utilizing NASCET criteria, using the distal internal carotid diameter as the denominator. Multiphase CT imaging of the  brain was performed following IV bolus contrast injection. Subsequent parametric perfusion maps were calculated using RAPID software. CONTRAST:  38mL OMNIPAQUE IOHEXOL 350 MG/ML SOLN; 65mL OMNIPAQUE IOHEXOL 350 MG/ML SOLN COMPARISON:  None. FINDINGS: CTA NECK FINDINGS Aortic arch: Standard 3 vessel aortic arch with widely patent arch vessel origins. Right carotid system: Patent and smooth without evidence of stenosis or dissection. Left carotid system: Patent and smooth without evidence of stenosis or dissection. Vertebral arteries: Patent, smooth, and codominant without evidence of stenosis or dissection. Skeleton: Poor dentition with multiple periapical erosions. Other neck: Borderline enlarged level II lymph nodes bilaterally. Upper chest: Centrilobular emphysema. Small superior mediastinal lymph nodes. Review of the MIP images confirms the above findings CTA HEAD FINDINGS Anterior circulation: The internal carotid arteries are widely patent from skull base to carotid termini. The A1 and M1 segments are widely patent with the left A1 segment being mildly hypoplastic. There is a decreased number of branch  vessels in the right MCA superior division with suggestion of occlusion and intermittent reconstitution of distal M2 and M3 branches. There is also multifocal irregular narrowing of M3 branches in the right MCA inferior division, and there is mild left MCA branch vessel irregularity. No aneurysm is identified. Posterior circulation: The intracranial vertebral arteries are widely patent to the basilar. The basilar artery is widely patent. There are small posterior communicating arteries bilaterally. PCAs are patent with mild branch vessel irregularity more notable on the right without evidence of flow limiting proximal stenosis. No aneurysm is identified. Venous sinuses: Patent. Anatomic variants: None. Review of the MIP images confirms the above findings CT Brain Perfusion Findings: ASPECTS: 4 CBF (<30%) Volume:  24mL Perfusion (Tmax>6.0s) volume: 39mL Mismatch Volume: 69mL, however the core infarct on noncontrast head CT is not being picked up by the automated processing and the true penumbra is less than 12 mL Infarction Location: Right MCA IMPRESSION: 1. Multiple missing right MCA branch vessels in the superior division with wide patency of the M1 segment and no discrete proximal M2 occlusion identified. 2. Widely patent common carotid, internal carotid, and vertebral arteries. 3. CT perfusion demonstrating at most a small penumbra as detailed above. These results were called by telephone at the time of interpretation on 02/14/2020 at 7:55 p.m. to Dr. Laurence Slate, who verbally acknowledged these results. Electronically Signed   By: Sebastian Ache M.D.   On: 02/14/2020 20:22   DG Chest Port 1 View  Result Date: 02/14/2020 CLINICAL DATA:  Acute CVA. Evaluate for pneumonia. EXAM: PORTABLE CHEST 1 VIEW COMPARISON:  Radiograph 07/07/2019. Lung apices from CT angiography earlier today. FINDINGS: Ill-defined opacity in the right mid lung abutting the minor fissure. Mild cardiomegaly. Normal mediastinal contours. No pulmonary edema, large pleural effusion or pneumothorax. No acute osseous abnormalities are seen. IMPRESSION: Ill-defined opacity in the right mid lung abutting the minor fissure, suspicious for pneumonia. Mild cardiomegaly. Electronically Signed   By: Narda Rutherford M.D.   On: 02/14/2020 21:58   DG Abd Portable 1V  Result Date: 02/15/2020 CLINICAL DATA:  MRI clearance EXAM: PORTABLE ABDOMEN - 1 VIEW COMPARISON:  None. FINDINGS: The bowel gas pattern is normal. No radio-opaque calculi or other significant radiographic abnormality are seen. IMPRESSION: No radial pain metallic foreign body in the abdomen or pelvis. Electronically Signed   By: Elige Ko   On: 02/15/2020 05:57   CT HEAD CODE STROKE WO CONTRAST  Result Date: 02/14/2020 CLINICAL DATA:  Code stroke.  Right-sided weakness. EXAM: CT HEAD WITHOUT  CONTRAST TECHNIQUE: Contiguous axial images were obtained from the base of the skull through the vertex without intravenous contrast. COMPARISON:  03/07/2014 FINDINGS: Brain: There is an acute right basal ganglia infarct involving the caudate nucleus, lentiform nucleus, and a portion of the anterior limb of the right internal capsule. There is a subcentimeter focus of acute hemorrhage superficially in the right frontal operculum with a small amount of surrounding low-density, and there is also loss of gray-white differentiation indicative of an acute infarct in the anterior aspect of the right insula and more anterior aspect of the right frontal lobe. There is a new age indeterminate lacunar infarct in the genu of the left internal capsule. The ventricles are normal in size. There is no midline shift or extra-axial fluid collection. Vascular: Hyperdense right MCA. Skull: No fracture or suspicious osseous lesion. Sinuses/Orbits: Partially visualized small mucous retention cyst in the right maxillary sinus. Clear mastoid air cells. Rightward gaze. Other: None. ASPECTS [facility] Stroke  Program Early CT Score) - Ganglionic level infarction (caudate, lentiform nuclei, internal capsule, insula, M1-M3 cortex): 1 - Supraganglionic infarction (M4-M6 cortex): 3 Total score (0-10 with 10 being normal): 4 IMPRESSION: 1. 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 2. ASPECTS is 4. 3. 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 These results were communicated to Dr. Lorraine Lax at 7:29 pmon 02/14/2020 by text page via the [facility] messaging system. Electronically Signed   By: Logan Bores M.D.   On: 02/14/2020 19:30     ASSESSMENT AND PLAN  33y male with HIV's, syphilis, cocaine abuse brought to ED by EMS for Generalized weakness x 2 days, at bridge developed sudden onset aphasia, possible right side weakness and  so code stroke activated.  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 - not a tPA candidate due to bleed. CTA negative for LVO, likely distal occlusion.  CTP- no meaningful penumbra UDS cocaine +ve  Impression:  Acute ischemic stroke with small hemorrhagic conversion ? H/o of HIV/AIDS possible septic emboli Acute metabolic encephalopathy secondary to sepsis Possible seizure  Recommendations #Routine EEG in the morning #Blood cultures, infectious work-up # MRI of the brain without contrast #Transthoracic Echo, possibly TEE #No aspirin/anticoagulants # BP goal: upto 762 systolic  # HBAIC and Lipid profile # Telemetry monitoring # Frequent neuro checks # Stroke swallow screen  Please page stroke NP  Or  PA  Or MD from 8am -4 pm  as this patient from this time will be  followed by the stroke.   You can look them up on [email or web address]  Password [email or web address]  Addendum -Patient COVID-19 positive   Ilya Ess [facility] Pager Number [phone]

## 2020-02-14 NOTE — Progress Notes (Addendum)
Pharmacy Antibiotic Note  Tyler Vargas is a 34 y.o. adult admitted on 02/14/2020 with sepsis and Septic emboli .  Pharmacy has been consulted for Cefepime and Vacomycin dosing.   Weight: 149 lb 4 oz (67.7 kg)  Temp (24hrs), Avg:98 F (36.7 C), Min:98 F (36.7 C), Max:98 F (36.7 C)  Recent Labs  Lab 02/14/20 1905 02/14/20 1911  WBC 11.4*  --   CREATININE 0.86 0.80    Estimated Creatinine Clearance (by C-G formula based on SCr of 0.8 mg/dL) Male: 403.5 mL/min Male: 125.8 mL/min    No Known Allergies  Antimicrobials this admission: 3/27 Cefepime >>  3/27 Vancomycin >>   Dose adjustments this admission: N/a  Microbiology results: Pending   Plan:  - Cefepime 2g IV q8h - Vancomycin 1500 mg IV x 1 dose  - Followed by Vancomycin 1000 mg IV q8h - Septic will target a trough of 15-20 mg/dl - Monitor patients renal function and urine output  - De-escalate ABX when appropriate   Thank you for allowing pharmacy to be a part of this patient's care.  Joaquim Lai PharmD. BCPS 02/14/2020 9:21 PM

## 2020-02-14 NOTE — ED Provider Notes (Addendum)
[facility]   CSN: [id number] Arrival date & time: 02/14/20  1851  An emergency department physician performed an initial assessment on this suspected stroke patient at 1910.  History Chief Complaint  Patient presents with  . Code Stroke    Tyler Vargas is a 34 y.o. adult.  HPI    Level 5 caveat for altered mental status.  34 year old-year-old male transgender patient with history of HIV and syphilis comes in with chief complaint of altered mental status.  There is also prior history of cocaine use.  I called patient's home line and her father's phone line without any response.  Patient was brought here via EMS with chief complaint of weakness for 2 days.  EMS did not have meaningful history besides weakness for 2 days.  At triage patient was noted to have weakness and she was not responding to questions or following commands and code stroke was activated.  Patient is still not answering any questions.  She is following simple commands like opening the eyes on verbal command.  Past Medical History:  Diagnosis Date  . History of syphilis 09/30/2019  . HIV (human immunodeficiency virus infection) (HCC)   . Male-to-male transgender person     Patient Active Problem List   Diagnosis Date Noted  . Acute CVA (cerebrovascular accident) (HCC) 02/14/2020  . Cocaine use disorder, mild, abuse (HCC) 01/29/2020  . Psoriasis 09/30/2019  . 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 HIV (human immunodeficiency virus infection) (HCC) 09/30/2019  . AIDS (acquired immune deficiency syndrome) (HCC) 09/30/2019  . History of syphilis 09/30/2019  . Male-to-male transgender person 09/30/2019  . Depression 09/30/2019    Past Surgical History:  Procedure Laterality Date  . APPENDECTOMY       OB History   No obstetric history on file.     Family History  Problem Relation Age of Onset  . Cancer Neg Hx   . Diabetes Neg Hx     Social  History   Tobacco Use  . Smoking status: Current Every Day Smoker    Packs/day: 0.25    Types: Cigarettes  . Smokeless tobacco: Never Used  . Tobacco comment: cutting back  Substance Use Topics  . Alcohol use: Yes    Comment: every 2-3 days; beer, vodka  . Drug use: Yes    Types: Marijuana, Cocaine    Comment: cocaine past weekend (2 times a week), Marijuana 3-4 times a week    Home Medications Prior to Admission medications   Medication Sig Start Date End Date Taking? Authorizing Provider  bictegravir-emtricitabine-tenofovir AF (BIKTARVY) 50-200-25 MG TABS tablet Take 1 tablet by mouth daily. Try to take at the same time each day with or without food. 01/28/20   Blanchard Kelchixon, Stephanie N, NP  sulfamethoxazole-trimethoprim (BACTRIM DS) 800-160 MG tablet Take 1 tablet by mouth daily. 01/28/20   Blanchard Kelchixon, Stephanie N, NP    Allergies    Patient has no known allergies.  Review of Systems   Review of Systems  Unable to perform ROS: Patient unresponsive    Physical Exam Updated Vital Signs BP 132/83   Pulse 89   Temp 98 F (36.7 C) (Oral)   Resp (!) 22   Wt 65.9 kg   SpO2 100%   BMI 22.09 kg/m   Physical Exam Vitals and nursing note reviewed.  Constitutional:      Appearance: She is well-developed.     Comments: Unresponsive  HENT:     Head: Normocephalic and atraumatic.  Eyes:     Conjunctiva/sclera: Conjunctivae normal.     Pupils: Pupils are equal, round, and reactive to light.     Comments: 2 mm and equal  Cardiovascular:     Rate and Rhythm: Normal rate and regular rhythm.  Pulmonary:     Effort: Pulmonary effort is normal.     Breath sounds: Normal breath sounds.  Abdominal:     General: Bowel sounds are normal.     Palpations: Abdomen is soft.     Tenderness: There is no abdominal tenderness.  Musculoskeletal:        General: No deformity.     Cervical back: Normal range of motion and neck supple.  Neurological:     Comments: Patient is noted to have subtle  right-sided weakness.  On my exam I do not appreciate any facial droop.  Patient is moving all 4 extremities, but right upper extremity appears slightly weak.  Patient is following commands.  She did not participate in gaze evaluation/peripheral vision evaluation and finger-to-nose exam.  Patient nonverbal.     ED Results / Procedures / Treatments   Labs (all labs ordered are listed, but only abnormal results are displayed) Labs Reviewed  CBC - Abnormal; Notable for the following components:      Result Value   WBC 11.4 (*)    RBC 3.67 (*)    Hemoglobin 10.6 (*)    HCT 35.0 (*)    RDW 15.7 (*)    Platelets 561 (*)    All other components within normal limits  DIFFERENTIAL - Abnormal; Notable for the following components:   Neutro Abs 9.9 (*)    Abs Immature Granulocytes 0.14 (*)    All other components within normal limits  COMPREHENSIVE METABOLIC PANEL - Abnormal; Notable for the following components:   Sodium 132 (*)    Chloride 97 (*)    Glucose, Bld 114 (*)    Total Protein 9.4 (*)    Albumin 2.3 (*)    All other components within normal limits  RAPID URINE DRUG SCREEN, HOSP PERFORMED - Abnormal; Notable for the following components:   Cocaine POSITIVE (*)    All other components within normal limits  URINALYSIS, ROUTINE W REFLEX MICROSCOPIC - Abnormal; Notable for the following components:   Color, Urine STRAW (*)    Specific Gravity, Urine >1.046 (*)    Hgb urine dipstick SMALL (*)    All other components within normal limits  I-STAT CHEM 8, ED - Abnormal; Notable for the following components:   Sodium 133 (*)    Chloride 97 (*)    Glucose, Bld 115 (*)    Calcium, Ion 1.13 (*)    Hemoglobin 11.2 (*)    HCT 33.0 (*)    All other components within normal limits  CULTURE, BLOOD (ROUTINE X 2)  CULTURE, BLOOD (ROUTINE X 2)  URINE CULTURE  CSF CULTURE  SARS CORONAVIRUS 2 (TAT 6-24 HRS)  PROTIME-INR  APTT  LACTIC ACID, PLASMA  CRYPTOCOCCAL ANTIGEN  LACTIC ACID,  PLASMA  RPR  VDRL, CSF  CRYPTOCOCCAL ANTIGEN, CSF  CSF CELL COUNT WITH DIFFERENTIAL  PROTEIN AND GLUCOSE, CSF  HEMOGLOBIN A1C  LIPID PANEL  COMPREHENSIVE METABOLIC PANEL  CSF CELL COUNT WITH DIFFERENTIAL  CBG MONITORING, ED    EKG EKG Interpretation  Date/Time:  Saturday February 14 2020 20:07:16 EDT Ventricular Rate:  85 PR Interval:    QRS Duration: 89 QT Interval:  354 QTC Calculation: 421 R Axis:   58 Text  Interpretation: Atrial fibrillation No acute changes No significant change since last tracing Confirmed by Derwood Kaplan [phone]) on 02/14/2020 9:29:16 PM   Radiology CT ANGIO HEAD W OR WO CONTRAST  Result Date: 02/14/2020 CLINICAL DATA:  Stroke. Right-sided weakness and speech disturbance. EXAM: CT ANGIOGRAPHY HEAD AND NECK CT PERFUSION BRAIN TECHNIQUE: Multidetector CT imaging of the head and neck was performed using the standard protocol during bolus administration of intravenous contrast. Multiplanar CT image reconstructions and MIPs were obtained to evaluate the vascular anatomy. Carotid stenosis measurements (when applicable) are obtained utilizing NASCET criteria, using the distal internal carotid diameter as the denominator. Multiphase CT imaging of the brain was performed following IV bolus contrast injection. Subsequent parametric perfusion maps were calculated using RAPID software. CONTRAST:  40mL OMNIPAQUE IOHEXOL 350 MG/ML SOLN; 75mL OMNIPAQUE IOHEXOL 350 MG/ML SOLN COMPARISON:  None. FINDINGS: CTA NECK FINDINGS Aortic arch: Standard 3 vessel aortic arch with widely patent arch vessel origins. Right carotid system: Patent and smooth without evidence of stenosis or dissection. Left carotid system: Patent and smooth without evidence of stenosis or dissection. Vertebral arteries: Patent, smooth, and codominant without evidence of stenosis or dissection. Skeleton: Poor dentition with multiple periapical erosions. Other neck: Borderline enlarged level II lymph nodes  bilaterally. Upper chest: Centrilobular emphysema. Small superior mediastinal lymph nodes. Review of the MIP images confirms the above findings CTA HEAD FINDINGS Anterior circulation: The internal carotid arteries are widely patent from skull base to carotid termini. The A1 and M1 segments are widely patent with the left A1 segment being mildly hypoplastic. There is a decreased number of branch vessels in the right MCA superior division with suggestion of occlusion and intermittent reconstitution of distal M2 and M3 branches. There is also multifocal irregular narrowing of M3 branches in the right MCA inferior division, and there is mild left MCA branch vessel irregularity. No aneurysm is identified. Posterior circulation: The intracranial vertebral arteries are widely patent to the basilar. The basilar artery is widely patent. There are small posterior communicating arteries bilaterally. PCAs are patent with mild branch vessel irregularity more notable on the right without evidence of flow limiting proximal stenosis. No aneurysm is identified. Venous sinuses: Patent. Anatomic variants: None. Review of the MIP images confirms the above findings CT Brain Perfusion Findings: ASPECTS: 4 CBF (<30%) Volume: 0mL Perfusion (Tmax>6.0s) volume: 12mL Mismatch Volume: 12mL, however the core infarct on noncontrast head CT is not being picked up by the automated processing and the true penumbra is less than 12 mL Infarction Location: Right MCA IMPRESSION: 1. Multiple missing right MCA branch vessels in the superior division with wide patency of the M1 segment and no discrete proximal M2 occlusion identified. 2. Widely patent common carotid, internal carotid, and vertebral arteries. 3. CT perfusion demonstrating at most a small penumbra as detailed above. These results were called by telephone at the time of interpretation on 02/14/2020 at 7:55 p.m. to Dr. Laurence Slate, who verbally acknowledged these results. Electronically Signed   By:  Sebastian Ache M.D.   On: 02/14/2020 20:22   CT ANGIO NECK W OR WO CONTRAST  Result Date: 02/14/2020 CLINICAL DATA:  Stroke. Right-sided weakness and speech disturbance. EXAM: CT ANGIOGRAPHY HEAD AND NECK CT PERFUSION BRAIN TECHNIQUE: Multidetector CT imaging of the head and neck was performed using the standard protocol during bolus administration of intravenous contrast. Multiplanar CT image reconstructions and MIPs were obtained to evaluate the vascular anatomy. Carotid stenosis measurements (when applicable) are obtained utilizing NASCET criteria, using the distal internal carotid  diameter as the denominator. Multiphase CT imaging of the brain was performed following IV bolus contrast injection. Subsequent parametric perfusion maps were calculated using RAPID software. CONTRAST:  70mL OMNIPAQUE IOHEXOL 350 MG/ML SOLN; 77mL OMNIPAQUE IOHEXOL 350 MG/ML SOLN COMPARISON:  None. FINDINGS: CTA NECK FINDINGS Aortic arch: Standard 3 vessel aortic arch with widely patent arch vessel origins. Right carotid system: Patent and smooth without evidence of stenosis or dissection. Left carotid system: Patent and smooth without evidence of stenosis or dissection. Vertebral arteries: Patent, smooth, and codominant without evidence of stenosis or dissection. Skeleton: Poor dentition with multiple periapical erosions. Other neck: Borderline enlarged level II lymph nodes bilaterally. Upper chest: Centrilobular emphysema. Small superior mediastinal lymph nodes. Review of the MIP images confirms the above findings CTA HEAD FINDINGS Anterior circulation: The internal carotid arteries are widely patent from skull base to carotid termini. The A1 and M1 segments are widely patent with the left A1 segment being mildly hypoplastic. There is a decreased number of branch vessels in the right MCA superior division with suggestion of occlusion and intermittent reconstitution of distal M2 and M3 branches. There is also multifocal irregular  narrowing of M3 branches in the right MCA inferior division, and there is mild left MCA branch vessel irregularity. No aneurysm is identified. Posterior circulation: The intracranial vertebral arteries are widely patent to the basilar. The basilar artery is widely patent. There are small posterior communicating arteries bilaterally. PCAs are patent with mild branch vessel irregularity more notable on the right without evidence of flow limiting proximal stenosis. No aneurysm is identified. Venous sinuses: Patent. Anatomic variants: None. Review of the MIP images confirms the above findings CT Brain Perfusion Findings: ASPECTS: 4 CBF (<30%) Volume: 26mL Perfusion (Tmax>6.0s) volume: 22mL Mismatch Volume: 57mL, however the core infarct on noncontrast head CT is not being picked up by the automated processing and the true penumbra is less than 12 mL Infarction Location: Right MCA IMPRESSION: 1. Multiple missing right MCA branch vessels in the superior division with wide patency of the M1 segment and no discrete proximal M2 occlusion identified. 2. Widely patent common carotid, internal carotid, and vertebral arteries. 3. CT perfusion demonstrating at most a small penumbra as detailed above. These results were called by telephone at the time of interpretation on 02/14/2020 at 7:55 p.m. to Dr. Lorraine Lax, who verbally acknowledged these results. Electronically Signed   By: Logan Bores M.D.   On: 02/14/2020 20:22   CT CEREBRAL PERFUSION W CONTRAST  Result Date: 02/14/2020 CLINICAL DATA:  Stroke. Right-sided weakness and speech disturbance. EXAM: CT ANGIOGRAPHY HEAD AND NECK CT PERFUSION BRAIN TECHNIQUE: Multidetector CT imaging of the head and neck was performed using the standard protocol during bolus administration of intravenous contrast. Multiplanar CT image reconstructions and MIPs were obtained to evaluate the vascular anatomy. Carotid stenosis measurements (when applicable) are obtained utilizing NASCET criteria,  using the distal internal carotid diameter as the denominator. Multiphase CT imaging of the brain was performed following IV bolus contrast injection. Subsequent parametric perfusion maps were calculated using RAPID software. CONTRAST:  56mL OMNIPAQUE IOHEXOL 350 MG/ML SOLN; 15mL OMNIPAQUE IOHEXOL 350 MG/ML SOLN COMPARISON:  None. FINDINGS: CTA NECK FINDINGS Aortic arch: Standard 3 vessel aortic arch with widely patent arch vessel origins. Right carotid system: Patent and smooth without evidence of stenosis or dissection. Left carotid system: Patent and smooth without evidence of stenosis or dissection. Vertebral arteries: Patent, smooth, and codominant without evidence of stenosis or dissection. Skeleton: Poor dentition with multiple periapical erosions. Other  neck: Borderline enlarged level II lymph nodes bilaterally. Upper chest: Centrilobular emphysema. Small superior mediastinal lymph nodes. Review of the MIP images confirms the above findings CTA HEAD FINDINGS Anterior circulation: The internal carotid arteries are widely patent from skull base to carotid termini. The A1 and M1 segments are widely patent with the left A1 segment being mildly hypoplastic. There is a decreased number of branch vessels in the right MCA superior division with suggestion of occlusion and intermittent reconstitution of distal M2 and M3 branches. There is also multifocal irregular narrowing of M3 branches in the right MCA inferior division, and there is mild left MCA branch vessel irregularity. No aneurysm is identified. Posterior circulation: The intracranial vertebral arteries are widely patent to the basilar. The basilar artery is widely patent. There are small posterior communicating arteries bilaterally. PCAs are patent with mild branch vessel irregularity more notable on the right without evidence of flow limiting proximal stenosis. No aneurysm is identified. Venous sinuses: Patent. Anatomic variants: None. Review of the MIP  images confirms the above findings CT Brain Perfusion Findings: ASPECTS: 4 CBF (<30%) Volume: 62mL Perfusion (Tmax>6.0s) volume: 54mL Mismatch Volume: 39mL, however the core infarct on noncontrast head CT is not being picked up by the automated processing and the true penumbra is less than 12 mL Infarction Location: Right MCA IMPRESSION: 1. Multiple missing right MCA branch vessels in the superior division with wide patency of the M1 segment and no discrete proximal M2 occlusion identified. 2. Widely patent common carotid, internal carotid, and vertebral arteries. 3. CT perfusion demonstrating at most a small penumbra as detailed above. These results were called by telephone at the time of interpretation on 02/14/2020 at 7:55 p.m. to Dr. Laurence Slate, who verbally acknowledged these results. Electronically Signed   By: Sebastian Ache M.D.   On: 02/14/2020 20:22   DG Chest Port 1 View  Result Date: 02/14/2020 CLINICAL DATA:  Acute CVA. Evaluate for pneumonia. EXAM: PORTABLE CHEST 1 VIEW COMPARISON:  Radiograph 07/07/2019. Lung apices from CT angiography earlier today. FINDINGS: Ill-defined opacity in the right mid lung abutting the minor fissure. Mild cardiomegaly. Normal mediastinal contours. No pulmonary edema, large pleural effusion or pneumothorax. No acute osseous abnormalities are seen. IMPRESSION: Ill-defined opacity in the right mid lung abutting the minor fissure, suspicious for pneumonia. Mild cardiomegaly. Electronically Signed   By: Narda Rutherford M.D.   On: 02/14/2020 21:58   CT HEAD CODE STROKE WO CONTRAST  Result Date: 02/14/2020 CLINICAL DATA:  Code stroke.  Right-sided weakness. EXAM: CT HEAD WITHOUT CONTRAST TECHNIQUE: Contiguous axial images were obtained from the base of the skull through the vertex without intravenous contrast. COMPARISON:  03/07/2014 FINDINGS: Brain: There is an acute right basal ganglia infarct involving the caudate nucleus, lentiform nucleus, and a portion of the anterior limb  of the right internal capsule. There is a subcentimeter focus of acute hemorrhage superficially in the right frontal operculum with a small amount of surrounding low-density, and there is also loss of gray-white differentiation indicative of an acute infarct in the anterior aspect of the right insula and more anterior aspect of the right frontal lobe. There is a new age indeterminate lacunar infarct in the genu of the left internal capsule. The ventricles are normal in size. There is no midline shift or extra-axial fluid collection. Vascular: Hyperdense right MCA. Skull: No fracture or suspicious osseous lesion. Sinuses/Orbits: Partially visualized small mucous retention cyst in the right maxillary sinus. Clear mastoid air cells. Rightward gaze. Other: None. ASPECTS [facility]  Stroke Program Early CT Score) - Ganglionic level infarction (caudate, lentiform nuclei, internal capsule, insula, M1-M3 cortex): 1 - Supraganglionic infarction (M4-M6 cortex): 3 Total score (0-10 with 10 being normal): 4 IMPRESSION: 1. 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 2. ASPECTS is 4. 3. 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 These results were communicated to Dr. Laurence Slate at 7:29 pmon 02/14/2020 by text page via the [facility] messaging system. Electronically Signed   By: Sebastian Ache M.D.   On: 02/14/2020 19:30    Procedures .Critical Care Performed by: Derwood Kaplan, MD Authorized by: Derwood Kaplan, MD   Critical care provider statement:    Critical care time (minutes):  36   Critical care was necessary to treat or prevent imminent or life-threatening deterioration of the following conditions:  CNS failure or compromise   Critical care was time spent personally by me on the following activities:  Discussions with consultants, evaluation of patient's response to treatment, examination of patient,  ordering and performing treatments and interventions, ordering and review of laboratory studies, ordering and review of radiographic studies, pulse oximetry, re-evaluation of patient's condition, obtaining history from patient or surrogate and review of old charts  .Lumbar Puncture  Date/Time: 02/15/2020 12:00 AM Performed by: Derwood Kaplan, MD Authorized by: Derwood Kaplan, MD   Consent:    Consent obtained:  Emergent situation Sedation:    Sedation type:  Anxiolysis Anesthesia (see MAR for exact dosages):    Anesthesia method:  Local infiltration   Local anesthetic:  Lidocaine 1% WITH epi Procedure details:    Lumbar space:  L4-L5 interspace   Patient position:  R lateral decubitus   Needle gauge:  20   Ultrasound guidance: no     Number of attempts:  1   Opening pressure (cm H2O):  22   Fluid appearance:  Clear   Tubes of fluid:  4   Total volume (ml):  15 Post-procedure:    Puncture site:  Adhesive bandage applied   (including critical care time)  Medications Ordered in ED Medications  vancomycin (VANCOREADY) IVPB 1500 mg/300 mL (1,500 mg Intravenous New Bag/Given 02/14/20 2214)    Followed by  vancomycin (VANCOCIN) IVPB 1000 mg/200 mL premix (has no administration in time range)  0.9 %  sodium chloride infusion ( Intravenous New Bag/Given 02/14/20 2215)  penicillin G potassium 4 Million Units in dextrose 5 % 250 mL IVPB (0 Million Units Intravenous Stopped 02/14/20 2358)  bictegravir-emtricitabine-tenofovir AF (BIKTARVY) 50-200-25 MG per tablet 1 tablet (has no administration in time range)  sulfamethoxazole-trimethoprim (BACTRIM DS) 800-160 MG per tablet 1 tablet (has no administration in time range)   stroke: mapping our early stages of recovery book (0 each Does not apply Hold 02/14/20 2254)  acetaminophen (TYLENOL) tablet 650 mg (has no administration in time range)    Or  acetaminophen (TYLENOL) 160 MG/5ML solution 650 mg (has no administration in time range)    Or    acetaminophen (TYLENOL) suppository 650 mg (has no administration in time range)  senna-docusate (Senokot-S) tablet 1 tablet (has no administration in time range)  sodium chloride flush (NS) 0.9 % injection 3 mL (3 mLs Intravenous Given 02/14/20 2214)  iohexol (OMNIPAQUE) 350 MG/ML injection 75 mL (75 mLs Intravenous Contrast Given 02/14/20 1934)  iohexol (OMNIPAQUE) 350 MG/ML injection 40 mL (40 mLs Intravenous Contrast Given 02/14/20 1942)  midazolam (VERSED) injection 2 mg (2 mg  Intravenous Given 02/14/20 2312)    ED Course  I have reviewed the triage vital signs and the nursing notes.  Pertinent labs & imaging results that were available during my care of the patient were reviewed by me and considered in my medical decision making (see chart for details).    MDM Rules/Calculators/A&P                      Patient with history of AIDS, going through male to male transition, prior history of cocaine comes in a chief complaint of altered mental status.  History is unclear.  I have called patient's father, no response.  It appears that patient was noted to have right-sided facial droop at some point.  She is unresponsive.  Could be expressive aphasia.  She is noted to have some right upper extremity weakness on my exam.  Code stroke was activated at bridge and it reveals hemorrhagic conversion and possible MCA stroke.  There is concerns that patient might have septic emboli.  We will start antibiotics.  Patient will need admission.  Final Clinical Impression(s) / ED Diagnoses Final diagnoses:  Hemorrhagic stroke [facility])    Rx / DC Orders ED Discharge Orders    None       Derwood Kaplan, MD 02/14/20 2132    Derwood Kaplan, MD 02/15/20 0001

## 2020-02-14 NOTE — ED Notes (Signed)
Neuorology paged code stroke per charge RN due to carelink not answering

## 2020-02-15 ENCOUNTER — Inpatient Hospital Stay (HOSPITAL_COMMUNITY): Payer: Medicaid Other

## 2020-02-15 DIAGNOSIS — B20 Human immunodeficiency virus [HIV] disease: Secondary | ICD-10-CM

## 2020-02-15 DIAGNOSIS — F17210 Nicotine dependence, cigarettes, uncomplicated: Secondary | ICD-10-CM

## 2020-02-15 DIAGNOSIS — R4701 Aphasia: Secondary | ICD-10-CM

## 2020-02-15 DIAGNOSIS — I6389 Other cerebral infarction: Secondary | ICD-10-CM

## 2020-02-15 DIAGNOSIS — F640 Transsexualism: Secondary | ICD-10-CM

## 2020-02-15 DIAGNOSIS — G8191 Hemiplegia, unspecified affecting right dominant side: Secondary | ICD-10-CM

## 2020-02-15 DIAGNOSIS — U071 COVID-19: Principal | ICD-10-CM

## 2020-02-15 DIAGNOSIS — I6322 Cerebral infarction due to unspecified occlusion or stenosis of basilar arteries: Secondary | ICD-10-CM

## 2020-02-15 DIAGNOSIS — J1282 Pneumonia due to coronavirus disease 2019: Secondary | ICD-10-CM

## 2020-02-15 LAB — COMPREHENSIVE METABOLIC PANEL
ALT: 12 U/L (ref 0–44)
AST: 24 U/L (ref 15–41)
Albumin: 2 g/dL — ABNORMAL LOW (ref 3.5–5.0)
Alkaline Phosphatase: 55 U/L (ref 38–126)
Anion gap: 11 (ref 5–15)
BUN: 7 mg/dL (ref 6–20)
CO2: 24 mmol/L (ref 22–32)
Calcium: 8.6 mg/dL — ABNORMAL LOW (ref 8.9–10.3)
Chloride: 97 mmol/L — ABNORMAL LOW (ref 98–111)
Creatinine, Ser: 0.88 mg/dL (ref 0.61–1.24)
GFR calc Af Amer: >60 mL/min (ref >60)
GFR calc non Af Amer: >60 mL/min (ref >60)
Glucose, Bld: 111 mg/dL — ABNORMAL HIGH (ref 70–99)
Potassium: 3.7 mmol/L (ref 3.5–5.1)
Sodium: 132 mmol/L — ABNORMAL LOW (ref 135–145)
Total Bilirubin: 0.7 mg/dL (ref 0.3–1.2)
Total Protein: 8.3 g/dL — ABNORMAL HIGH (ref 6.5–8.1)

## 2020-02-15 LAB — CSF CELL COUNT WITH DIFFERENTIAL
Lymphs, CSF: 52 % (ref 40–80)
Lymphs, CSF: 64 % (ref 40–80)
Monocyte-Macrophage-Spinal Fluid: 5 % — ABNORMAL LOW (ref 15–45)
Monocyte-Macrophage-Spinal Fluid: 6 % — ABNORMAL LOW (ref 15–45)
RBC Count, CSF: 15 /mm3 — ABNORMAL HIGH
RBC Count, CSF: 15 /mm3 — ABNORMAL HIGH
Segmented Neutrophils-CSF: 31 % — ABNORMAL HIGH (ref 0–6)
Segmented Neutrophils-CSF: 42 % — ABNORMAL HIGH (ref 0–6)
Tube #: 3
WBC, CSF: 39 /mm3 (ref 0–5)
WBC, CSF: 41 /mm3 (ref 0–5)

## 2020-02-15 LAB — LIPID PANEL
Cholesterol: 108 mg/dL (ref 0–200)
HDL: 18 mg/dL — ABNORMAL LOW (ref >40)
LDL Cholesterol: 71 mg/dL (ref 0–99)
Total CHOL/HDL Ratio: 6 RATIO
Triglycerides: 97 mg/dL (ref <150)
VLDL: 19 mg/dL (ref 0–40)

## 2020-02-15 LAB — HEMOGLOBIN A1C
Hgb A1c MFr Bld: 5.9 % — ABNORMAL HIGH (ref 4.8–5.6)
Mean Plasma Glucose: 122.63 mg/dL

## 2020-02-15 LAB — CRYPTOCOCCAL ANTIGEN, CSF: Crypto Ag: NEGATIVE

## 2020-02-15 LAB — PROTEIN AND GLUCOSE, CSF
Glucose, CSF: 42 mg/dL (ref 40–70)
Total  Protein, CSF: 72 mg/dL — ABNORMAL HIGH (ref 15–45)

## 2020-02-15 LAB — RPR
RPR Ser Ql: REACTIVE — AB
RPR Titer: 1:64 {titer}

## 2020-02-15 LAB — SARS CORONAVIRUS 2 (TAT 6-24 HRS): SARS Coronavirus 2: POSITIVE — AB

## 2020-02-15 LAB — ECHOCARDIOGRAM LIMITED
Height: 68 in
Weight: 2433.88 oz

## 2020-02-15 LAB — LACTIC ACID, PLASMA: Lactic Acid, Venous: 1 mmol/L (ref 0.5–1.9)

## 2020-02-15 MED ORDER — DEXAMETHASONE SODIUM PHOSPHATE 10 MG/ML IJ SOLN
6.0000 mg | INTRAMUSCULAR | Status: DC
  Administered 2020-02-15 – 2020-02-20 (×6): 6 mg via INTRAVENOUS
  Filled 2020-02-15 (×6): qty 1

## 2020-02-15 MED ORDER — ENOXAPARIN SODIUM 80 MG/0.8ML ~~LOC~~ SOLN
1.0000 mg/kg | Freq: Two times a day (BID) | SUBCUTANEOUS | Status: DC
  Administered 2020-02-15 – 2020-02-16 (×3): 70 mg via SUBCUTANEOUS
  Filled 2020-02-15 (×3): qty 0.8

## 2020-02-15 MED ORDER — DEXTROSE IN LACTATED RINGERS 5 % IV SOLN: INTRAVENOUS | Status: DC

## 2020-02-15 MED ORDER — SODIUM CHLORIDE 0.9 % IV SOLN
100.0000 mg | Freq: Every day | INTRAVENOUS | Status: AC
  Administered 2020-02-16 – 2020-02-19 (×4): 100 mg via INTRAVENOUS
  Filled 2020-02-15 (×4): qty 20

## 2020-02-15 MED ORDER — SODIUM CHLORIDE 0.9 % IV SOLN
200.0000 mg | Freq: Once | INTRAVENOUS | Status: AC
  Administered 2020-02-15: 200 mg via INTRAVENOUS
  Filled 2020-02-15: qty 40

## 2020-02-15 NOTE — Progress Notes (Signed)
Per rep (MRI), pt needs portable KUB prior to MRI d/t pt not being AOx4 (nmonverbal). [place] informed.

## 2020-02-15 NOTE — Evaluation (Signed)
Occupational Therapy Evaluation Patient Details Name: Tyler Vargas MRN: [id number] DOB: [birth year] Today's Date: 02/15/2020    History of Present Illness 34 y.o. adult with medical history significant for HIV/AIDS with CD4 count 84 and viral load 1.8 million earlier this month, latent syphilis, and cocaine abuse, now presenting to the emergency department with weakness. Pt aphasic and initially not following commands in ED, eventually able to follow commands but not provide history.  CT head is concerning for acute right MCA infarctions involving the frontal lobe, insula, and basal ganglia with small focus of hemorrhage in the right frontal operculum.  Also noted on CT is a new age-indeterminate lacunar infarction in the left internal capsule. Pt also found to be COVID +. Admitted 3/27 for treatment of acute CVA.   Clinical Impression   This 34 y/o patient presents with the above. Pt lethargic this session, arouses to external stimuli but only able to maintain eyes open for short periods of time. Pt will answer yes/no questions via blinking of eyes but with no attempts to verbalize responses. Pt with active motion noted in LUE and able to participate in simple face washing task via max hand over hand assist, pt otherwise currently totalA for ADL. Pt with some tone in RUE with ROM but overall with no active movements noted. Mobility beyond bed level deferred given pt decreased level of arousal. Pt will benefit from continued acute OT services and given current deficits recommend SNF level therapies at time of discharge. Will follow.     Follow Up Recommendations  SNF    Equipment Recommendations  Other (comment)(TBD)           Precautions / Restrictions Precautions Precautions: Fall Restrictions Weight Bearing Restrictions: No      Mobility Bed Mobility               General bed mobility comments: given pt low level of arousal and lack of strength deferred attempt at bed mobility  until more participatory          ADL either performed or assessed with clinical judgement   ADL Overall ADL's : Needs assistance/impaired     Grooming: Wash/dry face;Maximal assistance;Bed level Grooming Details (indicate cue type and reason): assist to support L elbow with hand over hand assist provided to initiate task, though given cues pt initiates attempts to carry over on his own                               General ADL Comments: overall totalA for self care                          Pertinent Vitals/Pain Pain Assessment: Faces Faces Pain Scale: No hurt Pain Intervention(s): Monitored during session     Hand Dominance     Extremity/Trunk Assessment Upper Extremity Assessment Upper Extremity Assessment: RUE deficits/detail;Difficult to assess due to impaired cognition;LUE deficits/detail;Generalized weakness RUE Deficits / Details: pt with gross weakness but able to activate movements given distal support, will further assess as pt more awake/alert and able to follwo commands  RUE Coordination: decreased fine motor;decreased gross motor LUE Deficits / Details: increased tone noted with PROM, weak grasp but otherwise no AROM noted at this time, will continue to assess  LUE Sensation: (will need further assessment) LUE Coordination: decreased fine motor;decreased gross motor   Lower Extremity Assessment Lower Extremity Assessment: Defer to PT evaluation RLE  Deficits / Details: PROM WFL, strength 0/5, some tone in hip and knee, difficult to determine but probable lack of sensation  LLE Deficits / Details: AAROM, strength grossly 2/5  LLE Sensation: decreased light touch LLE Coordination: decreased gross motor;decreased fine motor       Communication Communication Communication: Other (comment)(able to communicate yes/no with blinking eyes)   Cognition Arousal/Alertness: Lethargic Behavior During Therapy: Flat affect Overall Cognitive Status:  Difficult to assess                                 General Comments: asleep on entry, able to rouse for short duration before return to sleep, able to blink eyes for communication, aware she is in the hospital    General Comments  VSS on RA    Exercises     Shoulder Instructions      Home Living Family/patient expects to be discharged to:: Unsure                                 Additional Comments: pt unable to speak today       Prior Functioning/Environment          Comments: unknown        OT Problem List: Decreased strength;Decreased range of motion;Decreased activity tolerance;Impaired balance (sitting and/or standing);Impaired vision/perception;Decreased cognition;Decreased safety awareness;Decreased knowledge of use of DME or AE;Impaired UE functional use;Impaired sensation;Decreased coordination;Decreased knowledge of precautions;Impaired tone      OT Treatment/Interventions: Self-care/ADL training;Therapeutic exercise;Energy conservation;DME and/or AE instruction;Therapeutic activities;Cognitive remediation/compensation;Visual/perceptual remediation/compensation;Patient/family education;Balance training;Neuromuscular education;Splinting    OT Goals(Current goals can be found in the care plan section) Acute Rehab OT Goals Patient Stated Goal: none stated OT Goal Formulation: Patient unable to participate in goal setting Time For Goal Achievement: 2020-03-09 Potential to Achieve Goals: Good  OT Frequency: Min 2X/week   Barriers to D/C:            Co-evaluation PT/OT/SLP Co-Evaluation/Treatment: Yes Reason for Co-Treatment: Complexity of the patient's impairments (multi-system involvement);For patient/therapist safety PT goals addressed during session: Strengthening/ROM OT goals addressed during session: Strengthening/ROM;ADL's and self-care      AM-PAC OT "6 Clicks" Daily Activity     Outcome Measure Help from another person  eating meals?: Total Help from another person taking care of personal grooming?: Total Help from another person toileting, which includes using toliet, bedpan, or urinal?: Total Help from another person bathing (including washing, rinsing, drying)?: Total Help from another person to put on and taking off regular upper body clothing?: Total Help from another person to put on and taking off regular lower body clothing?: Total 6 Click Score: 6   End of Session Nurse Communication: Mobility status  Activity Tolerance: Patient limited by lethargy Patient left: in bed;with call bell/phone within reach;with bed alarm set  OT Visit Diagnosis: Muscle weakness (generalized) (M62.81);Hemiplegia and hemiparesis;Cognitive communication deficit (W73.710)                Time: 6269-4854 OT Time Calculation (min): 19 min Charges:  OT General Charges $OT Visit: 1 Visit OT Evaluation $OT Eval High Complexity: 1 High  Lou Cal, OT Acute Rehabilitation Services Pager [phone] Office [phone]   Raymondo Band 02/15/2020, 1:53 PM

## 2020-02-15 NOTE — Procedures (Signed)
Patient Name: Tamer Baughman  MRN: [id number]  Epilepsy Attending: Charlsie Quest  Referring Physician/Provider: Dr Georgiana Spinner Aroor Date: 02/15/2020 Duration: 23.26 mins  Patient history: 33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 EEG to evaluate for seizure  Level of alertness: asleep   AEDs during EEG study: None  Technical aspects: This EEG study was done with scalp electrodes positioned according to the 10-20 International system of electrode placement. Electrical activity was acquired at a sampling rate of 500Hz  and reviewed with a high frequency filter of 70Hz  and a low frequency filter of 1Hz . EEG data were recorded continuously and digitally stored.   DESCRIPTION:  Sleep was characterized by vertex waves, sleep spindles, maximal frontocentral. Hyperventilation and photic stimulation were not performed.  IMPRESSION: This study during sleep only is within normal limits. No seizures or epileptiform discharges were seen throughout the recording.  If suspicion for interictal activity remains a concern, a prolonged study can be considered.   Hermes Wafer 

## 2020-02-15 NOTE — Consult Note (Signed)
Date of Admission:  02/14/2020          Reason for Consult:  Multiple acute infarcts involving right MCA territory basal ganglia right frontal and parietal operculum and left internal capsule and patient with HIV AIDS, Syphilis and now COVID-19 infection  Referring Provider: Dr. Myna Vargas   Assessment:  1. Multiple acute infarcts which could be due to either 2. COVID 19 infection 3. Neurosyphilis 4. Less likely endocarditis 5. HIV/AIDS with poor adherence 6. Transgender identity  Plan:  1. Continue IV penicillin 2. OK with vancomycin but if blood cultures are no growth would get rid of it 3. Followup CSF studies 4. Supportive care 5. Ok to continue BIKTARVY 6. Would repeat CT of head 7. Remdesivir and she now appears to be on steroids     Principal Problem:   Acute CVA (cerebrovascular accident) [facility]) Active Problems:   AIDS (acquired immune deficiency syndrome) ([name])   History of syphilis   Scheduled Meds: .  stroke: mapping our early stages of recovery book   Does not apply Once  . bictegravir-emtricitabine-tenofovir AF  1 tablet Oral Daily  . dexamethasone (DECADRON) injection  6 mg Intravenous Q24H  . sulfamethoxazole-trimethoprim  1 tablet Oral Daily   Continuous Infusions: . dextrose 5% lactated ringers 75 mL/hr at 02/15/20 1114  . pencillin G potassium IV 4 Million Units (02/15/20 1123)  . [START ON 02/16/2020] remdesivir 100 mg in NS 100 mL     PRN Meds:.acetaminophen **OR** acetaminophen (TYLENOL) oral liquid 160 mg/5 mL **OR** acetaminophen, senna-docusate  HPI: Tyler Vargas is a 34 y.o. adult African American transgender male to male living with HIV and AIDS who was diagnosed in 2017 wjhile she was incarcerated She came into care with Tyler Vargas in November 2020 having had a history of esophageal candidiasis.  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  When she presented for care in November with Tyler Vargas she had had a baseline viral load of 1.4 million copies.  She started on Biktarvy and successfully suppressed her virus down to 880 copies by December 2020.  Unfortunately she apparently came off meds and rebounded to a viral load of 1.8 million on March 10.  Most recent for count was 81.  RPR had gone down two fold dilution when checked.  He apparently has been having worsening weakness 2 days prior to coming to the ER and had called out from work.  In the ER she was found to be aphasic and was not following commands initially.  CT of the head showed acute right MCA infarctions involving frontal lobe insula basal ganglia with a focus of hemorrhage in the right frontal operculum.  She underwent lumbar puncture which showed 41 white cells with lymphocytic predominance of 64% lymphs, glucose of 42 and protein of 72.  CSF Gram stain was negative.  CSF cryptococcal antigen negative.  CSF VDRL is pending.  Blood cultures have been taken and she was started on antibiotics including vancomycin penicillin  Her COVID-19 test is now positive, and she has been started on remdesivir  When I examined her late this morning she was not moving the right side of her body at all but could follow commands and grab my hand with her left hand she was aphasic.  I will order a repeat CT of the head without contrast given change in neurological status based on my exam versus when  she was admitted  My suspicion is that these multiple infarcts are more likely related to COVID-19 infection but I certainly think we should treat for neurosyphilis given her history of syphilis in the past.  She does also have CSF pleocytosis which is typically enough to support a diagnosis of Neurosyphilis even if VDRL is negative. I am curious how RPR repeat in serum looks  Would coninue IV penicillin  She has suffered with significant depression.  I am OK to resumer  her BIKTARVY given no clear OI in CNS  Dr. Orvan Vargas will see tomorrow.   Review of Systems: Review of Systems  Unable to perform ROS: Patient unresponsive    Past Medical History:  Diagnosis Date  . History of syphilis 09/30/2019  . HIV (human immunodeficiency virus infection) (H[facility])   . Male-to-male transgender person     Social History   Tobacco Use  . Smoking status: Current Every Day Smoker    Packs/day: 0.25    Types: Cigarettes  . Smokeless tobacco: Never Used  . Tobacco comment: cutting back  Substance Use Topics  . Alcohol use: Yes    Comment: every 2-3 days; beer, vodka  . Drug use: Yes    Types: Marijuana, Cocaine    Comment: cocaine past weekend (2 times a week), Marijuana 3-4 times a week    Family History  Problem Relation Age of Onset  . Cancer Neg Hx   . Diabetes Neg Hx    No Known Allergies  OBJECTIVE: Blood pressure (!) 130/96, pulse 80, temperature 99.8 F (37.7 C), temperature source Oral, resp. rate 20, height 5\' 8"  (1.727 m), weight 69 kg, SpO2 100 %.  Physical Exam HENT:     Head: Normocephalic.     Nose: Nose normal.  Eyes:     Extraocular Movements: Extraocular movements intact.  Cardiovascular:     Rate and Rhythm: Normal rate and regular rhythm.  Pulmonary:     Effort: Pulmonary effort is normal. No respiratory distress.     Breath sounds: Normal breath sounds.  Abdominal:     General: Abdomen is flat. Bowel sounds are normal. There is no distension.  Musculoskeletal:        General: Normal range of motion.  Skin:    General: Skin is warm and dry.  Neurological:     Mental Status: She is alert.     Cranial Nerves: Facial asymmetry present.     Motor: Weakness present.    She was able to grab my hand with her left arm but could not move her right arm at all voluntarily.  Lab Results Lab Results  Component Value Date   WBC 11.4 (H) 02/14/2020   HGB 11.2 (L) 02/14/2020   HCT 33.0 (L) 02/14/2020   MCV 95.4 02/14/2020    PLT 561 (H) 02/14/2020    Lab Results  Component Value Date   CREATININE 0.88 02/15/2020   BUN 7 02/15/2020   NA 132 (L) 02/15/2020   K 3.7 02/15/2020   CL 97 (L) 02/15/2020   CO2 24 02/15/2020    Lab Results  Component Value Date   ALT 12 02/15/2020   AST 24 02/15/2020   ALKPHOS 55 02/15/2020   BILITOT 0.7 02/15/2020     Microbiology: Recent Results (from the past 240 hour(s))  Culture, blood (Routine X 2) w Reflex to ID Panel     Status: None (Preliminary result)   Collection Time: 02/14/20  9:40 PM   Specimen: BLOOD  Result Value  Ref Range Status   Specimen Description BLOOD RIGHT ARM  Final   Special Requests   Final    BOTTLES DRAWN AEROBIC AND ANAEROBIC Blood Culture adequate volume   Culture   Final    NO GROWTH < 12 HOURS Performed at [facility], [address], [place], [place] [address]    Report Status PENDING  Incomplete  Culture, blood (Routine X 2) w Reflex to ID Panel     Status: None (Preliminary result)   Collection Time: 02/14/20  9:46 PM   Specimen: BLOOD  Result Value Ref Range Status   Specimen Description BLOOD RIGHT FOREARM  Final   Special Requests   Final    BOTTLES DRAWN AEROBIC AND ANAEROBIC Blood Culture adequate volume   Culture   Final    NO GROWTH < 12 HOURS Performed at [facility], [address], [place], [place] [address]    Report Status PENDING  Incomplete  SARS CORONAVIRUS 2 (TAT 6-24 HRS)     Status: Abnormal   Collection Time: 02/14/20 10:55 PM  Result Value Ref Range Status   SARS Coronavirus 2 POSITIVE (A) NEGATIVE Final    Comment: RESULT CALLED TO, READ BACK BY AND VERIFIED WITH: K. GUY,RN [id number] 02/15/2020 T. TYSOR (NOTE) SARS-CoV-2 target nucleic acids are DETECTED. The SARS-CoV-2 RNA is generally detectable in upper and lower respiratory specimens during the acute phase of infection. Positive results are indicative of the presence of SARS-CoV-2 RNA. Clinical correlation with patient history and other  diagnostic information is  necessary to determine patient infection status. Positive results do not rule out bacterial infection or co-infection with other viruses.  The expected result is Negative. Fact Sheet for Patients: HairSlick.no Fact Sheet for Healthcare Providers: quierodirigir.com This test is not yet approved or cleared by the [place] FDA and  has been authorized for detection and/or diagnosis of SARS-CoV-2 by FDA under an Emergency Use Authorization (EUA). This EUA will remain  in effect (meaning this test can be used) for the  duration of the COVID-19 declaration under Section 564(b)(1) of the Act, 21 U.S.C. section 360bbb-3(b)(1), unless the authorization is terminated or revoked sooner. Performed at [facility], [address] [place], [place] [address]   CSF culture with Stat gram stain     Status: None (Preliminary result)   Collection Time: 02/14/20 11:35 PM   Specimen: CSF; Cerebrospinal Fluid  Result Value Ref Range Status   Specimen Description CSF  Final   Special Requests NONE  Final   Gram Stain   Final    CYTOSPIN SMEAR WBC PRESENT,BOTH PMN AND MONONUCLEAR NO ORGANISMS SEEN Performed at [facility], [address] [place], [place] [address]    Culture PENDING  Incomplete   Report Status PENDING  Incomplete    [name], MD [facility] [phone] pager  02/15/2020, 12:34 PM

## 2020-02-15 NOTE — Progress Notes (Signed)
Pt positive Covid 19, [place] informed.

## 2020-02-15 NOTE — Evaluation (Signed)
Physical Therapy Evaluation Patient Details Name: Tyler Vargas MRN: [id number] DOB: 1986/10/08 Today's Date: 02/15/2020   History of Present Illness  34 y.o. adult with medical history significant for HIV/AIDS with CD4 count 84 and viral load 1.8 million earlier this month, latent syphilis, and cocaine abuse, now presenting to the emergency department with weakness. Tyler Vargas aphasic and initially not following commands in ED, eventually able to follow commands but not provide history.  CT head is concerning for acute right MCA infarctions involving the frontal lobe, insula, and basal ganglia with small focus of hemorrhage in the right frontal operculum.  Also noted on CT is a new age-indeterminate lacunar infarction in the left internal capsule. Tyler Vargas also found to be COVID +. Admitted 3/27 for treatment of acute CVA.  Clinical Impression  Tyler Vargas/OT co-evaluation due to Tyler Vargas's lack of command follow per notes. Tyler Vargas asleep on entry but able to wake to gentle nudging and calling her name. Tyler Vargas opens eyes through questioning is able to respond to questions correctly by blinking. Answers to her name and is aware she is in the hospital. Tyler Vargas able to participate in UE examination however after bout of coughing and assist with washing her face, falls asleep and is only minimally responsive with LE examination. Mobility deferred today due to level of arousal and lack of strength R>L on evaluation. Tyler Vargas recommending SNF level rehab at this point. Tyler Vargas will follow back for more thorough evaluation when Tyler Vargas is more awake.     Follow Up Recommendations SNF    Equipment Recommendations  Other (comment)(TBD at next venue)       Precautions / Restrictions Precautions Precautions: Fall Restrictions Weight Bearing Restrictions: No      Mobility  Bed Mobility               General bed mobility comments: given Tyler Vargas low level of arousal and lack of strength deferred attempt at bed mobility until more participatory                Pertinent Vitals/Pain Pain Assessment: Faces Faces Pain Scale: No hurt    Home Living Family/patient expects to be discharged to:: Unsure                 Additional Comments: Tyler Vargas unable to speak today     Prior Function           Comments: unknown     Hand Dominance        Extremity/Trunk Assessment   Upper Extremity Assessment Upper Extremity Assessment: Defer to OT evaluation    Lower Extremity Assessment Lower Extremity Assessment: RLE deficits/detail;LLE deficits/detail;Difficult to assess due to impaired cognition RLE Deficits / Details: PROM WFL, strength 0/5, some tone in hip and knee, difficult to determine but probable lack of sensation  LLE Deficits / Details: AAROM, strength grossly 2/5  LLE Sensation: decreased light touch LLE Coordination: decreased gross motor;decreased fine motor       Communication   Communication: Other (comment)(able to communicate yes/no with blinking eyes)  Cognition Arousal/Alertness: Lethargic Behavior During Therapy: Flat affect Overall Cognitive Status: Difficult to assess                                 General Comments: asleep on entry, able to rouse for short duration before return to sleep, able to blink eyes for communication, aware she is in the hospital       General Comments  General comments (skin integrity, edema, etc.): VSS on RA        Assessment/Plan    Tyler Vargas Assessment Patient needs continued Tyler Vargas services  Tyler Vargas Problem List Decreased strength;Decreased activity tolerance;Decreased mobility;Decreased balance;Decreased coordination;Decreased cognition;Decreased safety awareness;Impaired tone;Impaired sensation       Tyler Vargas Treatment Interventions DME instruction;Gait training;Functional mobility training;Therapeutic activities;Therapeutic exercise;Balance training;Neuromuscular re-education;Cognitive remediation;Patient/family education    Tyler Vargas Goals (Current goals can be found in the  Care Plan section)  Acute Rehab Tyler Vargas Goals Patient Stated Goal: none stated Tyler Vargas Goal Formulation: Patient unable to participate in goal setting Time For Goal Achievement: 03-15-20 Potential to Achieve Goals: Fair    Frequency Min 3X/week        Co-evaluation Tyler Vargas/OT/SLP Co-Evaluation/Treatment: Yes Reason for Co-Treatment: Complexity of the patient's impairments (multi-system involvement);For patient/therapist safety Tyler Vargas goals addressed during session: Strengthening/ROM         AM-PAC Tyler Vargas "6 Clicks" Mobility  Outcome Measure Help needed turning from your back to your side while in a flat bed without using bedrails?: Total Help needed moving from lying on your back to sitting on the side of a flat bed without using bedrails?: Total Help needed moving to and from a bed to a chair (including a wheelchair)?: Total Help needed standing up from a chair using your arms (e.g., wheelchair or bedside chair)?: Total Help needed to walk in hospital room?: Total Help needed climbing 3-5 steps with a railing? : Total 6 Click Score: 6    End of Session   Activity Tolerance: Patient limited by lethargy Patient left: in bed;with call bell/phone within reach;with bed alarm set Nurse Communication: Other (comment)(decreased ability to participate due to lethargy) Tyler Vargas Visit Diagnosis: Unsteadiness on feet (R26.81);Other abnormalities of gait and mobility (R26.89);Muscle weakness (generalized) (M62.81);Difficulty in walking, not elsewhere classified (R26.2);Hemiplegia and hemiparesis Hemiplegia - Right/Left: Right    Time: 5916-3846 Tyler Vargas Time Calculation (min) (ACUTE ONLY): 19 min   Charges:   Tyler Vargas Evaluation $Tyler Vargas Eval Moderate Complexity: 1 Mod          Aysen Shieh B. Migdalia Dk Tyler Vargas, DPT Acute Rehabilitation Services Pager [phone] Office [phone]   [place] 02/15/2020, 1:02 PM

## 2020-02-15 NOTE — Progress Notes (Signed)
EEG complete - results pending 

## 2020-02-15 NOTE — Progress Notes (Signed)
Patient received from the 3 [facility]. Patient is disoriented x4. Vital signs are stable. Iv in place and running fluid. Skin assessment done with another nurse. No pain and discomfort noted. Bed in low position and side rail up x2. Call bell and phone in reach.

## 2020-02-15 NOTE — Progress Notes (Signed)
PROGRESS NOTE    Tyler Vargas  NLZ:[id number] DOB: 1986/10/04 DOA: 02/14/2020 PCP: Patient, No Pcp Per    Brief Narrative:  Patient was admitted to the hospital with a working diagnosis of acute right MCA infarct, ischemic/hemorrhagic, in the setting of acute SARS COVID-19 viral pneumonia.    34 year old male who presented with weakness.  He does have significant past medical history of HIV/AIDS, CD4 count 84, viral load 1.8, latent syphilis, and cocaine abuse.  EMS was called due to generalized weakness for about 2 days.  In the emergency department he cannot give any history due to altered mental status.  His blood pressure was 142/115, heart rate 90, respiratory 22, oxygen saturation 100%, he was aphasic, strength 4 out of 5 on the right side, not fully following commands, his lungs are clear to auscultation bilaterally, heart S1-S2 present rhythmic, soft abdomen, no lower extremity edema. Sodium 132, potassium 3.7, chloride 97, bicarb 23, glucose 114, BUN 9, creatinine 0.86, white count 11.4, hemoglobin 10.6, hematocrit 35.0, platelets 561.  SARS COVID-19 was positive, urinalysis specific gravity more than 1.046, 0-5 white cells, lumbar puncture, glucose 42, white cells 39, lymphocytes 52, protein 72, drug screen positive for cocaine.  Head CT with acute right MCA infarct involving the frontal lobe, insula and basal ganglia with small focus of hemorrhage in the right frontal operculum.  Head and neck angiography with multiple missing right MCA branch vessels in the superior division.  His chest x-ray had bilateral interstitial infiltrates at bases.  Further work-up with brain MRI confirmed acute infarct right MCA territory involving the basal ganglia as well as right frontal and parietal operculum, positive small areas of associated hemorrhage.  Assessment & Plan:   Principal Problem:   Acute CVA (cerebrovascular accident) (HCC) Active Problems:   AIDS (acquired immune deficiency syndrome)  (HCC)   History of syphilis   1. Acute right MCA infarct. Continue neuro checks, follow with EEG report and further recommendations from neurology. Possible increase thrombosis due to covid 19 viral infection. Continue with aspiration precautions and fall precautions. Clinical signs of bacterial meningitis.   Follow with speech therapy, occupational/ physical therapy and neurology recommendations. Continue hydration with dextrose and LR, while npo.   Continue anticoagulation with enoxaparin.   2. SARS COVID 19 viral pneumonia.   RR: 20  Pulse oxymetry: 96%  Fi02: 21% room air.   Chest film with positive infiltrates, patient will be placed on remdesivir and systemic steroids, will follow up on inflammatory markers. Patient with immunosuppression at high risk for worsening viral pneumonia.   3. HIV/AIDS/ latent syphilis. Continue with antiretroviral regimen and penicillin. Will follow with ID recommendations. Continue PCP prophylaxis.   On vancomycin per ID recommendations.   DVT prophylaxis: enoxaparin   Code Status:  full Family Communication: no family at the bedside Disposition Plan/ discharge barriers: patient from home barrier for dc critically ill,    Consultants:   ID   Neurology   Procedures:   LP   Antimicrobials:   Bactrim     Subjective: Patient opens eyes and follows commands but is not verbal. Not in apparent distress.   Objective: Vitals:   02/15/20 0153 02/15/20 0157 02/15/20 0450 02/15/20 0732  BP: (!) 144/95  134/89 (!) 130/96  Pulse: 76  85 80  Resp: 16  19 20   Temp: 98.8 F (37.1 C)  99 F (37.2 C) 99.8 F (37.7 C)  TempSrc: Oral  Oral Oral  SpO2: 100%  96% 100%  Weight:  66.9 kg 69 kg   Height:   5\' 8"  (1.727 m)     Intake/Output Summary (Last 24 hours) at 02/15/2020 0838 Last data filed at 02/15/2020 0544 Gross per 24 hour  Intake 233.5 ml  Output 550 ml  Net -316.5 ml   Filed Weights   02/14/20 2205 02/15/20 0157 02/15/20 0450   Weight: 65.9 kg 66.9 kg 69 kg    Examination:   General: Not in pain or dyspnea, deconditioned  Neurology: opens eyes to voice and touch, follows commands with his left upper and lower extremity, paresis of right upper and decreased strength on the right foot.  E ENT: mild pallor, no icterus, oral mucosa moist Cardiovascular: No JVD. S1-S2 present, rhythmic, no gallops, rubs, or murmurs. No lower extremity edema. Pulmonary: positive breath sounds bilaterally, adequate air movement, no wheezing, rhonchi or rales. Gastrointestinal. Abdomen with no organomegaly, non tender, no rebound or guarding Skin. No rashes Musculoskeletal: no joint deformities     Data Reviewed: I have personally reviewed following labs and imaging studies  CBC: Recent Labs  Lab 02/14/20 1905 02/14/20 1911  WBC 11.4*  --   NEUTROABS 9.9*  --   HGB 10.6* 11.2*  HCT 35.0* 33.0*  MCV 95.4  --   PLT 561*  --    Basic Metabolic Panel: Recent Labs  Lab 02/14/20 1905 02/14/20 1911 02/15/20 0052  NA 132* 133* 132*  K 3.7 3.7 3.7  CL 97* 97* 97*  CO2 23  --  24  GLUCOSE 114* 115* 111*  BUN 9 9 7   CREATININE 0.86 0.80 0.88  CALCIUM 8.9  --  8.6*   GFR: Estimated Creatinine Clearance (by C-G formula based on SCr of 0.88 mg/dL) Male: 91.7 mL/min Male: 115.5 mL/min Liver Function Tests: Recent Labs  Lab 02/14/20 1905 02/15/20 0052  AST 25 24  ALT 13 12  ALKPHOS 56 55  BILITOT 0.5 0.7  PROT 9.4* 8.3*  ALBUMIN 2.3* 2.0*   No results for input(s): LIPASE, AMYLASE in the last 168 hours. No results for input(s): AMMONIA in the last 168 hours. Coagulation Profile: Recent Labs  Lab 02/14/20 1905  INR 1.1   Cardiac Enzymes: No results for input(s): CKTOTAL, CKMB, CKMBINDEX, TROPONINI in the last 168 hours. BNP (last 3 results) No results for input(s): PROBNP in the last 8760 hours. HbA1C: Recent Labs    02/15/20 0052  HGBA1C 5.9*   CBG: No results for input(s): GLUCAP in the last 168  hours. Lipid Profile: Recent Labs    02/15/20 0052  CHOL 108  HDL 18*  LDLCALC 71  TRIG 97  CHOLHDL 6.0   Thyroid Function Tests: No results for input(s): TSH, T4TOTAL, FREET4, T3FREE, THYROIDAB in the last 72 hours. Anemia Panel: No results for input(s): VITAMINB12, FOLATE, FERRITIN, TIBC, IRON, RETICCTPCT in the last 72 hours.    Radiology Studies: I have reviewed all of the imaging during this hospital visit personally     Scheduled Meds: .  stroke: mapping our early stages of recovery book   Does not apply Once  . bictegravir-emtricitabine-tenofovir AF  1 tablet Oral Daily  . sulfamethoxazole-trimethoprim  1 tablet Oral Daily   Continuous Infusions: . sodium chloride 100 mL/hr at 02/15/20 0503  . pencillin G potassium IV 4 Million Units (02/15/20 0740)  . vancomycin       LOS: 1 day        Kamoni Gentles Gerome Apley, MD

## 2020-02-15 NOTE — Progress Notes (Signed)
  Echocardiogram 2D Echocardiogram limited has been performed.  Leta Jungling M 02/15/2020, 10:21 AM

## 2020-02-15 NOTE — Progress Notes (Signed)
Pt not good historian, therefore admission doc not complete.

## 2020-02-15 NOTE — Progress Notes (Signed)
      INFECTIOUS DISEASE ATTENDING ADDENDUM:   Date: 02/15/2020  Patient name: Tyler Vargas  Medical record number: [id number]  Date of birth: 1986-09-17   Blood cultures no growth so far  ECHO without vegetations  RPR has gone down by FOUR fold dilution  I DO NOT think patient has endocarditis  RPR going down after 3 doses of IM PCN not c/w syphilis  I think her CVA are due to COVID 19  I will stop PCN  Dr. Orvan Falconer to see tomorrow.   [name] 02/15/2020, 4:54 PM

## 2020-02-15 NOTE — Progress Notes (Signed)
Bodenhiemer informed of pt's spinal fluid WBC 39

## 2020-02-15 NOTE — Progress Notes (Signed)
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   CSF neutrophilic pleocytosis- syphilis + in the past - not sure if treated - on PCN now  MRI Brain shows acute infarcts in bilateral BG, right frontal operculum and insula.    ECG and tele - NSR, TTE pending  LDL 71, TG 97, A1c 5.9.    EEG pending    OBJECTIVE Vitals:   02/15/20 0153 02/15/20 0157 02/15/20 0450 02/15/20 0732  BP: (!) 144/95  134/89 (!) 130/96  Pulse: 76  85 80  Resp: 16  19 20   Temp: 98.8 F (37.1 C)  99 F (37.2 C) 99.8 F (37.7 C)  TempSrc: Oral  Oral Oral  SpO2: 100%  96% 100%  Weight:  66.9 kg 69 kg   Height:   5\' 8"  (1.727 m)     CBC:  Recent Labs  Lab 02/14/20 1905 02/14/20 1911  WBC 11.4*  --   NEUTROABS 9.9*  --   HGB 10.6* 11.2*  HCT 35.0* 33.0*  MCV 95.4  --   PLT 561*  --     Basic Metabolic Panel:  Recent Labs  Lab 02/14/20 1905 02/14/20 1905 02/14/20 1911 02/15/20 0052  NA 132*   < > 133* 132*  K 3.7   < > 3.7 3.7  CL 97*   < > 97* 97*  CO2 23  --   --  24  GLUCOSE 114*   < > 115* 111*  BUN 9   < > 9 7  CREATININE 0.86   < > 0.80 0.88  CALCIUM 8.9  --   --  8.6*   < > = values in this interval not displayed.    Lipid Panel:     Component Value Date/Time   CHOL 108 02/15/2020 0052   TRIG 97 02/15/2020 0052   HDL 18 (L) 02/15/2020 0052   CHOLHDL 6.0 02/15/2020 0052   VLDL 19 02/15/2020 0052   LDLCALC 71 02/15/2020 0052   LDLCALC 55 09/15/2019 1446   HgbA1c:  Lab Results  Component Value Date   HGBA1C 5.9 (H) 02/15/2020   Urine Drug Screen:     Component Value Date/Time   LABOPIA NONE DETECTED 02/14/2020 2146   COCAINSCRNUR POSITIVE (A) 02/14/2020 2146   LABBENZ NONE DETECTED 02/14/2020 2146   AMPHETMU NONE DETECTED 02/14/2020 2146   THCU NONE DETECTED 02/14/2020 2146   LABBARB NONE DETECTED 02/14/2020 2146    Alcohol Level     Component Value Date/Time   ETH 167 (H) 03/07/2014  0654    IMAGING   CT ANGIO HEAD W OR WO CONTRAST  Result Date: 02/14/2020 CLINICAL DATA:  Stroke. Right-sided weakness and speech disturbance. EXAM: CT ANGIOGRAPHY HEAD AND NECK CT PERFUSION BRAIN TECHNIQUE: Multidetector CT imaging of the head and neck was performed using the standard protocol during bolus administration of intravenous contrast. Multiplanar CT image reconstructions and MIPs were obtained to evaluate the vascular anatomy. Carotid stenosis measurements (when applicable) are obtained utilizing NASCET criteria, using the distal internal carotid diameter as the denominator. Multiphase CT imaging of the brain was performed following IV bolus contrast injection. Subsequent parametric perfusion maps were calculated using RAPID software. CONTRAST:  64mL OMNIPAQUE IOHEXOL 350 MG/ML SOLN; 67mL OMNIPAQUE IOHEXOL 350 MG/ML SOLN COMPARISON:  None. FINDINGS: CTA NECK FINDINGS Aortic arch: Standard 3 vessel aortic arch with widely patent arch vessel origins. Right carotid system: Patent and smooth without evidence of  stenosis or dissection. Left carotid system: Patent and smooth without evidence of stenosis or dissection. Vertebral arteries: Patent, smooth, and codominant without evidence of stenosis or dissection. Skeleton: Poor dentition with multiple periapical erosions. Other neck: Borderline enlarged level II lymph nodes bilaterally. Upper chest: Centrilobular emphysema. Small superior mediastinal lymph nodes. Review of the MIP images confirms the above findings CTA HEAD FINDINGS Anterior circulation: The internal carotid arteries are widely patent from skull base to carotid termini. The A1 and M1 segments are widely patent with the left A1 segment being mildly hypoplastic. There is a decreased number of branch vessels in the right MCA superior division with suggestion of occlusion and intermittent reconstitution of distal M2 and M3 branches. There is also multifocal irregular narrowing of M3 branches  in the right MCA inferior division, and there is mild left MCA branch vessel irregularity. No aneurysm is identified. Posterior circulation: The intracranial vertebral arteries are widely patent to the basilar. The basilar artery is widely patent. There are small posterior communicating arteries bilaterally. PCAs are patent with mild branch vessel irregularity more notable on the right without evidence of flow limiting proximal stenosis. No aneurysm is identified. Venous sinuses: Patent. Anatomic variants: None. Review of the MIP images confirms the above findings CT Brain Perfusion Findings: ASPECTS: 4 CBF (<30%) Volume: 0mL Perfusion (Tmax>6.0s) volume: 12mL Mismatch Volume: 12mL, however the core infarct on noncontrast head CT is not being picked up by the automated processing and the true penumbra is less than 12 mL Infarction Location: Right MCA IMPRESSION: 1. Multiple missing right MCA branch vessels in the superior division with wide patency of the M1 segment and no discrete proximal M2 occlusion identified. 2. Widely patent common carotid, internal carotid, and vertebral arteries. 3. CT perfusion demonstrating at most a small penumbra as detailed above. These results were called by telephone at the time of interpretation on 02/14/2020 at 7:55 p.m. to Dr. Laurence Slate, who verbally acknowledged these results. Electronically Signed   By: Sebastian Ache M.D.   On: 02/14/2020 20:22   CT ANGIO NECK W OR WO CONTRAST  Result Date: 02/14/2020 CLINICAL DATA:  Stroke. Right-sided weakness and speech disturbance. EXAM: CT ANGIOGRAPHY HEAD AND NECK CT PERFUSION BRAIN TECHNIQUE: Multidetector CT imaging of the head and neck was performed using the standard protocol during bolus administration of intravenous contrast. Multiplanar CT image reconstructions and MIPs were obtained to evaluate the vascular anatomy. Carotid stenosis measurements (when applicable) are obtained utilizing NASCET criteria, using the distal internal  carotid diameter as the denominator. Multiphase CT imaging of the brain was performed following IV bolus contrast injection. Subsequent parametric perfusion maps were calculated using RAPID software. CONTRAST:  40mL OMNIPAQUE IOHEXOL 350 MG/ML SOLN; 75mL OMNIPAQUE IOHEXOL 350 MG/ML SOLN COMPARISON:  None. FINDINGS: CTA NECK FINDINGS Aortic arch: Standard 3 vessel aortic arch with widely patent arch vessel origins. Right carotid system: Patent and smooth without evidence of stenosis or dissection. Left carotid system: Patent and smooth without evidence of stenosis or dissection. Vertebral arteries: Patent, smooth, and codominant without evidence of stenosis or dissection. Skeleton: Poor dentition with multiple periapical erosions. Other neck: Borderline enlarged level II lymph nodes bilaterally. Upper chest: Centrilobular emphysema. Small superior mediastinal lymph nodes. Review of the MIP images confirms the above findings CTA HEAD FINDINGS Anterior circulation: The internal carotid arteries are widely patent from skull base to carotid termini. The A1 and M1 segments are widely patent with the left A1 segment being mildly hypoplastic. There is a decreased number of branch  vessels in the right MCA superior division with suggestion of occlusion and intermittent reconstitution of distal M2 and M3 branches. There is also multifocal irregular narrowing of M3 branches in the right MCA inferior division, and there is mild left MCA branch vessel irregularity. No aneurysm is identified. Posterior circulation: The intracranial vertebral arteries are widely patent to the basilar. The basilar artery is widely patent. There are small posterior communicating arteries bilaterally. PCAs are patent with mild branch vessel irregularity more notable on the right without evidence of flow limiting proximal stenosis. No aneurysm is identified. Venous sinuses: Patent. Anatomic variants: None. Review of the MIP images confirms the above  findings CT Brain Perfusion Findings: ASPECTS: 4 CBF (<30%) Volume: 58mL Perfusion (Tmax>6.0s) volume: 51mL Mismatch Volume: 32mL, however the core infarct on noncontrast head CT is not being picked up by the automated processing and the true penumbra is less than 12 mL Infarction Location: Right MCA IMPRESSION: 1. Multiple missing right MCA branch vessels in the superior division with wide patency of the M1 segment and no discrete proximal M2 occlusion identified. 2. Widely patent common carotid, internal carotid, and vertebral arteries. 3. CT perfusion demonstrating at most a small penumbra as detailed above. These results were called by telephone at the time of interpretation on 02/14/2020 at 7:55 p.m. to Dr. Laurence Slate, who verbally acknowledged these results. Electronically Signed   By: Sebastian Ache M.D.   On: 02/14/2020 20:22   MR BRAIN WO CONTRAST  Result Date: 02/15/2020 CLINICAL DATA:  Stroke.  HIV. EXAM: MRI HEAD WITHOUT CONTRAST TECHNIQUE: Multiplanar, multiecho pulse sequences of the brain and surrounding structures were obtained without intravenous contrast. COMPARISON:  CT head 02/14/2020 FINDINGS: Brain: Acute right MCA infarct involving the right lenticular nucleus as well as the right frontal operculum and right parietal operculum. Small area of acute infarction in the genu internal capsule on the right. Small area of associated hemorrhage in the right frontal operculum and right parietal operculum. Small areas of acute infarct in the left basal ganglia involving the genu internal capsule and posterior external capsule fibers. Ventricle size normal.  No midline shift.  Negative for mass lesion. Vascular: Normal arterial flow voids. Skull and upper cervical spine: No focal skeletal lesion. Sinuses/Orbits: Mild mucosal edema paranasal sinuses. Negative orbit Other: None IMPRESSION: Acute infarct right MCA territory involving the basal ganglia as well as the right frontal and parietal operculum where  there are small areas of associated hemorrhage. Small area of acute infarct in the left internal capsule and external capsule. Electronically Signed   By: Marlan Palau M.D.   On: 02/15/2020 07:27   CT CEREBRAL PERFUSION W CONTRAST  Result Date: 02/14/2020 CLINICAL DATA:  Stroke. Right-sided weakness and speech disturbance. EXAM: CT ANGIOGRAPHY HEAD AND NECK CT PERFUSION BRAIN TECHNIQUE: Multidetector CT imaging of the head and neck was performed using the standard protocol during bolus administration of intravenous contrast. Multiplanar CT image reconstructions and MIPs were obtained to evaluate the vascular anatomy. Carotid stenosis measurements (when applicable) are obtained utilizing NASCET criteria, using the distal internal carotid diameter as the denominator. Multiphase CT imaging of the brain was performed following IV bolus contrast injection. Subsequent parametric perfusion maps were calculated using RAPID software. CONTRAST:  60mL OMNIPAQUE IOHEXOL 350 MG/ML SOLN; 20mL OMNIPAQUE IOHEXOL 350 MG/ML SOLN COMPARISON:  None. FINDINGS: CTA NECK FINDINGS Aortic arch: Standard 3 vessel aortic arch with widely patent arch vessel origins. Right carotid system: Patent and smooth without evidence of stenosis or dissection. Left carotid  system: Patent and smooth without evidence of stenosis or dissection. Vertebral arteries: Patent, smooth, and codominant without evidence of stenosis or dissection. Skeleton: Poor dentition with multiple periapical erosions. Other neck: Borderline enlarged level II lymph nodes bilaterally. Upper chest: Centrilobular emphysema. Small superior mediastinal lymph nodes. Review of the MIP images confirms the above findings CTA HEAD FINDINGS Anterior circulation: The internal carotid arteries are widely patent from skull base to carotid termini. The A1 and M1 segments are widely patent with the left A1 segment being mildly hypoplastic. There is a decreased number of branch vessels in  the right MCA superior division with suggestion of occlusion and intermittent reconstitution of distal M2 and M3 branches. There is also multifocal irregular narrowing of M3 branches in the right MCA inferior division, and there is mild left MCA branch vessel irregularity. No aneurysm is identified. Posterior circulation: The intracranial vertebral arteries are widely patent to the basilar. The basilar artery is widely patent. There are small posterior communicating arteries bilaterally. PCAs are patent with mild branch vessel irregularity more notable on the right without evidence of flow limiting proximal stenosis. No aneurysm is identified. Venous sinuses: Patent. Anatomic variants: None. Review of the MIP images confirms the above findings CT Brain Perfusion Findings: ASPECTS: 4 CBF (<30%) Volume: 68mL Perfusion (Tmax>6.0s) volume: 69mL Mismatch Volume: 84mL, however the core infarct on noncontrast head CT is not being picked up by the automated processing and the true penumbra is less than 12 mL Infarction Location: Right MCA IMPRESSION: 1. Multiple missing right MCA branch vessels in the superior division with wide patency of the M1 segment and no discrete proximal M2 occlusion identified. 2. Widely patent common carotid, internal carotid, and vertebral arteries. 3. CT perfusion demonstrating at most a small penumbra as detailed above. These results were called by telephone at the time of interpretation on 02/14/2020 at 7:55 p.m. to Dr. Laurence Slate, who verbally acknowledged these results. Electronically Signed   By: Sebastian Ache M.D.   On: 02/14/2020 20:22   DG Chest Port 1 View  Result Date: 02/14/2020 CLINICAL DATA:  Acute CVA. Evaluate for pneumonia. EXAM: PORTABLE CHEST 1 VIEW COMPARISON:  Radiograph 07/07/2019. Lung apices from CT angiography earlier today. FINDINGS: Ill-defined opacity in the right mid lung abutting the minor fissure. Mild cardiomegaly. Normal mediastinal contours. No pulmonary edema, large  pleural effusion or pneumothorax. No acute osseous abnormalities are seen. IMPRESSION: Ill-defined opacity in the right mid lung abutting the minor fissure, suspicious for pneumonia. Mild cardiomegaly. Electronically Signed   By: Narda Rutherford M.D.   On: 02/14/2020 21:58   DG Abd Portable 1V  Result Date: 02/15/2020 CLINICAL DATA:  MRI clearance EXAM: PORTABLE ABDOMEN - 1 VIEW COMPARISON:  None. FINDINGS: The bowel gas pattern is normal. No radio-opaque calculi or other significant radiographic abnormality are seen. IMPRESSION: No radial pain metallic foreign body in the abdomen or pelvis. Electronically Signed   By: Elige Ko   On: 02/15/2020 05:57   CT HEAD CODE STROKE WO CONTRAST  Result Date: 02/14/2020 CLINICAL DATA:  Code stroke.  Right-sided weakness. EXAM: CT HEAD WITHOUT CONTRAST TECHNIQUE: Contiguous axial images were obtained from the base of the skull through the vertex without intravenous contrast. COMPARISON:  03/07/2014 FINDINGS: Brain: There is an acute right basal ganglia infarct involving the caudate nucleus, lentiform nucleus, and a portion of the anterior limb of the right internal capsule. There is a subcentimeter focus of acute hemorrhage superficially in the right frontal operculum with a small amount of  surrounding low-density, and there is also loss of gray-white differentiation indicative of an acute infarct in the anterior aspect of the right insula and more anterior aspect of the right frontal lobe. There is a new age indeterminate lacunar infarct in the genu of the left internal capsule. The ventricles are normal in size. There is no midline shift or extra-axial fluid collection. Vascular: Hyperdense right MCA. Skull: No fracture or suspicious osseous lesion. Sinuses/Orbits: Partially visualized small mucous retention cyst in the right maxillary sinus. Clear mastoid air cells. Rightward gaze. Other: None. ASPECTS [facility] Stroke Program Early CT Score) - Ganglionic level  infarction (caudate, lentiform nuclei, internal capsule, insula, M1-M3 cortex): 1 - Supraganglionic infarction (M4-M6 cortex): 3 Total score (0-10 with 10 being normal): 4 IMPRESSION: 1. 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 2. ASPECTS is 4. 3. 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 These results were communicated to Dr. Laurence Slate at 7:29 pmon 02/14/2020 by text page via the [facility] messaging system. Electronically Signed   By: Sebastian Ache M.D.   On: 02/14/2020 19:30     Transthoracic Echocardiogram  00/00/2021 Pending    Bilateral Carotid Dopplers  00/00/2021 Pending   ECG - NSR. (See cardiology reading for complete details)      PHYSICAL EXAM Blood pressure (!) 130/96, pulse 80, temperature 99.8 F (37.7 C), temperature source Oral, resp. rate 20, height 5\' 8"  (1.727 m), weight 69 kg, SpO2 100 %.  Sitting in bed with eyes closed. Will not open eyes to verbal or tactile stimulation, except for short period with pain. Non-verbal. Will not follow commands. Withdraws slightly to pain in BLE but not BUE.   Avoids arm drop on the face with BUE. Coord/sensory/gait- could not assess due to cooperation.       HOME MEDICATIONS:  Medications Prior to Admission  Medication Sig Dispense Refill  . bictegravir-emtricitabine-tenofovir AF (BIKTARVY) 50-200-25 MG TABS tablet Take 1 tablet by mouth daily. Try to take at the same time each day with or without food. 30 tablet 5  . sulfamethoxazole-trimethoprim (BACTRIM DS) 800-160 MG tablet Take 1 tablet by mouth daily. 30 tablet 5      HOSPITAL MEDICATIONS:  .  stroke: mapping our early stages of recovery book   Does not apply Once  . bictegravir-emtricitabine-tenofovir AF  1 tablet Oral Daily  . dexamethasone (DECADRON) injection  6 mg Intravenous Q24H  . sulfamethoxazole-trimethoprim  1 tablet Oral Daily     ALLERGIES No Known Allergies  PAST MEDICAL HISTORY Past Medical History:  Diagnosis Date  . History of syphilis 09/30/2019  . HIV (human immunodeficiency virus infection) (HCC)   . Male-to-male transgender person     SURGICAL HISTORY Past Surgical History:  Procedure Laterality Date  . APPENDECTOMY      FAMILY HISTORY Family History  Problem Relation Age of Onset  . Cancer Neg Hx   . Diabetes Neg Hx     SOCIAL HISTORY  reports that she has been smoking cigarettes. She has been smoking about 0.25 packs per day. She has never used smokeless tobacco. She reports current alcohol use. She reports current drug use. Drugs: Marijuana and Cocaine.  ASSESSMENT/PLAN  Stroke:  Multiple bilateral infarcts  Resultant  Altered mental status and generalized weakness.    Code Stroke CT Head -      CT head   MRI head  MRA head   CTA  H&N   CT Perfusion  Carotid Doppler - not needed  2D Echo - pending  Sars Corona Virus 2  Positive.    LDL - 71    Component Value Date/Time   LDLCALC 71 02/15/2020 0052   LDLCALC 55 09/15/2019 1446     HgbA1c - 5.9  UDS positive for cocaine  VTE prophylaxis - SCDs Diet  Diet Order            Diet NPO time specified  Diet effective now               Hypertension Home BP meds: none   Hyperlipidemia Home Lipid lowering medication: none    Hospital day # 1  Impression: Multiple bilateral acute infarcts with mental status changes.  Although cardioembolism is possible, I think that in this situation it is less likely.  I suspect strokes are related to vasoconstrictive effects of cocaine in combination with hypercoagulability from COVID19.    He should be anticoagulated.  The small hemorrhage seen on CT has not been confirmed on MRI and is not likely to be clinically significant.  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  Although lymphocytic pleocytosis is more expected with a viral  encephalitis, in very early stages you can see neutrophilic pleocytosis.  She also has a history of syphilis and not clear if it was properly treated in the past.  She is on PCN for that too.    A TEE is recommended to ensure no intracardiac thrombus or valvular vegetations if the TTE is normal.    Mental status change likely related to combination of strokes and encephalitis.  No clear description of a seizure in the records.  An EEG is pending.    Weston SettleShervin Javarious Elsayed, MS, MD  To contact Stroke Continuity provider, please refer to [email or web address]. After hours, contact General Neurology

## 2020-02-15 NOTE — Progress Notes (Signed)
Bodenheimer informed pts spinal fluid tube 4 41

## 2020-02-15 NOTE — Progress Notes (Signed)
Family Update   Primary RN spoke with patients mother, father and sister concerning patients status. Patients father will be primary point of contact until patient is alert enough to say otherwise. Primary MD notified to give status update and POC to father.

## 2020-02-15 NOTE — Plan of Care (Signed)
  Problem: Education: Goal: Knowledge of General Education information will improve Description: Including pain rating scale, medication(s)/side effects and non-pharmacologic comfort measures Outcome: Progressing   Problem: Health Behavior/Discharge Planning: Goal: Ability to manage health-related needs will improve Outcome: Progressing   Problem: Clinical Measurements: Goal: Ability to maintain clinical measurements within normal limits will improve Outcome: Progressing Goal: Will remain free from infection Outcome: Progressing Goal: Diagnostic test results will improve Outcome: Progressing Goal: Respiratory complications will improve Outcome: Progressing Goal: Cardiovascular complication will be avoided Outcome: Progressing   Problem: Activity: Goal: Risk for activity intolerance will decrease Outcome: Progressing   Problem: Nutrition: Goal: Adequate nutrition will be maintained Outcome: Progressing   Problem: Coping: Goal: Level of anxiety will decrease Outcome: Progressing   Problem: Elimination: Goal: Will not experience complications related to bowel motility Outcome: Progressing Goal: Will not experience complications related to urinary retention Outcome: Progressing   Problem: Pain Managment: Goal: General experience of comfort will improve Outcome: Progressing   Problem: Safety: Goal: Ability to remain free from injury will improve Outcome: Progressing   Problem: Skin Integrity: Goal: Risk for impaired skin integrity will decrease Outcome: Progressing   Problem: Education: Goal: Knowledge of disease or condition will improve Outcome: Progressing Goal: Knowledge of secondary prevention will improve Outcome: Progressing Goal: Individualized Educational Video(s) Outcome: Progressing   Problem: Coping: Goal: Will verbalize positive feelings about self Outcome: Progressing Goal: Will identify appropriate support needs Outcome: Progressing    Problem: Health Behavior/Discharge Planning: Goal: Ability to manage health-related needs will improve Outcome: Progressing   

## 2020-02-16 ENCOUNTER — Other Ambulatory Visit: Payer: Self-pay

## 2020-02-16 ENCOUNTER — Inpatient Hospital Stay (HOSPITAL_COMMUNITY): Payer: Medicaid Other

## 2020-02-16 DIAGNOSIS — F1410 Cocaine abuse, uncomplicated: Secondary | ICD-10-CM

## 2020-02-16 DIAGNOSIS — U071 COVID-19: Secondary | ICD-10-CM | POA: Diagnosis present

## 2020-02-16 DIAGNOSIS — I639 Cerebral infarction, unspecified: Secondary | ICD-10-CM

## 2020-02-16 LAB — COMPREHENSIVE METABOLIC PANEL
ALT: 10 U/L (ref 0–44)
AST: 22 U/L (ref 15–41)
Albumin: 1.8 g/dL — ABNORMAL LOW (ref 3.5–5.0)
Alkaline Phosphatase: 43 U/L (ref 38–126)
Anion gap: 10 (ref 5–15)
BUN: 8 mg/dL (ref 6–20)
CO2: 24 mmol/L (ref 22–32)
Calcium: 8.7 mg/dL — ABNORMAL LOW (ref 8.9–10.3)
Chloride: 101 mmol/L (ref 98–111)
Creatinine, Ser: 0.71 mg/dL (ref 0.61–1.24)
GFR calc Af Amer: >60 mL/min (ref >60)
GFR calc non Af Amer: >60 mL/min (ref >60)
Glucose, Bld: 102 mg/dL — ABNORMAL HIGH (ref 70–99)
Potassium: 3.8 mmol/L (ref 3.5–5.1)
Sodium: 135 mmol/L (ref 135–145)
Total Bilirubin: 0.6 mg/dL (ref 0.3–1.2)
Total Protein: 7.6 g/dL (ref 6.5–8.1)

## 2020-02-16 LAB — FERRITIN: Ferritin: 863 ng/mL — ABNORMAL HIGH (ref 24–336)

## 2020-02-16 LAB — C-REACTIVE PROTEIN: CRP: 5.7 mg/dL — ABNORMAL HIGH (ref <1.0)

## 2020-02-16 LAB — PATHOLOGIST SMEAR REVIEW

## 2020-02-16 LAB — URINE CULTURE

## 2020-02-16 LAB — D-DIMER, QUANTITATIVE: D-Dimer, Quant: 3.32 ug/mL-FEU — ABNORMAL HIGH (ref 0.00–0.50)

## 2020-02-16 LAB — VDRL, CSF: VDRL Quant, CSF: NONREACTIVE

## 2020-02-16 LAB — T.PALLIDUM AB, TOTAL: T Pallidum Abs: REACTIVE — AB

## 2020-02-16 MED ORDER — INFLUENZA VAC SPLIT QUAD 0.5 ML IM SUSY: 0.5000 mL | PREFILLED_SYRINGE | INTRAMUSCULAR | Status: DC

## 2020-02-16 MED ORDER — ENOXAPARIN SODIUM 40 MG/0.4ML ~~LOC~~ SOLN
40.0000 mg | SUBCUTANEOUS | Status: DC
  Administered 2020-02-17 – 2020-02-24 (×8): 40 mg via SUBCUTANEOUS
  Filled 2020-02-16 (×8): qty 0.4

## 2020-02-16 MED ORDER — PNEUMOCOCCAL VAC POLYVALENT 25 MCG/0.5ML IJ INJ
0.5000 mL | INJECTION | INTRAMUSCULAR | Status: DC
  Filled 2020-02-16: qty 0.5

## 2020-02-16 MED ORDER — ASPIRIN EC 81 MG PO TBEC: 81.0000 mg | DELAYED_RELEASE_TABLET | Freq: Every day | ORAL | Status: DC

## 2020-02-16 NOTE — Progress Notes (Signed)
Family Update  Primary nurse spoke with patients father Tyler Vargas this morning concerning patient status and POC. [name] requested to speak with primary MD. Primary nurse sent message to Dr Ella Jubilee to update [facility] (father) once he makes his rounds.

## 2020-02-16 NOTE — Progress Notes (Signed)
Physical Therapy Treatment Patient Details Name: Tyler Vargas MRN: [id number] DOB: October 20, 1986 Today's Date: 02/16/2020    History of Present Illness 34 y.o. adult with medical history significant for HIV/AIDS with CD4 count 84 and viral load 1.8 million earlier this month, latent syphilis, and cocaine abuse, now presenting to the emergency department with weakness. Pt aphasic and initially not following commands in ED, eventually able to follow commands but not provide history.  CT head is concerning for acute right MCA infarctions involving the frontal lobe, insula, and basal ganglia with small focus of hemorrhage in the right frontal operculum.  Also noted on CT is a new age-indeterminate lacunar infarction in the left internal capsule. Pt also found to be COVID +. Admitted 3/27 for treatment of acute CVA.    PT Comments    Pt is much more interactive today, however continues to be non-verbal, communicating with head nods and blinking. Pt is regaining R sided strength and is 2/5 in UE/LE, also able to open R eye today. Unclear, how her sight is effected, despite questioning. Pt requires maxA to come to sitting EoB. Pt able to move L LE off bed and use L UE to pull on bed rail, however requires maxA to bring her R side to seated. Pt able to tolerate sitting for 8 min EoB working on balance, and able to progress to min guard for about 10 sec. Pt with increased fatigue and requires total A to return to supine. D/c plans remain appropriate at this time.PT will continue to follow acutely.    Follow Up Recommendations  SNF     Equipment Recommendations  Other (comment)(TBD at next venue)       Precautions / Restrictions Precautions Precautions: Fall Restrictions Weight Bearing Restrictions: No    Mobility  Bed Mobility Overal bed mobility: Needs Assistance Bed Mobility: Supine to Sit;Sit to Supine     Supine to sit: Max assist;HOB elevated Sit to supine: Total assist;HOB elevated    General bed mobility comments: max A for coming to sit EOB with guidance of L UE to hold bed rail, pt able to swing L LE off bed, PT provided maxA to bring R side around to EoB, after sitting 8 minutes pt obviously fatigued and requires total A for return to bed and scoot up in bed with assist of 2               Balance Overall balance assessment: Needs assistance Sitting-balance support: Feet supported;Single extremity supported Sitting balance-Leahy Scale: Poor Sitting balance - Comments: pt able to utilize bed rail to assist in holding herself up, however needs outside assist to maintain balance Postural control: Posterior lean;Left lateral lean                                  Cognition Arousal/Alertness: Lethargic Behavior During Therapy: Flat affect Overall Cognitive Status: Difficult to assess                                 General Comments: more awake today, answers ocassional yes/no question with head nod         General Comments General comments (skin integrity, edema, etc.): VSS on RA,       Pertinent Vitals/Pain Pain Assessment: Faces Faces Pain Scale: No hurt    Home Living Family/patient expects to be discharged to:: Inpatient rehab  Living Arrangements: Non-relatives/Friends                      PT Goals (current goals can now be found in the care plan section) Acute Rehab PT Goals Patient Stated Goal: none stated PT Goal Formulation: Patient unable to participate in goal setting Time For Goal Achievement: Mar 23, 2020 Potential to Achieve Goals: Fair Progress towards PT goals: Progressing toward goals    Frequency    Min 3X/week      PT Plan Current plan remains appropriate       AM-PAC PT "6 Clicks" Mobility   Outcome Measure  Help needed turning from your back to your side while in a flat bed without using bedrails?: Total Help needed moving from lying on your back to sitting on the side of a flat bed  without using bedrails?: Total Help needed moving to and from a bed to a chair (including a wheelchair)?: Total Help needed standing up from a chair using your arms (e.g., wheelchair or bedside chair)?: Total Help needed to walk in hospital room?: Total Help needed climbing 3-5 steps with a railing? : Total 6 Click Score: 6    End of Session   Activity Tolerance: Patient limited by lethargy Patient left: in bed;with call bell/phone within reach;with bed alarm set Nurse Communication: Other (comment)(decreased ability to participate due to lethargy) PT Visit Diagnosis: Unsteadiness on feet (R26.81);Other abnormalities of gait and mobility (R26.89);Muscle weakness (generalized) (M62.81);Difficulty in walking, not elsewhere classified (R26.2);Hemiplegia and hemiparesis Hemiplegia - Right/Left: Right Hemiplegia - caused by: Cerebral infarction     Time: 4401-0272 PT Time Calculation (min) (ACUTE ONLY): 16 min  Charges:  $Therapeutic Activity: 8-22 mins                     Jaden Abreu B. Beverely Risen PT, DPT Acute Rehabilitation Services Pager [phone] Office [phone]    Elon Alas Fleet 02/16/2020, 3:17 PM

## 2020-02-16 NOTE — Progress Notes (Signed)
Patient ID: Tyler Vargas, adult   DOB: [birth year], 34 y.o.   MRN: [id number]         [facility]  Date of Admission:  02/14/2020           Day 2 remdesivir and steroids ASSESSMENT: She has COVID-19 infection superimposed upon advanced HIV infection.  She also has multiple bilateral CVAs.  No vegetations were seen TTE.  I am not sure how to put all of this together.  Given her viral load of over 1.8 million I suspect that her lymphocytic pleocytosis is due to her HIV infection but need to await CSF VDRL with the possibility of neurosyphilis.  PLAN: 1. Continue remdesivir and steroids 2. Continue Biktarvy  Principal Problem:   Acute CVA (cerebrovascular accident) (HCC) Active Problems:   AIDS (acquired immune deficiency syndrome) (HCC)   History of syphilis   Cocaine use disorder, mild, abuse (HCC)   Scheduled Meds: .  stroke: mapping our early stages of recovery book   Does not apply Once  . bictegravir-emtricitabine-tenofovir AF  1 tablet Oral Daily  . dexamethasone (DECADRON) injection  6 mg Intravenous Q24H  . enoxaparin (LOVENOX) injection  1 mg/kg Subcutaneous Q12H  . sulfamethoxazole-trimethoprim  1 tablet Oral Daily   Continuous Infusions: . dextrose 5% lactated ringers 75 mL/hr at 02/16/20 0402  . remdesivir 100 mg in NS 100 mL     PRN Meds:.acetaminophen **OR** acetaminophen (TYLENOL) oral liquid 160 mg/5 mL **OR** acetaminophen, senna-docusate  Review of Systems: Review of Systems  Unable to perform ROS: Mental acuity    No Known Allergies  OBJECTIVE: Vitals:   02/16/20 0500 02/16/20 0507 02/16/20 0725 02/16/20 0800  BP:   108/76 109/76  Pulse: 65  67 68  Resp: 16  20 20   Temp:  97.6 F (36.4 C) 97.7 F (36.5 C) 97.7 F (36.5 C)  TempSrc:  Oral Oral Oral  SpO2: 97%  99% 98%  Weight:      Height:       Body mass index is 23.13 kg/m.  Physical Exam Constitutional:      Comments: She is resting quietly in bed.  She does  not respond to questions.  She did open her eyes.  Cardiovascular:     Rate and Rhythm: Normal rate and regular rhythm.     Heart sounds: No murmur.  Pulmonary:     Effort: Pulmonary effort is normal.     Breath sounds: Normal breath sounds.  Abdominal:     Palpations: Abdomen is soft.  Musculoskeletal:        General: No swelling or tenderness.     Cervical back: Neck supple.  Skin:    Findings: No rash.     Lab Results Lab Results  Component Value Date   WBC 11.4 (H) 02/14/2020   HGB 11.2 (L) 02/14/2020   HCT 33.0 (L) 02/14/2020   MCV 95.4 02/14/2020   PLT 561 (H) 02/14/2020    Lab Results  Component Value Date   CREATININE 0.71 02/16/2020   BUN 8 02/16/2020   NA 135 02/16/2020   K 3.8 02/16/2020   CL 101 02/16/2020   CO2 24 02/16/2020    Lab Results  Component Value Date   ALT 10 02/16/2020   AST 22 02/16/2020   ALKPHOS 43 02/16/2020   BILITOT 0.6 02/16/2020    HIV 1 RNA Quant (copies/mL)  Date Value  01/28/2020 1,810,000 (H)  10/30/2019 880 (H)  09/15/2019 1,400,000 (H)  CD4 T Cell Abs (/uL)  Date Value  01/28/2020 81 (L)  09/15/2019 38 (L)  07/07/2019 44 (L)   Microbiology: Recent Results (from the past 240 hour(s))  Culture, blood (Routine X 2) w Reflex to ID Panel     Status: None (Preliminary result)   Collection Time: 02/14/20  9:40 PM   Specimen: BLOOD  Result Value Ref Range Status   Specimen Description BLOOD RIGHT ARM  Final   Special Requests   Final    BOTTLES DRAWN AEROBIC AND ANAEROBIC Blood Culture adequate volume Performed at [facility], [address], [place], [place] [address]    Culture NO GROWTH 2 DAYS  Final   Report Status PENDING  Incomplete  Culture, blood (Routine X 2) w Reflex to ID Panel     Status: None (Preliminary result)   Collection Time: 02/14/20  9:46 PM   Specimen: BLOOD  Result Value Ref Range Status   Specimen Description BLOOD RIGHT FOREARM  Final   Special Requests   Final    BOTTLES DRAWN  AEROBIC AND ANAEROBIC Blood Culture adequate volume Performed at [facility], [address], [place], [place] [address]    Culture NO GROWTH 2 DAYS  Final   Report Status PENDING  Incomplete  Urine culture     Status: Abnormal   Collection Time: 02/14/20  9:46 PM   Specimen: In/Out Cath Urine  Result Value Ref Range Status   Specimen Description IN/OUT CATH URINE  Final   Special Requests   Final    NONE Performed at [facility], [address] [place], [place] [address]    Culture MULTIPLE SPECIES PRESENT, SUGGEST RECOLLECTION (A)  Final   Report Status 02/16/2020 FINAL  Final  SARS CORONAVIRUS 2 (TAT 6-24 HRS)     Status: Abnormal   Collection Time: 02/14/20 10:55 PM  Result Value Ref Range Status   SARS Coronavirus 2 POSITIVE (A) NEGATIVE Final    Comment: RESULT CALLED TO, READ BACK BY AND VERIFIED WITH: K. GUY,RN 0335 02/15/2020 T. TYSOR (NOTE) SARS-CoV-2 target nucleic acids are DETECTED. The SARS-CoV-2 RNA is generally detectable in upper and lower respiratory specimens during the acute phase of infection. Positive results are indicative of the presence of SARS-CoV-2 RNA. Clinical correlation with patient history and other diagnostic information is  necessary to determine patient infection status. Positive results do not rule out bacterial infection or co-infection with other viruses.  The expected result is Negative. Fact Sheet for Patients: HairSlick.no Fact Sheet for Healthcare Providers: quierodirigir.com This test is not yet approved or cleared by the [place] FDA and  has been authorized for detection and/or diagnosis of SARS-CoV-2 by FDA under an Emergency Use Authorization (EUA). This EUA will remain  in effect (meaning this test can be used) for the  duration of the COVID-19 declaration under Section 564(b)(1) of the Act, 21 U.S.C. section 360bbb-3(b)(1), unless the authorization is  terminated or revoked sooner. Performed at [facility], [address], [place], [place] [address]   CSF culture with Stat gram stain     Status: None (Preliminary result)   Collection Time: 02/14/20 11:35 PM   Specimen: CSF; Cerebrospinal Fluid  Result Value Ref Range Status   Specimen Description CSF  Final   Special Requests NONE  Final   Gram Stain   Final    CYTOSPIN SMEAR WBC PRESENT,BOTH PMN AND MONONUCLEAR NO ORGANISMS SEEN Performed at [facility], [address] [place], [place] [address]    Culture NO GROWTH 1 DAY  Final   Report Status PENDING  Incomplete    Michel Bickers, MD [facility] [place] [facility] [phone] pager   [phone] cell 02/16/2020, 10:04 AM

## 2020-02-16 NOTE — Progress Notes (Signed)
TCD with bubbles has been completed. Preliminary results can be found in CV Proc through chart review.   02/16/20 3:17 PM Olen Cordial RVT

## 2020-02-16 NOTE — Progress Notes (Addendum)
PROGRESS NOTE    Tyler Vargas  ACZ:[id number] DOB: [birth year] DOA: 02/14/2020 PCP: Patient, No Pcp Per    Brief Narrative:  Patient was admitted to the hospital with a working diagnosis of acute right MCA infarct, ischemic/hemorrhagic, in the setting of acute SARS COVID-19 viral pneumonia.    34 year old male who presented with weakness.  He does have significant past medical history of HIV/AIDS, CD4 count 84, viral load 1.8, latent syphilis, and cocaine abuse.  EMS was called due to generalized weakness for about 2 days.  In the emergency department he cannot give any history due to altered mental status.  His blood pressure was 142/115, heart rate 90, respiratory 22, oxygen saturation 100%, he was aphasic, strength 4 out of 5 on the right side, not fully following commands, his lungs are clear to auscultation bilaterally, heart S1-S2 present rhythmic, soft abdomen, no lower extremity edema. Sodium 132, potassium 3.7, chloride 97, bicarb 23, glucose 114, BUN 9, creatinine 0.86, white count 11.4, hemoglobin 10.6, hematocrit 35.0, platelets 561.  SARS COVID-19 was positive, urinalysis specific gravity more than 1.046, 0-5 white cells, lumbar puncture, glucose 42, white cells 39, lymphocytes 52, protein 72, drug screen positive for cocaine.  Head CT with acute right MCA infarct involving the frontal lobe, insula and basal ganglia with small focus of hemorrhage in the right frontal operculum.  Head and neck angiography with multiple missing right MCA branch vessels in the superior division.  His chest x-ray had bilateral interstitial infiltrates at bases.  Further work-up with brain MRI confirmed acute infarct right MCA territory involving the basal ganglia as well as right frontal and parietal operculum, positive small areas of associated hemorrhage.   Assessment & Plan:   Principal Problem:   Acute CVA (cerebrovascular accident) (HCC) Active Problems:   AIDS (acquired immune deficiency  syndrome) (HCC)   History of syphilis    1. Acute right MCA infarct. possible increase thrombosis due to covid 19 viral infection. Patient continue to have aphasia and right sided weakness,  EEG with no seizure activity. Echocardiogram with no signs of thrombosis, LV function is preserved.   Continue with neuro checks and aspiration precautions. Patient with poor oral intake, continue IV fluids for hydration, follow with speech therapy evaluation and further neurology recommendations. Continue full anticoagulation per neurology recommendations.   Patient will need snf.   2. SARS COVID 19 viral pneumonia.   RR: 14   Pulse oxymetry: 99%  Fi02: 21% room air.   COVID-19 Labs  Recent Labs    02/16/20 0828  DDIMER 3.32*  FERRITIN 863*  CRP 5.7*    Lab Results  Component Value Date   SARSCOV2NAA POSITIVE (A) 02/14/2020     Continue medical therapy with remdesivir and systemic steroids with dexemethasone.  Patient with severe immunosuppression at high risk for worsening viral pneumonia.   3. HIV/AIDS/ latent syphilis (CD 4 81/ viral load 1,810,000) . On antiretroviral, discontinue penicillin and vancomycin per ID recommendations, no signs of bacterial meningitis.  Continue prophylaxis with bactrim   DVT prophylaxis: enoxaparin   Code Status:  full Family Communication: I spoke over the phone with the patient's father about patient's  condition, plan of care, prognosis and all questions were addressed. Disposition Plan/ discharge patient from home, barrier for discharge acute cva and covid infection, will need further inpatient care and plan for dc to snf.    Consultants:   ID   Neurology    Subjective: Patient continue to be non verbal but he  dose follow commands, noted paresis of the right upper extremity. No apparent nausea or dyspnea.   Objective: Vitals:   02/15/20 2327 02/16/20 0358 02/16/20 0500 02/16/20 0507  BP: 115/88 106/74    Pulse: 61 66 65     Resp: 14 20 16    Temp: (!) 97.3 F (36.3 C) (!) 96.3 F (35.7 C)  97.6 F (36.4 C)  TempSrc: Axillary Axillary  Oral  SpO2: 99% 96% 97%   Weight:      Height:        Intake/Output Summary (Last 24 hours) at 02/16/2020 0813 Last data filed at 02/16/2020 02/18/2020 Gross per 24 hour  Intake 1533.72 ml  Output 2300 ml  Net -766.28 ml   Filed Weights   02/14/20 2205 02/15/20 0157 02/15/20 0450  Weight: 65.9 kg 66.9 kg 69 kg    Examination:   General: Not in pain or dyspnea, deconditioned and ill looking appearing  Neurology: Awake, non verbal, right facial droop and right upper extremity paresis, strength 3/5 left side and right leg.   E ENT: no pallor, no icterus, oral mucosa moist Cardiovascular: No JVD. S1-S2 present, rhythmic, no gallops, rubs, or murmurs. No lower extremity edema. Pulmonary: posittive breath sounds bilaterally, no wheezing, rhonchi or rales. Gastrointestinal. Abdomen with no organomegaly, non tender, no rebound or guarding Skin. No rashes Musculoskeletal: no joint deformities     Data Reviewed: I have personally reviewed following labs and imaging studies  CBC: Recent Labs  Lab 02/14/20 1905 02/14/20 1911  WBC 11.4*  --   NEUTROABS 9.9*  --   HGB 10.6* 11.2*  HCT 35.0* 33.0*  MCV 95.4  --   PLT 561*  --    Basic Metabolic Panel: Recent Labs  Lab 02/14/20 1905 02/14/20 1911 02/15/20 0052  NA 132* 133* 132*  K 3.7 3.7 3.7  CL 97* 97* 97*  CO2 23  --  24  GLUCOSE 114* 115* 111*  BUN 9 9 7   CREATININE 0.86 0.80 0.88  CALCIUM 8.9  --  8.6*   GFR: Estimated Creatinine Clearance (by C-G formula based on SCr of 0.88 mg/dL) Male: 02/17/20 mL/min Male: 115.5 mL/min Liver Function Tests: Recent Labs  Lab 02/14/20 1905 02/15/20 0052  AST 25 24  ALT 13 12  ALKPHOS 56 55  BILITOT 0.5 0.7  PROT 9.4* 8.3*  ALBUMIN 2.3* 2.0*   No results for input(s): LIPASE, AMYLASE in the last 168 hours. No results for input(s): AMMONIA in the last 168  hours. Coagulation Profile: Recent Labs  Lab 02/14/20 1905  INR 1.1   Cardiac Enzymes: No results for input(s): CKTOTAL, CKMB, CKMBINDEX, TROPONINI in the last 168 hours. BNP (last 3 results) No results for input(s): PROBNP in the last 8760 hours. HbA1C: Recent Labs    02/15/20 0052  HGBA1C 5.9*   CBG: No results for input(s): GLUCAP in the last 168 hours. Lipid Profile: Recent Labs    02/15/20 0052  CHOL 108  HDL 18*  LDLCALC 71  TRIG 97  CHOLHDL 6.0   Thyroid Function Tests: No results for input(s): TSH, T4TOTAL, FREET4, T3FREE, THYROIDAB in the last 72 hours. Anemia Panel: No results for input(s): VITAMINB12, FOLATE, FERRITIN, TIBC, IRON, RETICCTPCT in the last 72 hours.    Radiology Studies: I have reviewed all of the imaging during this hospital visit personally     Scheduled Meds: .  stroke: mapping our early stages of recovery book   Does not apply Once  . bictegravir-emtricitabine-tenofovir AF  1 tablet Oral Daily  . dexamethasone (DECADRON) injection  6 mg Intravenous Q24H  . enoxaparin (LOVENOX) injection  1 mg/kg Subcutaneous Q12H  . sulfamethoxazole-trimethoprim  1 tablet Oral Daily   Continuous Infusions: . dextrose 5% lactated ringers 75 mL/hr at 02/16/20 0402  . remdesivir 100 mg in NS 100 mL       LOS: 2 days        Abbi Mancini Gerome Apley, MD

## 2020-02-16 NOTE — Progress Notes (Addendum)
STROKE TEAM PROGRESS NOTE     INTERVAL HISTORY I have personally reviewed history of presenting illness, electronic medical records and imaging films in PACS. Patient remains aphasic and nonverbal but following only occasional commands.  Echocardiogram done yesterday shows normal ejection fraction without obvious cardiac source of embolism or valvular dysfunction, vegetation or endocarditis.  EEG done yesterday is within normal limits.  Transcranial Doppler bubble study was performed by me personally at the bedside and is negative for evidence of right-to-left intracardiac shunt   OBJECTIVE Vitals:   02/16/20 0507 02/16/20 0725 02/16/20 0800 02/16/20 1150  BP:  108/76 109/76 120/77  Pulse:  67 68 69  Resp:  Temp: 97.6 F (36.4 C) 97.7 F (36.5 C) 97.7 F (36.5 C) 97.8 F (36.6 C)  TempSrc: Oral Oral Oral Oral  SpO2:  99% 98% 100%  Weight:      Height:        CBC:  Recent Labs  Lab 02/14/20 1905 02/14/20 1911  WBC 11.4*  --   NEUTROABS 9.9*  --   HGB 10.6* 11.2*  HCT 35.0* 33.0*  MCV 95.4  --   PLT 561*  --     Basic Metabolic Panel:  Recent Labs  Lab 02/15/20 0052 02/16/20 0828  NA 132* 135  K 3.7 3.8  CL 97* 101  CO2 24 24  GLUCOSE 111* 102*  BUN 7 8  CREATININE 0.88 0.71  CALCIUM 8.6* 8.7*    Lipid Panel:     Component Value Date/Time   CHOL 108 02/15/2020 0052   TRIG 97 02/15/2020 0052   HDL 18 (L) 02/15/2020 0052   CHOLHDL 6.0 02/15/2020 0052   VLDL 19 02/15/2020 0052   LDLCALC 71 02/15/2020 0052   LDLCALC 55 09/15/2019 1446   HgbA1c:  Lab Results  Component Value Date   HGBA1C 5.9 (H) 02/15/2020   Urine Drug Screen:     Component Value Date/Time   LABOPIA NONE DETECTED 02/14/2020 2146   COCAINSCRNUR POSITIVE (A) 02/14/2020 2146   LABBENZ NONE DETECTED 02/14/2020 2146   AMPHETMU NONE DETECTED 02/14/2020 2146   THCU NONE DETECTED 02/14/2020 2146   LABBARB NONE DETECTED 02/14/2020 2146    Alcohol Level     Component Value  Date/Time   ETH 167 (H) 03/07/2014 0654    IMAGING   EEG  Result Date: 02/15/2020 Charlsie Quest, MD     02/15/2020  4:13 PM Patient Name: Tyler Vargas MRN: [id number] Epilepsy Attending: Charlsie Quest Referring Physician/Provider: Dr Georgiana Spinner Aroor Date: 02/15/2020 Duration: 23.26 mins Patient history: 33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 EEG to evaluate for seizure Level of alertness: asleep AEDs during EEG study: None Technical aspects: This EEG study was done with scalp electrodes positioned according to the 10-20 International system of electrode placement. Electrical activity was acquired at a sampling rate of  and reviewed with a high frequency filter of  and a low frequency filter of . EEG data were recorded continuously and digitally stored. DESCRIPTION:  Sleep was characterized by vertex waves, sleep spindles, maximal frontocentral. Hyperventilation and photic stimulation were not performed. IMPRESSION: This study during sleep only is within normal limits. No seizures or epileptiform discharges were seen throughout the recording. If suspicion for interictal  activity remains a concern, a prolonged study can be considered. Priyanka Annabelle Harman   CT ANGIO HEAD W OR WO CONTRAST  Result Date: 02/14/2020 CLINICAL DATA:  Stroke. Right-sided weakness and speech disturbance. EXAM: CT ANGIOGRAPHY HEAD AND NECK CT PERFUSION BRAIN TECHNIQUE: Multidetector CT imaging of the head and neck was performed using the standard protocol during bolus administration of intravenous contrast. Multiplanar CT image reconstructions and MIPs were obtained to evaluate the vascular anatomy. Carotid stenosis measurements (when applicable) are obtained  utilizing NASCET criteria, using the distal internal carotid diameter as the denominator. Multiphase CT imaging of the brain was performed following IV bolus contrast injection. Subsequent parametric perfusion maps were calculated using RAPID software. CONTRAST:  26mL OMNIPAQUE IOHEXOL 350 MG/ML SOLN; 10mL OMNIPAQUE IOHEXOL 350 MG/ML SOLN COMPARISON:  None. FINDINGS: CTA NECK FINDINGS Aortic arch: Standard 3 vessel aortic arch with widely patent arch vessel origins. Right carotid system: Patent and smooth without evidence of stenosis or dissection. Left carotid system: Patent and smooth without evidence of stenosis or dissection. Vertebral arteries: Patent, smooth, and codominant without evidence of stenosis or dissection. Skeleton: Poor dentition with multiple periapical erosions. Other neck: Borderline enlarged level II lymph nodes bilaterally. Upper chest: Centrilobular emphysema. Small superior mediastinal lymph nodes. Review of the MIP images confirms the above findings CTA HEAD FINDINGS Anterior circulation: The internal carotid arteries are widely patent from skull base to carotid termini. The A1 and M1 segments are widely patent with the left A1 segment being mildly hypoplastic. There is a decreased number of branch vessels in the right MCA superior division with suggestion of occlusion and intermittent reconstitution of distal M2 and M3 branches. There is also multifocal irregular narrowing of M3 branches in the right MCA inferior division, and there is mild left MCA branch vessel irregularity. No aneurysm is identified. Posterior circulation: The intracranial vertebral arteries are widely patent to the basilar. The basilar artery is widely patent. There are small posterior communicating arteries bilaterally. PCAs are patent with mild branch vessel irregularity more notable on the right without evidence of flow limiting proximal stenosis. No aneurysm is identified. Venous sinuses: Patent. Anatomic variants:  None. Review of the MIP images confirms the above findings CT Brain Perfusion Findings: ASPECTS: 4 CBF (<30%) Volume: 39mL Perfusion (Tmax>6.0s) volume: 58mL Mismatch Volume: 50mL, however the core infarct on noncontrast head CT is not being picked up by the automated processing and the true penumbra is less than 12 mL Infarction Location: Right MCA IMPRESSION: 1. Multiple missing right MCA branch vessels in the superior division with wide patency of the M1 segment and no discrete proximal M2 occlusion identified. 2. Widely patent common carotid, internal carotid, and vertebral arteries. 3. CT perfusion demonstrating at most a small penumbra as detailed above. These results were called by telephone at the time of interpretation on 02/14/2020 at 7:55 p.m. to Dr. Laurence Slate, who verbally acknowledged these results. Electronically Signed   By: Sebastian Ache M.D.   On: 02/14/2020 20:22   CT HEAD WO CONTRAST  Result Date: 02/15/2020 CLINICAL DATA:  Stroke follow-up. COVID-19 infection. History of HIV. EXAM: CT HEAD WITHOUT CONTRAST TECHNIQUE: Contiguous axial images were obtained from the base of the skull through the vertex without intravenous contrast. COMPARISON:  Head MRI 02/15/2020 FINDINGS: Brain: Acute right MCA infarcts involving the basal ganglia, internal capsule, insula, and frontal operculum are unchanged as are small acute left basal ganglia and left internal capsule infarcts. A subcentimeter focus of hemorrhage in the right frontal operculum is  unchanged. No new infarct, new intracranial hemorrhage, midline shift, or extra-axial fluid collection is identified. Mild ventricular prominence for age is unchanged and likely reflective of mild cerebral atrophy. Vascular: Hyperdense right MCA branch vessels in the sylvian fissure. Skull: No fracture or suspicious osseous lesion. Sinuses/Orbits: Mild right sphenoid sinus mucosal thickening. Small right maxillary sinus mucous retention cyst. Clear mastoid air cells.  Unremarkable orbits. Other: None. IMPRESSION: 1. Unchanged acute right MCA infarcts with unchanged small hemorrhage in the right frontal operculum. 2. Unchanged small acute left basal ganglia and internal capsule infarcts. 3. No new intracranial abnormality. Electronically Signed   By: Sebastian Ache M.D.   On: 02/15/2020 19:40   CT ANGIO NECK W OR WO CONTRAST  Result Date: 02/14/2020 CLINICAL DATA:  Stroke. Right-sided weakness and speech disturbance. EXAM: CT ANGIOGRAPHY HEAD AND NECK CT PERFUSION BRAIN TECHNIQUE: Multidetector CT imaging of the head and neck was performed using the standard protocol during bolus administration of intravenous contrast. Multiplanar CT image reconstructions and MIPs were obtained to evaluate the vascular anatomy. Carotid stenosis measurements (when applicable) are obtained utilizing NASCET criteria, using the distal internal carotid diameter as the denominator. Multiphase CT imaging of the brain was performed following IV bolus contrast injection. Subsequent parametric perfusion maps were calculated using RAPID software. CONTRAST:  40mL OMNIPAQUE IOHEXOL 350 MG/ML SOLN; 75mL OMNIPAQUE IOHEXOL 350 MG/ML SOLN COMPARISON:  None. FINDINGS: CTA NECK FINDINGS Aortic arch: Standard 3 vessel aortic arch with widely patent arch vessel origins. Right carotid system: Patent and smooth without evidence of stenosis or dissection. Left carotid system: Patent and smooth without evidence of stenosis or dissection. Vertebral arteries: Patent, smooth, and codominant without evidence of stenosis or dissection. Skeleton: Poor dentition with multiple periapical erosions. Other neck: Borderline enlarged level II lymph nodes bilaterally. Upper chest: Centrilobular emphysema. Small superior mediastinal lymph nodes. Review of the MIP images confirms the above findings CTA HEAD FINDINGS Anterior circulation: The internal carotid arteries are widely patent from skull base to carotid termini. The A1 and M1  segments are widely patent with the left A1 segment being mildly hypoplastic. There is a decreased number of branch vessels in the right MCA superior division with suggestion of occlusion and intermittent reconstitution of distal M2 and M3 branches. There is also multifocal irregular narrowing of M3 branches in the right MCA inferior division, and there is mild left MCA branch vessel irregularity. No aneurysm is identified. Posterior circulation: The intracranial vertebral arteries are widely patent to the basilar. The basilar artery is widely patent. There are small posterior communicating arteries bilaterally. PCAs are patent with mild branch vessel irregularity more notable on the right without evidence of flow limiting proximal stenosis. No aneurysm is identified. Venous sinuses: Patent. Anatomic variants: None. Review of the MIP images confirms the above findings CT Brain Perfusion Findings: ASPECTS: 4 CBF (<30%) Volume: 0mL Perfusion (Tmax>6.0s) volume: 12mL Mismatch Volume: 12mL, however the core infarct on noncontrast head CT is not being picked up by the automated processing and the true penumbra is less than 12 mL Infarction Location: Right MCA IMPRESSION: 1. Multiple missing right MCA branch vessels in the superior division with wide patency of the M1 segment and no discrete proximal M2 occlusion identified. 2. Widely patent common carotid, internal carotid, and vertebral arteries. 3. CT perfusion demonstrating at most a small penumbra as detailed above. These results were called by telephone at the time of interpretation on 02/14/2020 at 7:55 p.m. to Dr. Laurence Slate, who verbally acknowledged these results. Electronically Signed  By: Logan Bores M.D.   On: 02/14/2020 20:22   MR BRAIN WO CONTRAST  Result Date: 02/15/2020 CLINICAL DATA:  Stroke.  HIV. EXAM: MRI HEAD WITHOUT CONTRAST TECHNIQUE: Multiplanar, multiecho pulse sequences of the brain and surrounding structures were obtained without intravenous  contrast. COMPARISON:  CT head 02/14/2020 FINDINGS: Brain: Acute right MCA infarct involving the right lenticular nucleus as well as the right frontal operculum and right parietal operculum. Small area of acute infarction in the genu internal capsule on the right. Small area of associated hemorrhage in the right frontal operculum and right parietal operculum. Small areas of acute infarct in the left basal ganglia involving the genu internal capsule and posterior external capsule fibers. Ventricle size normal.  No midline shift.  Negative for mass lesion. Vascular: Normal arterial flow voids. Skull and upper cervical spine: No focal skeletal lesion. Sinuses/Orbits: Mild mucosal edema paranasal sinuses. Negative orbit Other: None IMPRESSION: Acute infarct right MCA territory involving the basal ganglia as well as the right frontal and parietal operculum where there are small areas of associated hemorrhage. Small area of acute infarct in the left internal capsule and external capsule. Electronically Signed   By: Franchot Gallo M.D.   On: 02/15/2020 07:27   CT CEREBRAL PERFUSION W CONTRAST  Result Date: 02/14/2020 CLINICAL DATA:  Stroke. Right-sided weakness and speech disturbance. EXAM: CT ANGIOGRAPHY HEAD AND NECK CT PERFUSION BRAIN TECHNIQUE: Multidetector CT imaging of the head and neck was performed using the standard protocol during bolus administration of intravenous contrast. Multiplanar CT image reconstructions and MIPs were obtained to evaluate the vascular anatomy. Carotid stenosis measurements (when applicable) are obtained utilizing NASCET criteria, using the distal internal carotid diameter as the denominator. Multiphase CT imaging of the brain was performed following IV bolus contrast injection. Subsequent parametric perfusion maps were calculated using RAPID software. CONTRAST:  24mL OMNIPAQUE IOHEXOL 350 MG/ML SOLN; 24mL OMNIPAQUE IOHEXOL 350 MG/ML SOLN COMPARISON:  None. FINDINGS: CTA NECK FINDINGS  Aortic arch: Standard 3 vessel aortic arch with widely patent arch vessel origins. Right carotid system: Patent and smooth without evidence of stenosis or dissection. Left carotid system: Patent and smooth without evidence of stenosis or dissection. Vertebral arteries: Patent, smooth, and codominant without evidence of stenosis or dissection. Skeleton: Poor dentition with multiple periapical erosions. Other neck: Borderline enlarged level II lymph nodes bilaterally. Upper chest: Centrilobular emphysema. Small superior mediastinal lymph nodes. Review of the MIP images confirms the above findings CTA HEAD FINDINGS Anterior circulation: The internal carotid arteries are widely patent from skull base to carotid termini. The A1 and M1 segments are widely patent with the left A1 segment being mildly hypoplastic. There is a decreased number of branch vessels in the right MCA superior division with suggestion of occlusion and intermittent reconstitution of distal M2 and M3 branches. There is also multifocal irregular narrowing of M3 branches in the right MCA inferior division, and there is mild left MCA branch vessel irregularity. No aneurysm is identified. Posterior circulation: The intracranial vertebral arteries are widely patent to the basilar. The basilar artery is widely patent. There are small posterior communicating arteries bilaterally. PCAs are patent with mild branch vessel irregularity more notable on the right without evidence of flow limiting proximal stenosis. No aneurysm is identified. Venous sinuses: Patent. Anatomic variants: None. Review of the MIP images confirms the above findings CT Brain Perfusion Findings: ASPECTS: 4 CBF (<30%) Volume: 38mL Perfusion (Tmax>6.0s) volume: 68mL Mismatch Volume: 47mL, however the core infarct on noncontrast head CT is  not being picked up by the automated processing and the true penumbra is less than 12 mL Infarction Location: Right MCA IMPRESSION: 1. Multiple missing  right MCA branch vessels in the superior division with wide patency of the M1 segment and no discrete proximal M2 occlusion identified. 2. Widely patent common carotid, internal carotid, and vertebral arteries. 3. CT perfusion demonstrating at most a small penumbra as detailed above. These results were called by telephone at the time of interpretation on 02/14/2020 at 7:55 p.m. to Dr. Laurence Slate, who verbally acknowledged these results. Electronically Signed   By: Sebastian Ache M.D.   On: 02/14/2020 20:22   DG Chest Port 1 View  Result Date: 02/14/2020 CLINICAL DATA:  Acute CVA. Evaluate for pneumonia. EXAM: PORTABLE CHEST 1 VIEW COMPARISON:  Radiograph 07/07/2019. Lung apices from CT angiography earlier today. FINDINGS: Ill-defined opacity in the right mid lung abutting the minor fissure. Mild cardiomegaly. Normal mediastinal contours. No pulmonary edema, large pleural effusion or pneumothorax. No acute osseous abnormalities are seen. IMPRESSION: Ill-defined opacity in the right mid lung abutting the minor fissure, suspicious for pneumonia. Mild cardiomegaly. Electronically Signed   By: Narda Rutherford M.D.   On: 02/14/2020 21:58   DG Abd Portable 1V  Result Date: 02/15/2020 CLINICAL DATA:  MRI clearance EXAM: PORTABLE ABDOMEN - 1 VIEW COMPARISON:  None. FINDINGS: The bowel gas pattern is normal. No radio-opaque calculi or other significant radiographic abnormality are seen. IMPRESSION: No radial pain metallic foreign body in the abdomen or pelvis. Electronically Signed   By: Elige Ko   On: 02/15/2020 05:57   CT HEAD CODE STROKE WO CONTRAST  Result Date: 02/14/2020 CLINICAL DATA:  Code stroke.  Right-sided weakness. EXAM: CT HEAD WITHOUT CONTRAST TECHNIQUE: Contiguous axial images were obtained from the base of the skull through the vertex without intravenous contrast. COMPARISON:  03/07/2014 FINDINGS: Brain: There is an acute right basal ganglia infarct involving the caudate nucleus, lentiform nucleus,  and a portion of the anterior limb of the right internal capsule. There is a subcentimeter focus of acute hemorrhage superficially in the right frontal operculum with a small amount of surrounding low-density, and there is also loss of gray-white differentiation indicative of an acute infarct in the anterior aspect of the right insula and more anterior aspect of the right frontal lobe. There is a new age indeterminate lacunar infarct in the genu of the left internal capsule. The ventricles are normal in size. There is no midline shift or extra-axial fluid collection. Vascular: Hyperdense right MCA. Skull: No fracture or suspicious osseous lesion. Sinuses/Orbits: Partially visualized small mucous retention cyst in the right maxillary sinus. Clear mastoid air cells. Rightward gaze. Other: None. ASPECTS [facility] Stroke Program Early CT Score) - Ganglionic level infarction (caudate, lentiform nuclei, internal capsule, insula, M1-M3 cortex): 1 - Supraganglionic infarction (M4-M6 cortex): 3 Total score (0-10 with 10 being normal): 4 IMPRESSION: 1. 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 2. ASPECTS is 4. 3. 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 These results were communicated to Dr. Laurence Slate at 7:29 pmon 02/14/2020 by text page via the [facility] messaging system. Electronically Signed   By: Sebastian Ache M.D.   On: 02/14/2020 19:30   ECHOCARDIOGRAM LIMITED  Result Date: 02/15/2020    ECHOCARDIOGRAM LIMITED REPORT   Patient Name:   REUVEN BRAVER Date of Exam: 02/15/2020 Medical Rec #:  [id number]  Height:       68.0 in Accession #:    [id number]     Weight:       152.1 lb Date of Birth:  10-06-1986       BSA:          1.819 m Patient Age:    33 years       BP:           139/86 mmHg Patient Gender: M              HR:           78 bpm. Exam Location:  Inpatient Procedure: Limited Echo, Limited Color  Doppler and Cardiac Doppler Indications:    Stroke 434.91 / I163.9                 Endocarditis I38  History:        Patient has no prior history of Echocardiogram examinations.                 HIV.  Sonographer:    Leta Jungling RDCS Referring Phys: [id number] TIMOTHY S OPYD IMPRESSIONS  1. Left ventricular ejection fraction, by estimation, is 55 to 60%. The left ventricle has normal function. The left ventricle has no regional wall motion abnormalities. Left ventricular diastolic parameters were normal.  2. The mitral valve is grossly normal. Trivial mitral valve regurgitation. No evidence of mitral stenosis.  3. The aortic valve is tricuspid. Aortic valve regurgitation is not visualized. No aortic stenosis is present.  4. There is normal pulmonary artery systolic pressure. The estimated right ventricular systolic pressure is 24.0 mmHg.  5. The inferior vena cava is normal in size with greater than 50% respiratory variability, suggesting right atrial pressure of 3 mmHg. Conclusion(s)/Recommendation(s): No evidence of valvular vegetations on this transthoracic echocardiogram. Would recommend a transesophageal echocardiogram to exclude infective endocarditis if clinically indicated. FINDINGS  Left Ventricle: Left ventricular ejection fraction, by estimation, is 55 to 60%. The left ventricle has normal function. The left ventricle has no regional wall motion abnormalities. The left ventricular internal cavity size was normal in size. There is  no left ventricular hypertrophy. Right Ventricle: There is normal pulmonary artery systolic pressure. The tricuspid regurgitant velocity is 2.29 m/s, and with an assumed right atrial pressure of 3 mmHg, the estimated right ventricular systolic pressure is 24.0 mmHg. Left Atrium: Left atrial size was normal in size. Right Atrium: Right atrial size was normal in size. Pericardium: A small pericardial effusion is present. The pericardial effusion is circumferential. Mitral Valve:  The mitral valve is grossly normal. Trivial mitral valve regurgitation. No evidence of mitral valve stenosis. There is no evidence of mitral valve vegetation. Tricuspid Valve: The tricuspid valve is grossly normal. Tricuspid valve regurgitation is trivial. No evidence of tricuspid stenosis. There is no evidence of tricuspid valve vegetation. Aortic Valve: The aortic valve is tricuspid. Aortic valve regurgitation is not visualized. No aortic stenosis is present. There is no evidence of aortic valve vegetation. Pulmonic Valve: The pulmonic valve was grossly normal. Pulmonic valve regurgitation is trivial. No evidence of pulmonic stenosis. There is no evidence of pulmonic valve vegetation. Venous: The inferior vena cava is normal in size with greater than 50% respiratory variability, suggesting right atrial pressure of 3 mmHg. IAS/Shunts: The atrial septum is grossly normal.  LEFT VENTRICLE PLAX 2D LVIDd:         5.00 cm      Diastology LVIDs:  3.50 cm      LV e' lateral:   12.70 cm/s LV PW:         1.00 cm      LV E/e' lateral: 5.0 LV IVS:        1.00 cm      LV e' medial:    12.50 cm/s                             LV E/e' medial:  5.1  LV Volumes (MOD) LV vol d, MOD A2C: 151.0 ml LV vol d, MOD A4C: 177.0 ml LV vol s, MOD A2C: 73.1 ml LV vol s, MOD A4C: 68.9 ml LV SV MOD A2C:     77.9 ml LV SV MOD A4C:     177.0 ml LV SV MOD BP:      94.8 ml LEFT ATRIUM             Index       RIGHT ATRIUM           Index LA diam:        3.30 cm 1.81 cm/m  RA Area:     14.30 cm LA Vol (A2C):   54.3 ml 29.85 ml/m RA Volume:   36.30 ml  19.95 ml/m LA Vol (A4C):   44.8 ml 24.62 ml/m LA Biplane Vol: 53.9 ml 29.63 ml/m  AORTIC VALVE LVOT Vmax:   104.00 cm/s LVOT Vmean:  72.900 cm/s LVOT VTI:    0.168 m  AORTA Ao Root diam: 3.50 cm MITRAL VALVE               TRICUSPID VALVE MV Area (PHT): 2.66 cm    TR Peak grad:   21.0 mmHg MV Decel Time: 285 msec    TR Vmax:        229.00 cm/s MV E velocity: 63.40 cm/s MV A velocity: 75.80  cm/s  SHUNTS MV E/A ratio:  0.84        Systemic VTI: 0.17 m Lennie Odor MD Electronically signed by Lennie Odor MD Signature Date/Time: 02/15/2020/1:27:05 PM    Final      Transthoracic Echocardiogram  00/00/2021 Pending    Bilateral Carotid Dopplers  00/00/2021 Pending   ECG - NSR. (See cardiology reading for complete details)      PHYSICAL EXAM Blood pressure 120/77, pulse 69, temperature 97.8 F (36.6 C), temperature source Oral, resp. rate 19, height  (1.727 m), weight 69 kg, SpO2 100 %. Young [place] male not in distress. . Afebrile. Head is nontraumatic. Neck is supple without bruit.    Cardiac exam no murmur or gallop. Lungs are clear to auscultation. Distal pulses are well felt. Neurological Exam ; Patient is drowsy but does arouse and opens eyes to tactile and noxious stimulus.  Patient is nonverbal and does not speak of see any sentences but will follow occasional commands on the left side.  Eyes in primary position.  Blinks to threat bilaterally.  Mild right lower facial weakness.  Tongue midline.  Motor system exam patient is not very cooperative but does move left upper extremity purposefully against gravity and withdraws both lower extremities slightly.  Does not move the right upper extremity as well as left upper extremity.  Gait not tested    HOME MEDICATIONS:  Medications Prior to Admission  Medication Sig Dispense Refill  . bictegravir-emtricitabine-tenofovir AF (BIKTARVY) 50-200-25 MG TABS tablet Take 1 tablet by mouth daily. Try  to take at the same time each day with or without food. 30 tablet 5  . ibuprofen (ADVIL) 200 MG tablet Take 200-400 mg by mouth every 6 (six) hours as needed for headache (pain).    . naproxen sodium (ALEVE) 220 MG tablet Take 220-440 mg by mouth 2 (two) times daily as needed (pain/headache).    . sulfamethoxazole-trimethoprim (BACTRIM DS) 800-160 MG tablet Take 1 tablet by mouth daily. 30 tablet 5      HOSPITAL  MEDICATIONS:  .  stroke: mapping our early stages of recovery book   Does not apply Once  . bictegravir-emtricitabine-tenofovir AF  1 tablet Oral Daily  . dexamethasone (DECADRON) injection  6 mg Intravenous Q24H  . enoxaparin (LOVENOX) injection  1 mg/kg Subcutaneous Q12H  . [START ON 02/17/2020] influenza vac split quadrivalent PF  0.5 mL Intramuscular Tomorrow-1000  . [START ON 02/17/2020] pneumococcal 23 valent vaccine  0.5 mL Intramuscular Tomorrow-1000  . sulfamethoxazole-trimethoprim  1 tablet Oral Daily    ALLERGIES No Known Allergies  PAST MEDICAL HISTORY Past Medical History:  Diagnosis Date  . History of syphilis 09/30/2019  . HIV (human immunodeficiency virus infection) (HCC)   . Male-to-male transgender person     SURGICAL HISTORY Past Surgical History:  Procedure Laterality Date  . APPENDECTOMY      FAMILY HISTORY Family History  Problem Relation Age of Onset  . Cancer Neg Hx   . Diabetes Neg Hx     SOCIAL HISTORY  reports that she has been smoking cigarettes. She has been smoking about 0.25 packs per day. She has never used smokeless tobacco. She reports current alcohol use. She reports current drug use. Drugs: Marijuana and Cocaine.  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    Code Stroke CT Head -      CT head   MRI head  MRA head   CTA H&N   CT Perfusion  Carotid Doppler - not needed  2D Echo -normal ejection fraction.  No cardiac source of embolism.  Sars Corona Virus 2  Positive.    LDL - 71    Component Value Date/Time   LDLCALC 71 02/15/2020 0052   LDLCALC 55 09/15/2019 1446    HgbA1c - 5.9  UDS positive for cocaine  VTE prophylaxis - SCDs Diet  Diet Order            Diet NPO time  specified  Diet effective now              Hypertension Home BP meds: none   Hyperlipidemia Home Lipid lowering medication: none    Hospital day # 2  Patient presented with altered mental status secondary to a septic meningitis with Covid positive state, HIV and AIDS and history of neurosyphilis.  [facility] for strokes is unclear but hypercoagulability from Covid or untreated neurosyphilis vasculitis may be responsible.  Transcranial Doppler bubble study performed at the bedside was negative for PFO.  2D echo was unremarkable.  Recommend check CSF VDRL and if positive may need to treat for neurosyphilis as well.  Appreciate ID follow-up.  Continue aspirin and aggressive risk factor modification.  Continue treatment for Covid as per primary team and HIV as per ID.  Greater than 50% time during this 35-minute visit was spent on counseling and coordination  of care about his multiple strokes and discussion with care team.  Stroke team will sign off.  Kindly call for questions if any.  Discussed with Dr. Ella Jubilee. Delia Heady, MD To contact Stroke Continuity provider, please refer to [email or web address]. After hours, contact General Neurology

## 2020-02-17 DIAGNOSIS — Z21 Asymptomatic human immunodeficiency virus [HIV] infection status: Secondary | ICD-10-CM

## 2020-02-17 DIAGNOSIS — A872 Lymphocytic choriomeningitis: Secondary | ICD-10-CM

## 2020-02-17 LAB — COMPREHENSIVE METABOLIC PANEL
ALT: 11 U/L (ref 0–44)
AST: 22 U/L (ref 15–41)
Albumin: 2 g/dL — ABNORMAL LOW (ref 3.5–5.0)
Alkaline Phosphatase: 48 U/L (ref 38–126)
Anion gap: 14 (ref 5–15)
BUN: 10 mg/dL (ref 6–20)
CO2: 23 mmol/L (ref 22–32)
Calcium: 9 mg/dL (ref 8.9–10.3)
Chloride: 99 mmol/L (ref 98–111)
Creatinine, Ser: 0.78 mg/dL (ref 0.61–1.24)
GFR calc Af Amer: >60 mL/min (ref >60)
GFR calc non Af Amer: >60 mL/min (ref >60)
Glucose, Bld: 102 mg/dL — ABNORMAL HIGH (ref 70–99)
Potassium: 4.5 mmol/L (ref 3.5–5.1)
Sodium: 136 mmol/L (ref 135–145)
Total Bilirubin: 0.3 mg/dL (ref 0.3–1.2)
Total Protein: 8.3 g/dL — ABNORMAL HIGH (ref 6.5–8.1)

## 2020-02-17 LAB — C-REACTIVE PROTEIN: CRP: 3.5 mg/dL — ABNORMAL HIGH (ref <1.0)

## 2020-02-17 LAB — D-DIMER, QUANTITATIVE: D-Dimer, Quant: 2.66 ug/mL-FEU — ABNORMAL HIGH (ref 0.00–0.50)

## 2020-02-17 LAB — FERRITIN: Ferritin: 830 ng/mL — ABNORMAL HIGH (ref 24–336)

## 2020-02-17 MED ORDER — ASPIRIN 300 MG RE SUPP
300.0000 mg | Freq: Every day | RECTAL | Status: DC
  Administered 2020-02-17 – 2020-02-18 (×2): 300 mg via RECTAL
  Filled 2020-02-17 (×2): qty 1

## 2020-02-17 MED ORDER — BIKTARVY 50-200-25 MG PO TABS: 1.0000 | ORAL_TABLET | Freq: Every day | ORAL | 1 refills | Status: AC

## 2020-02-17 NOTE — Evaluation (Signed)
Speech Language Pathology Evaluation Patient Details Name: Tyler Vargas MRN: [id number] DOB: Jun 20, 1986 Today's Date: 02/17/2020 Time: 7124-5809 SLP Time Calculation (min) (ACUTE ONLY): 12 min  Problem List:  Patient Active Problem List   Diagnosis Date Noted  . COVID-19 02/16/2020  . Acute CVA (cerebrovascular accident) (HCC) 02/14/2020  . Cocaine use disorder, mild, abuse (HCC) 01/29/2020  . Psoriasis 09/30/2019  . 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 HIV (human immunodeficiency virus infection) (HCC) 09/30/2019  . AIDS (acquired immune deficiency syndrome) (HCC) 09/30/2019  . History of syphilis 09/30/2019  . Male-to-male transgender person 09/30/2019  . Depression 09/30/2019   Past Medical History:  Past Medical History:  Diagnosis Date  . History of syphilis 09/30/2019  . HIV (human immunodeficiency virus infection) (HCC)   . Male-to-male transgender person    Past Surgical History:  Past Surgical History:  Procedure Laterality Date  . APPENDECTOMY     HPI:  34 y.o. adult with medical history significant for HIV/AIDS with CD4 count 84 and viral load 1.8 million earlier this month, latent syphilis, and cocaine abuse, now presenting to the emergency department with weakness. Pt aphasic and initially not following commands in ED, eventually able to follow commands but not provide history.  CT head is concerning for acute right MCA infarctions involving the frontal lobe, insula, and basal ganglia with small focus of hemorrhage in the right frontal operculum.  Also noted on CT is a new age-indeterminate lacunar infarction in the left internal capsule. Pt also found to be COVID +. Admitted 3/27 for treatment of acute CVA.   Assessment / Plan / Recommendation Clinical Impression  Pt was seen for a cognitive-linguistic evaluation in the setting of acute R MCA infarcts.  She presents with significant language deficits and suspected cognitive deficits.  Pt was awake/alert throughout this  evaluation; however, she exhibited limited eye contact and she did not attempt to verbalize during this session.  Pt was unable to follow 1-step commands upon request and she was not able to answer yes/no questions via head nod or hand squeeze.  It appeared that pt was able to answer some yes/no questions via eye blink, but her responses were inconsistent.  Pt was able to track the SLP on the left and right side of her bed given verbal cues.  Recommend SLP f/u for diagnostic treatment.      SLP Assessment  SLP Recommendation/Assessment: Patient needs continued Speech Lanaguage Pathology Services SLP Visit Diagnosis: Cognitive communication deficit (R41.841)    Follow Up Recommendations  Skilled Nursing facility    Frequency and Duration min 2x/week  2 weeks      SLP Evaluation Cognition  Overall Cognitive Status: Difficult to assess Arousal/Alertness: Awake/alert Orientation Level: Oriented to person       Comprehension       Expression Verbal Expression Non-Verbal Means of Communication: Eye blink   Oral / Motor  Oral Motor/Sensory Function Overall Oral Motor/Sensory Function: Other (comment)(Unable to evaluate )   GO                   Tyler Vargas., M.S., CCC-SLP Acute Rehabilitation Services Office: [phone]  Tyler Vargas Tyler Vargas 02/17/2020, 1:21 PM

## 2020-02-17 NOTE — Evaluation (Signed)
Clinical/Bedside Swallow Evaluation Patient Details  Name: Tyler Vargas MRN: [id number] Date of Birth: September 15, 1986  Today's Date: 02/17/2020 Time: SLP Start Time (ACUTE ONLY): 1150 SLP Stop Time (ACUTE ONLY): 1202 SLP Time Calculation (min) (ACUTE ONLY): 12 min  Past Medical History:  Past Medical History:  Diagnosis Date  . History of syphilis 09/30/2019  . HIV (human immunodeficiency virus infection) (HCC)   . Male-to-male transgender person    Past Surgical History:  Past Surgical History:  Procedure Laterality Date  . APPENDECTOMY     HPI:  34 y.o. adult with medical history significant for HIV/AIDS with CD4 count 84 and viral load 1.8 million earlier this month, latent syphilis, and cocaine abuse, now presenting to the emergency department with weakness. Pt aphasic and initially not following commands in ED, eventually able to follow commands but not provide history.  CT head is concerning for acute right MCA infarctions involving the frontal lobe, insula, and basal ganglia with small focus of hemorrhage in the right frontal operculum.  Also noted on CT is a new age-indeterminate lacunar infarction in the left internal capsule. Pt also found to be COVID +. Admitted 3/27 for treatment of acute CVA.     Assessment / Plan / Recommendation Clinical Impression  Pt was seen for a bedside swallow evaluation in the setting of acute R MCA infarcts.  Pt was encountered awake/alert in bed and she did not attempt to verbalize throughout this evaluation.  Attempted oral mechanism exam; however, pt was unable to follow commands.  She appeared to have adequate natural dentition to limited observation.  Ice chip and thin liquid via tsp and siphoned straw were introduced to the pt and she accepted them passively into her oral cavity with decreased labial opening.  Spontaneous lingual and labial movement was observed prior to PO trials; however, pt made no attempts at lingual manipulation or swallow  initiation with any PO trials.  R anterior labial spillage was observed with the ice chip trial and pt held the thin liquid trials in her anterior sulci.  All boluses were eventually suctioned from the pt's oral cavity secondary to concerns for aspiration.  Recommend continuation of NPO with frequent oral care and consideration of short-term alternative means of nutrition.  SLP will f/u to determine readiness for diet initiation.    SLP Visit Diagnosis: Dysphagia, oral phase (R13.11)    Aspiration Risk  Moderate aspiration risk    Diet Recommendation NPO;Alternative means - temporary   Medication Administration: Via alternative means    Other  Recommendations Oral Care Recommendations: Oral care QID;Staff/trained caregiver to provide oral care   Follow up Recommendations Skilled Nursing facility      Frequency and Duration min 2x/week  2 weeks       Prognosis Prognosis for Safe Diet Advancement: Fair Barriers to Reach Goals: Cognitive deficits;Language deficits      Swallow Study   General HPI: 34 y.o. adult with medical history significant for HIV/AIDS with CD4 count 84 and viral load 1.8 million earlier this month, latent syphilis, and cocaine abuse, now presenting to the emergency department with weakness. Pt aphasic and initially not following commands in ED, eventually able to follow commands but not provide history.  CT head is concerning for acute right MCA infarctions involving the frontal lobe, insula, and basal ganglia with small focus of hemorrhage in the right frontal operculum.  Also noted on CT is a new age-indeterminate lacunar infarction in the left internal capsule. Pt also found to be  COVID +. Admitted 3/27 for treatment of acute CVA. Type of Study: Bedside Swallow Evaluation Previous Swallow Assessment: None  Diet Prior to this Study: NPO Temperature Spikes Noted: No Respiratory Status: Room air History of Recent Intubation: No Behavior/Cognition: Alert;Doesn't  follow directions Oral Cavity Assessment: Within Functional Limits Oral Care Completed by SLP: No Oral Cavity - Dentition: Adequate natural dentition Patient Positioning: Upright in bed Baseline Vocal Quality: Not observed Volitional Swallow: Unable to elicit    Oral/Motor/Sensory Function Overall Oral Motor/Sensory Function: Other (comment)(Unable to evaluate )   Ice Chips Ice chips: Impaired Presentation: Spoon Oral Phase Impairments: Reduced labial seal;Poor awareness of bolus Oral Phase Functional Implications: Right anterior spillage;Oral holding Pharyngeal Phase Impairments: Unable to trigger swallow   Thin Liquid Thin Liquid: Impaired Presentation: Straw Oral Phase Impairments: Poor awareness of bolus;Reduced labial seal Oral Phase Functional Implications: Right lateral sulci pocketing;Oral holding Pharyngeal  Phase Impairments: Unable to trigger swallow    Nectar Thick Nectar Thick Liquid: Not tested   Honey Thick Honey Thick Liquid: Not tested   Puree Puree: Not tested   Solid     Solid: Not tested     Colin Mulders M.S., CCC-SLP Acute Rehabilitation Services Office: [phone]  [place] 02/17/2020,1:13 PM

## 2020-02-17 NOTE — Progress Notes (Signed)
CSW acknowledges substance use consult. Patient not currently appropriate for resource assessment. Also acknowledging SNF consult. Patient will have multiple barriers to placement including lack of insurance and substance use. CSW will continue to follow.   Osborne Casco Tamani Durney LCSW [phone]

## 2020-02-17 NOTE — Progress Notes (Addendum)
PROGRESS NOTE    Tyler Vargas  ZOX:[id number] DOB: [birth year] DOA: 02/14/2020 PCP: Patient, No Pcp Per    Brief Narrative:  Patient was admitted to the hospital with a working diagnosis of acute right MCA infarct, ischemic/hemorrhagic, in the setting ofacuteSARS COVID-19 viral pneumonia.  34 year old male who presented with weakness. He does have significant past medical history of HIV/AIDS, CD4 count 84, viral load 1.8, latent syphilis, and cocaine abuse. EMS was called due to generalized weakness for about 2 days. In the emergency department he cannot give any history due to altered mental status.His blood pressure was 142/115, heart rate 90, respiratory 22, oxygen saturation 100%, he was aphasic, strength 4 out of 5 on the right side, not fully following commands, his lungs are clear to auscultation bilaterally, heart S1-S2 present rhythmic, soft abdomen, no lower extremity edema.  Sodium 132, potassium 3.7, chloride 97, bicarb 23, glucose 114, BUN 9, creatinine 0.86, white count 11.4, hemoglobin 10.6, hematocrit 35.0, platelets 561.SARS COVID-19 was positive, urinalysis specific gravity more than 1.046, 0-5 white cells,lumbar puncture, glucose 42, white cells 39, lymphocytes 52, protein 72, drug screen positive for cocaine.   Head CT with acute right MCA infarct involving the frontal lobe, insula and basal ganglia with small focus of hemorrhage in the right frontal operculum. Head and neck angiography with multiple missing right MCA branch vessels in the superior division.His chest x-ray had bilateral interstitial infiltrates at bases.  Further work-up with brain MRI confirmed acute infarct right MCA territory involving the basal ganglia as well as right frontal and parietal operculum, positive small areas of associated hemorrhage.  Patient will continue aspirin per neurology recommendations, plan to continue COVID 19 pneumonia treatment, will need SNF at discharge.    Penicillin and vancomycin have been discontinued per ID recommendations, no signs of bacterial meningitis or syphilis.  Assessment & Plan:   Principal Problem:   Acute CVA (cerebrovascular accident) (HCC) Active Problems:   AIDS (acquired immune deficiency syndrome) (HCC)   History of syphilis   Cocaine use disorder, mild, abuse (HCC)   COVID-19   1. Acute right MCA infarct. possible increase thrombosis due to covid 19 viral infection. Persistent aphasia and right sided weakness,  EEG with no seizure activity. Echocardiogram with no signs of thrombosis, LV function is preserved.   Patient continue to be NPO per speech recommendations. Case discussed with Neurology, no further work up at this point, will continue aspirin for now. Will continue IV fluids for now with dextrose.   Neuro checks and aspiration precautions. Patient will need snf.   2. SARS COVID 19 viral pneumonia.  RR: 16  Pulse oxymetry: 96%  Fi02: 21% room air  COVID-19 Labs  Recent Labs    02/16/20 0828 02/17/20 0313 02/17/20 0550  DDIMER 3.32* 2.66*  --   FERRITIN 863*  --  830*  CRP 5.7*  --  3.5*    Lab Results  Component Value Date   SARSCOV2NAA POSITIVE (A) 02/14/2020   Inflammatory markers trending down, will continue medical therapy with remdesivir #3/5 and systemic steroids with dexemethasone.  Patient with severe immunosuppression at high risk for worsening viral pneumonia.   3. HIV/AIDS/ latent syphilis (CD 4 81/ viral load 1,810,000) . Ordered antiretroviral therapy but patient with no po intake, not getting the medication.   Prophylaxis with bactrim   4. Cocaine use. Patient tested positive for cocaine on admission, clinically no signs of withdrawal, will continue close neuro checks per unit protocol.   DVT prophylaxis:enoxaparin Code  Status:full Family Communication:I spoke over the phone with the patient's father yesterday about patient's  condition, plan of care,  prognosis and all questions were addressed. Disposition Plan/ discharge patient from home, barrier for discharge acute cva and covid infection, will need further inpatient care and plan for dc to snf.    Consultants:   ID  Neurology      Subjective: Patient continue to be poorly reactive and non verbal, he dose follow commands with his legs, but today not with his arms. No apparent pain or dyspnea. Pending speech therapy evaluation.   Objective: Vitals:   02/16/20 2000 02/16/20 2355 02/17/20 0400 02/17/20 0736  BP: 109/88 112/82 111/76 103/68  Pulse: 65  (!) 57 (!) 57  Resp: 16 16 16 16   Temp: (!) 97.4 F (36.3 C) 97.6 F (36.4 C)  97.8 F (36.6 C)  TempSrc: Axillary Axillary  Oral  SpO2:   98% 96%  Weight:      Height:        Intake/Output Summary (Last 24 hours) at 02/17/2020 0816 Last data filed at 02/16/2020 1700 Gross per 24 hour  Intake 100 ml  Output 350 ml  Net -250 ml   Filed Weights   02/14/20 2205 02/15/20 0157 02/15/20 0450  Weight: 65.9 kg 66.9 kg 69 kg    Examination:   General: Not in pain or dyspnea, deconditioned  Neurology: right facial droop and right upper extremity paresis, able to move lower extremities and left upper extremity.  E ENT: positive pallor, no icterus, oral mucosa dry Cardiovascular: No JVD. S1-S2 present, rhythmic, no gallops, rubs, or murmurs. No lower extremity edema. Pulmonary: positive breath sounds bilaterally, adequate air movement, no wheezing, rhonchi or rales. Gastrointestinal. Abdomen with, no organomegaly, non tender, no rebound or guarding Skin. No rashes Musculoskeletal: no joint deformities     Data Reviewed: I have personally reviewed following labs and imaging studies  CBC: Recent Labs  Lab 02/14/20 1905 02/14/20 1911  WBC 11.4*  --   NEUTROABS 9.9*  --   HGB 10.6* 11.2*  HCT 35.0* 33.0*  MCV 95.4  --   PLT 561*  --    Basic Metabolic Panel: Recent Labs  Lab 02/14/20 1905 02/14/20 1911  02/15/20 0052 02/16/20 0828 02/17/20 0313  NA 132* 133* 132* 135 136  K 3.7 3.7 3.7 3.8 4.5  CL 97* 97* 97* 101 99  CO2 23  --  24 24 23   GLUCOSE 114* 115* 111* 102* 102*  BUN 9 9 7 8 10   CREATININE 0.86 0.80 0.88 0.71 0.78  CALCIUM 8.9  --  8.6* 8.7* 9.0   GFR: Estimated Creatinine Clearance (by C-G formula based on SCr of 0.78 mg/dL) Male: 100.9 mL/min Male: 127.1 mL/min Liver Function Tests: Recent Labs  Lab 02/14/20 1905 02/15/20 0052 02/16/20 0828 02/17/20 0313  AST 25 24 22 22   ALT 13 12 10 11   ALKPHOS 56 55 43 48  BILITOT 0.5 0.7 0.6 0.3  PROT 9.4* 8.3* 7.6 8.3*  ALBUMIN 2.3* 2.0* 1.8* 2.0*   No results for input(s): LIPASE, AMYLASE in the last 168 hours. No results for input(s): AMMONIA in the last 168 hours. Coagulation Profile: Recent Labs  Lab 02/14/20 1905  INR 1.1   Cardiac Enzymes: No results for input(s): CKTOTAL, CKMB, CKMBINDEX, TROPONINI in the last 168 hours. BNP (last 3 results) No results for input(s): PROBNP in the last 8760 hours. HbA1C: Recent Labs    02/15/20 0052  HGBA1C 5.9*   CBG:  No results for input(s): GLUCAP in the last 168 hours. Lipid Profile: Recent Labs    02/15/20 0052  CHOL 108  HDL 18*  LDLCALC 71  TRIG 97  CHOLHDL 6.0   Thyroid Function Tests: No results for input(s): TSH, T4TOTAL, FREET4, T3FREE, THYROIDAB in the last 72 hours. Anemia Panel: Recent Labs    02/16/20 0828 02/17/20 0550  FERRITIN 863* 830*      Radiology Studies: I have reviewed all of the imaging during this hospital visit personally     Scheduled Meds: .  stroke: mapping our early stages of recovery book   Does not apply Once  . aspirin EC  81 mg Oral Daily  . bictegravir-emtricitabine-tenofovir AF  1 tablet Oral Daily  . dexamethasone (DECADRON) injection  6 mg Intravenous Q24H  . enoxaparin (LOVENOX) injection  40 mg Subcutaneous Q24H  . influenza vac split quadrivalent PF  0.5 mL Intramuscular Tomorrow-1000  .  pneumococcal 23 valent vaccine  0.5 mL Intramuscular Tomorrow-1000  . sulfamethoxazole-trimethoprim  1 tablet Oral Daily   Continuous Infusions: . dextrose 5% lactated ringers 75 mL/hr at 02/16/20 0402  . remdesivir 100 mg in NS 100 mL 100 mg (02/16/20 1018)     LOS: 3 days        Basya Casavant Annett Gula, MD

## 2020-02-17 NOTE — Progress Notes (Signed)
Initial Nutrition Assessment  DOCUMENTATION CODES:   Not applicable  INTERVENTION:  If unable to advance diet,   Recommend enteral nutrition via Cortrak NGT using Osmolite 1.5 formula at 25 ml/hr and increase by 10 ml every 4 hours to goal rate of 55 ml/hr.  Provide 30 ml Prostat BID per tube.   Tube feeding regimen to provide 2180 kcal, 113 grams of protein, and 1003 ml free water.   Cortrak feeding tube placement service available tomorrow.   NUTRITION DIAGNOSIS:   Increased nutrient needs related to chronic illness(HIV/AIDS) as evidenced by estimated needs.  GOAL:   Patient will meet greater than or equal to 90% of their needs  MONITOR:   Diet advancement, Skin, Weight trends, Labs, I & O's  REASON FOR ASSESSMENT:   Malnutrition Screening Tool    ASSESSMENT:   34 year old male who presented with weakness.  He does have significant past medical history of HIV/AIDS, CD4 count 84, viral load 1.8, latent syphilis, and cocaine abuse presents with generalized weakness. Pt with a working diagnosis of acute right MCA infarct, ischemic/hemorrhagic, in the setting of acute SARS COVID-19 viral pneumonia. Gender identifies male.    RD working remotely.  Pt with aphasia with inconsistent responsiveness. RD unable to obtain pt nutrition history at this time. Pt with no significant weight loss per weight records. Pt underwent swallow evaluation with SLP today. Pt with moderate aspiration risk and not appropriate for diet initiation. Recommendation for NPO status with consideration of short term alternative means of nutrition. SLP to continue to follow to determine readiness for PO diet. Tube feeding recommendations stated above if tube feeding warranted. Cortrak feeding tube service available tomorrow.   Unable to complete Nutrition-Focused physical exam at this time.   Labs and medications reviewed.   Diet Order:   Diet Order            Diet NPO time specified  Diet effective  now              EDUCATION NEEDS:   Not appropriate for education at this time  Skin:  Skin Assessment: Reviewed RN Assessment  Last BM:  3/27  Height:   Ht Readings from Last 1 Encounters:  02/15/20 5\' 8"  (1.727 m)    Weight:   Wt Readings from Last 1 Encounters:  02/15/20 69 kg    Ideal Body Weight:  70 kg  BMI:  Body mass index is 23.13 kg/m.  Estimated Nutritional Needs:   Kcal:  2050-2250  Protein:  105-115 grams  Fluid:  >/= 2 L/day    06-22-1979, MS, RD, LDN RD pager number/after hours weekend pager number on Amion.

## 2020-02-17 NOTE — Progress Notes (Signed)
Family Update  Patients mother called for POC update and patient status. Primary RN reviewed current status and POC with mother. Mother verbalizes comprehension of teaching and offers prayer for patient and staff caring for patient.

## 2020-02-17 NOTE — Progress Notes (Addendum)
Patient ID: Tyler Vargas, adult   DOB: [birth year], 34 y.o.   MRN: [id number]         [facility]  Date of Admission:  02/14/2020           Day 3 remdesivir and steroids ASSESSMENT: She has advanced HIV infection with superimposed Covid infection and acute CVA.  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  However CSF VDRL is negative so it is not due to neurosyphilis.  Given that she will be in a supervised setting for the foreseeable future this will facilitate Biktarvy therapy.  PLAN: 1. Continue Biktarvy and Trimethoprim/Sulfamethoxazole 2. She has a follow-up appointment in our clinic in May 3. I will sign off now  Principal Problem:   Acute CVA (cerebrovascular accident) (HCC) Active Problems:   AIDS (acquired immune deficiency syndrome) (HCC)   COVID-19   History of syphilis   Cocaine use disorder, mild, abuse (HCC)   Scheduled Meds: .  stroke: mapping our early stages of recovery book   Does not apply Once  . aspirin EC  81 mg Oral Daily  . bictegravir-emtricitabine-tenofovir AF  1 tablet Oral Daily  . dexamethasone (DECADRON) injection  6 mg Intravenous Q24H  . enoxaparin (LOVENOX) injection  40 mg Subcutaneous Q24H  . influenza vac split quadrivalent PF  0.5 mL Intramuscular Tomorrow-1000  . pneumococcal 23 valent vaccine  0.5 mL Intramuscular Tomorrow-1000  . sulfamethoxazole-trimethoprim  1 tablet Oral Daily   Continuous Infusions: . dextrose 5% lactated ringers 75 mL/hr at 02/16/20 0402  . remdesivir 100 mg in NS 100 mL 100 mg (02/17/20 0852)   PRN Meds:.acetaminophen **OR** acetaminophen (TYLENOL) oral liquid 160 mg/5 mL **OR** acetaminophen, senna-docusate  Review of Systems: Review of Systems  Unable to perform ROS: Mental acuity    No Known Allergies  OBJECTIVE: Vitals:   02/16/20 2355 02/17/20 0400 02/17/20 0736 02/17/20 1151  BP:  112/82 111/76 103/68 109/72  Pulse:  (!) 57 (!) 57 65  Resp: 16 16 16 18   Temp: 97.6 F (36.4 C)  97.8 F (36.6 C) 97.9 F (36.6 C)  TempSrc: Axillary  Oral Oral  SpO2:  98% 96% 97%  Weight:      Height:       Body mass index is 23.13 kg/m.  Physical Exam Constitutional:      Comments: She opens her eyes but does not follow commands.  Cardiovascular:     Rate and Rhythm: Normal rate and regular rhythm.     Heart sounds: No murmur.  Pulmonary:     Effort: Pulmonary effort is normal.     Breath sounds: Normal breath sounds.  Abdominal:     Palpations: Abdomen is soft.  Musculoskeletal:        General: No swelling or tenderness.     Cervical back: Neck supple.  Skin:    Findings: No rash.     Lab Results Lab Results  Component Value Date   WBC 11.4 (H) 02/14/2020   HGB 11.2 (L) 02/14/2020   HCT 33.0 (L) 02/14/2020   MCV 95.4 02/14/2020   PLT 561 (H) 02/14/2020    Lab Results  Component Value Date   CREATININE 0.78 02/17/2020   BUN 10 02/17/2020   NA 136 02/17/2020   K 4.5 02/17/2020   CL 99 02/17/2020   CO2 23 02/17/2020    Lab Results  Component Value Date   ALT 11 02/17/2020  AST 22 02/17/2020   ALKPHOS 48 02/17/2020   BILITOT 0.3 02/17/2020     Microbiology: Recent Results (from the past 240 hour(s))  Culture, blood (Routine X 2) w Reflex to ID Panel     Status: None (Preliminary result)   Collection Time: 02/14/20  9:40 PM   Specimen: BLOOD  Result Value Ref Range Status   Specimen Description BLOOD RIGHT ARM  Final   Special Requests   Final    BOTTLES DRAWN AEROBIC AND ANAEROBIC Blood Culture adequate volume   Culture   Final    NO GROWTH 3 DAYS Performed at [facility], [place] [address] [place], [place] [address]    Report Status PENDING  Incomplete  Culture, blood (Routine X 2) w Reflex to ID Panel     Status: None (Preliminary result)   Collection Time: 02/14/20  9:46 PM   Specimen: BLOOD  Result Value Ref Range Status    Specimen Description BLOOD RIGHT FOREARM  Final   Special Requests   Final    BOTTLES DRAWN AEROBIC AND ANAEROBIC Blood Culture adequate volume   Culture   Final    NO GROWTH 3 DAYS Performed at [facility], [place] [address] [place], [place] [address]    Report Status PENDING  Incomplete  Urine culture     Status: Abnormal   Collection Time: 02/14/20  9:46 PM   Specimen: In/Out Cath Urine  Result Value Ref Range Status   Specimen Description IN/OUT CATH URINE  Final   Special Requests   Final    NONE Performed at [facility], [place] [address], [place], [place] [address]    Culture MULTIPLE SPECIES PRESENT, SUGGEST RECOLLECTION (A)  Final   Report Status 02/16/2020 FINAL  Final  SARS CORONAVIRUS 2 (TAT 6-24 HRS)     Status: Abnormal   Collection Time: 02/14/20 10:55 PM  Result Value Ref Range Status   SARS Coronavirus 2 POSITIVE (A) NEGATIVE Final    Comment: RESULT CALLED TO, READ BACK BY AND VERIFIED WITH: K. GUY,RN 0335 02/15/2020 T. TYSOR (NOTE) SARS-CoV-2 target nucleic acids are DETECTED. The SARS-CoV-2 RNA is generally detectable in upper and lower respiratory specimens during the acute phase of infection. Positive results are indicative of the presence of SARS-CoV-2 RNA. Clinical correlation with patient history and other diagnostic information is  necessary to determine patient infection status. Positive results do not rule out bacterial infection or co-infection with other viruses.  The expected result is Negative. Fact Sheet for Patients: SugarRoll.be Fact Sheet for Healthcare Providers: https://www.woods-mathews.com/ This test is not yet approved or cleared by the [place] FDA and  has been authorized for detection and/or diagnosis of SARS-CoV-2 by FDA under an Emergency Use Authorization (EUA). This EUA will remain  in effect (meaning this test can be used) for the  duration of the COVID-19 declaration under  Section 564(b)(1) of the Act, 21 U.S.C. section 360bbb-3(b)(1), unless the authorization is terminated or revoked sooner. Performed at [facility], [place] [address] [place], [place] [address]   CSF culture with Stat gram stain     Status: None (Preliminary result)   Collection Time: 02/14/20 11:35 PM   Specimen: CSF; Cerebrospinal Fluid  Result Value Ref Range Status   Specimen Description CSF  Final   Special Requests NONE  Final   Gram Stain   Final    CYTOSPIN SMEAR WBC PRESENT,BOTH PMN AND MONONUCLEAR NO ORGANISMS SEEN    Culture  Final    NO GROWTH 2 DAYS Performed at [facility], [address], [place], [place] [address]    Report Status PENDING  Incomplete    Cliffton Asters, MD [facility] for Infectious Disease [facility] [phone] pager   [phone] cell 02/17/2020, 12:14 PM

## 2020-02-18 ENCOUNTER — Inpatient Hospital Stay (HOSPITAL_COMMUNITY): Payer: Medicaid Other

## 2020-02-18 DIAGNOSIS — R609 Edema, unspecified: Secondary | ICD-10-CM

## 2020-02-18 LAB — CSF CULTURE W GRAM STAIN: Culture: NO GROWTH

## 2020-02-18 LAB — D-DIMER, QUANTITATIVE: D-Dimer, Quant: 2.25 ug/mL-FEU — ABNORMAL HIGH (ref 0.00–0.50)

## 2020-02-18 LAB — COMPREHENSIVE METABOLIC PANEL
ALT: 18 U/L (ref 0–44)
AST: 43 U/L — ABNORMAL HIGH (ref 15–41)
Albumin: 1.8 g/dL — ABNORMAL LOW (ref 3.5–5.0)
Alkaline Phosphatase: 41 U/L (ref 38–126)
Anion gap: 11 (ref 5–15)
BUN: 12 mg/dL (ref 6–20)
CO2: 22 mmol/L (ref 22–32)
Calcium: 8.6 mg/dL — ABNORMAL LOW (ref 8.9–10.3)
Chloride: 103 mmol/L (ref 98–111)
Creatinine, Ser: 0.75 mg/dL (ref 0.61–1.24)
GFR calc Af Amer: >60 mL/min (ref >60)
GFR calc non Af Amer: >60 mL/min (ref >60)
Glucose, Bld: 371 mg/dL — ABNORMAL HIGH (ref 70–99)
Potassium: 3.8 mmol/L (ref 3.5–5.1)
Sodium: 136 mmol/L (ref 135–145)
Total Bilirubin: <0.1 mg/dL — ABNORMAL LOW (ref 0.3–1.2)
Total Protein: 7.2 g/dL (ref 6.5–8.1)

## 2020-02-18 LAB — GLUCOSE, CAPILLARY
Glucose-Capillary: 109 mg/dL — ABNORMAL HIGH (ref 70–99)
Glucose-Capillary: 122 mg/dL — ABNORMAL HIGH (ref 70–99)

## 2020-02-18 LAB — FERRITIN: Ferritin: 695 ng/mL — ABNORMAL HIGH (ref 24–336)

## 2020-02-18 LAB — C-REACTIVE PROTEIN: CRP: 2.2 mg/dL — ABNORMAL HIGH (ref <1.0)

## 2020-02-18 MED ORDER — OSMOLITE 1.2 CAL PO LIQD: 1000.0000 mL | ORAL | Status: DC

## 2020-02-18 MED ORDER — OSMOLITE 1.5 CAL PO LIQD
1000.0000 mL | ORAL | Status: DC
  Administered 2020-02-18 – 2020-02-24 (×7): 1000 mL
  Filled 2020-02-18 (×11): qty 1000

## 2020-02-18 MED ORDER — PRO-STAT SUGAR FREE PO LIQD
30.0000 mL | Freq: Two times a day (BID) | ORAL | Status: DC
  Administered 2020-02-18 – 2020-02-24 (×12): 30 mL
  Filled 2020-02-18 (×12): qty 30

## 2020-02-18 NOTE — Progress Notes (Signed)
Physical Therapy Treatment Note  Patient requires total assist for bed mobility and maxA initially to maintain sitting balance at EOB. Patient does progress to minA for sitting balance EOB with improved positioning and feet supported. Continued recommendation for skilled PT services and discharge to SNF for short term rehabiliation.     02/18/20 1156  PT Visit Information  Last PT Received On 02/18/20  Assistance Needed +2  History of Present Illness 34 year old transgender adult with medical history significant for HIV/AIDS with CD4 count 84 and viral load 1.8 million earlier this month, latent syphilis, and cocaine abuse, now presenting to the emergency department with weakness. Pt aphasic and initially not following commands in ED, eventually able to follow commands but not provide history. 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 Acute CVA thought to be secondary to hypercoagulable state from Covid 19 and cocaine use. Pt also + COVID. Admitted 02/14/20 for treatment of acute CVA.  Echo without embolic source, CTA neck without any significant extracranial stenosis, transcranial Doppler negative for PFO. Neurology recommends to continue aspirin. Patient on Enoxaparin for DVT prophylaxis.   Subjective Data  Subjective Patient non verbal during session.  Precautions  Precautions Fall;Other (comment)  Precaution Comments Aspiration precautions  Restrictions  Weight Bearing Restrictions No  Pain Assessment  Pain Assessment Faces  Faces Pain Scale 0  Cognition  Arousal/Alertness Lethargic  Behavior During Therapy Flat affect  Overall Cognitive Status Difficult to assess  General Comments Eyes open (R more than L ) but unable to nod head or provide reliable yes/no responses.  Difficult to assess due to Level of  arousal;Impaired communication  Bed Mobility  Overal bed mobility Needs Assistance  Bed Mobility Supine to Sit;Sit to Supine  Supine to sit Total assist;HOB elevated;+2 for physical assistance;+2 for safety/equipment  Sit to supine Total assist;+2 for physical assistance;+2 for safety/equipment;HOB elevated  General bed mobility comments Patient does not appear to assist with bed mobility.  Balance  Overall balance assessment Needs assistance  Sitting-balance support Feet supported;Bilateral upper extremity supported  Sitting balance-Leahy Scale Poor  Sitting balance - Comments MaxA sitting balance progressing to minA. Sitting approx 5 minutes at EOB  Postural control Posterior lean  Standing balance comment not safe to assess currently  General Comments  General comments (skin integrity, edema, etc.) VSS on room air during session. BP sitting EOB 127/93. O2 sat 93% at end of session  PT - End of Session  Activity Tolerance Patient limited by lethargy  Patient left in bed;with call bell/phone within reach;with bed alarm set  Nurse Communication Other (comment) (PT session time)   PT - Assessment/Plan  PT Plan Current plan remains appropriate  PT Visit Diagnosis Unsteadiness on feet (R26.81);Other abnormalities of gait and mobility (R26.89);Muscle weakness (generalized) (M62.81);Difficulty in walking, not elsewhere classified (R26.2);Hemiplegia and hemiparesis  Hemiplegia - Right/Left Right  Hemiplegia - caused by Cerebral infarction  PT Frequency (ACUTE ONLY) Min 3X/week  Follow Up Recommendations SNF  PT equipment Other (comment) (TBD at next level of care)  AM-PAC PT "6 Clicks" Mobility Outcome Measure (Version 2)  Help needed turning from your back to your side while in a flat bed without using bedrails? 1  Help needed moving from lying on your back to sitting on the side of a flat bed without using bedrails? 1  Help needed moving  to and from a bed to a chair (including a  wheelchair)? 1  Help needed standing up from a chair using your arms (e.g., wheelchair or bedside chair)? 1  Help needed to walk in hospital room? 1  Help needed climbing 3-5 steps with a railing?  1  6 Click Score 6  Consider Recommendation of Discharge To: CIR/SNF/LTACH  PT Goal Progression  Progress towards PT goals Progressing toward goals  Acute Rehab PT Goals  PT Goal Formulation Patient unable to participate in goal setting  PT Time Calculation  PT Start Time (ACUTE ONLY) 1156  PT Stop Time (ACUTE ONLY) 1212  PT Time Calculation (min) (ACUTE ONLY) 16 min  PT General Charges  $$ ACUTE PT VISIT 1 Visit  PT Treatments  $Therapeutic Activity 8-22 mins   Birdie Hopes, PT, DPT Acute Rehab [phone] office

## 2020-02-18 NOTE — Plan of Care (Signed)
  Problem: Education: Goal: Knowledge of General Education information will improve Description: Including pain rating scale, medication(s)/side effects and non-pharmacologic comfort measures Outcome: Progressing   Problem: Health Behavior/Discharge Planning: Goal: Ability to manage health-related needs will improve Outcome: Progressing   Problem: Clinical Measurements: Goal: Ability to maintain clinical measurements within normal limits will improve Outcome: Progressing Goal: Will remain free from infection Outcome: Progressing Goal: Diagnostic test results will improve Outcome: Progressing Goal: Respiratory complications will improve Outcome: Progressing Goal: Cardiovascular complication will be avoided Outcome: Progressing   Problem: Activity: Goal: Risk for activity intolerance will decrease Outcome: Progressing   Problem: Nutrition: Goal: Adequate nutrition will be maintained Outcome: Progressing   Problem: Coping: Goal: Level of anxiety will decrease Outcome: Progressing   Problem: Elimination: Goal: Will not experience complications related to bowel motility Outcome: Progressing Goal: Will not experience complications related to urinary retention Outcome: Progressing   Problem: Pain Managment: Goal: General experience of comfort will improve Outcome: Progressing   Problem: Safety: Goal: Ability to remain free from injury will improve Outcome: Progressing   Problem: Skin Integrity: Goal: Risk for impaired skin integrity will decrease Outcome: Progressing   Problem: Education: Goal: Knowledge of disease or condition will improve Outcome: Progressing Goal: Knowledge of secondary prevention will improve Outcome: Progressing Goal: Individualized Educational Video(s) Outcome: Progressing   Problem: Coping: Goal: Will verbalize positive feelings about self Outcome: Progressing Goal: Will identify appropriate support needs Outcome: Progressing    Problem: Health Behavior/Discharge Planning: Goal: Ability to manage health-related needs will improve Outcome: Progressing

## 2020-02-18 NOTE — Progress Notes (Signed)
Nutrition Follow-up  DOCUMENTATION CODES:   Not applicable  INTERVENTION:  Once Cortrak NGT placed and ready to use,  Initiate Osmolite 1.5 @ 25 ml/hr and increase by 10 ml every 4 hours to goal rate of 55 ml/hr.   Provide 30 ml Prostat BID per tube.   Tube feeding regimen provides 2180 kcal (100% of needs), 113 grams of protein, and 1003 ml of H2O.   NUTRITION DIAGNOSIS:   Increased nutrient needs related to chronic illness(HIV/AIDS) as evidenced by estimated needs; ongoing  GOAL:   Patient will meet greater than or equal to 90% of their needs; progressing  MONITOR:   TF tolerance, Skin, Weight trends, Labs, I & O's  REASON FOR ASSESSMENT:   Malnutrition Screening Tool    ASSESSMENT:   34 year old male who presented with weakness.  He does have significant past medical history of HIV/AIDS, CD4 count 84, viral load 1.8, latent syphilis, and cocaine abuse presents with generalized weakness. Pt with a working diagnosis of acute right MCA infarct, ischemic/hemorrhagic, in the setting of acute SARS COVID-19 viral pneumonia. Gender identifies male.  RD working remotely.  Pt remains NPO and aphasic. Plans for Cortrak NGT to be placed today for tube feeding. RD given verbal consent from MD to initiate and manage tube feedings. RD to order.   Labs and medications reviewed.   Diet Order:   Diet Order            Diet NPO time specified  Diet effective now              EDUCATION NEEDS:   Not appropriate for education at this time  Skin:  Skin Assessment: Reviewed RN Assessment  Last BM:  3/27  Height:   Ht Readings from Last 1 Encounters:  02/15/20 5\' 8"  (1.727 m)    Weight:   Wt Readings from Last 1 Encounters:  02/15/20 69 kg    Ideal Body Weight:  70 kg  BMI:  Body mass index is 23.13 kg/m.  Estimated Nutritional Needs:   Kcal:  2050-2250  Protein:  105-115 grams  Fluid:  >/= 2 L/day    06-22-1979, MS, RD, LDN RD pager  number/after hours weekend pager number on Amion.

## 2020-02-18 NOTE — Procedures (Signed)
Cortrak  Person Inserting Tube:  King, Yeslin Delio E, RD Tube Type:  Cortrak - 43 inches Tube Location:  Left nare Initial Placement:  Stomach Secured by: Bridle Technique Used to Measure Tube Placement:  Documented cm marking at nare/ corner of mouth Cortrak Secured At:  66 cm    Cortrak Tube Team Note:  Consult received to place a Cortrak feeding tube.   No x-ray is required. RN may begin using tube.   If the tube becomes dislodged please keep the tube and contact the Cortrak team at [email or web address] (password [email or web address]) for replacement.  If after hours and replacement cannot be delayed, place a NG tube and confirm placement with an abdominal x-ray.   Winnifred Dufford King, MS, RD, LDN Pager number available on Amion 

## 2020-02-18 NOTE — Progress Notes (Signed)
Upper extremity venous has been completed.   Preliminary results in CV Proc.   Blanch Media 02/18/2020 2:43 PM

## 2020-02-18 NOTE — Progress Notes (Addendum)
Results for BERGEN, MAGNER (MRN [id number]) as of 02/18/2020 10:46  Ref. Range 02/15/2020 00:52 02/16/2020 08:28 02/17/2020 03:13 02/18/2020 03:36  Glucose Latest Ref Range: 70 - 99 mg/dL 291 (H) 916 (H) 606 (H) 371 (H)   Results for JAHSIR, RAMA (MRN [id number]) as of 02/18/2020 10:46  Ref. Range 02/15/2020 00:52  Hemoglobin A1C Latest Ref Range: 4.8 - 5.6 % 5.9 (H)    Admit with: Acute CVA/ COVID 19 + Pneumonia  History of HIV/AIDS/ Cocaine Abuse  NO History of Diabetes noted     MD- Note patient getting Decadron 6 mg Daily.  Lab glucose elevated to 371 mg/dl this AM--Unsure if this is an accurate lab??  May consider placing orders for CBG checks Q4 hours     --Will follow patient during hospitalization--  Ambrose Finland RN, MSN, CDE Diabetes Coordinator Inpatient Glycemic Control Team Team Pager: [phone] (8a-5p)

## 2020-02-18 NOTE — Progress Notes (Signed)
PROGRESS NOTE                                                                                                                                                                                                             Patient Demographics:    Tyler Vargas, is a 34 y.o. adult, DOB - 1986-04-11, GUR:[id number]  Outpatient Primary MD for the patient is Patient, No Pcp Per   Admit date - 02/14/2020   LOS - 4  Chief Complaint  Patient presents with  . Code Stroke       Brief Narrative: Patient is a 34 y.o. 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  See below for further details.  Significant Events: 3/27>> admit to [facility] for acute CVA.  COVID-19 medications: Steroids: 3/28>> Remdesivir: 3/28>>  Antibiotics: Penicillin G: 3/27>> 3/28  Microbiology data: 3/27>> Blood culture: pending 3/27>> CSF culture: Negative 3/27>> urine culture: Multiple morphotypes 3/27>> VDRL CSF: Nonreactive 3/27>> cryptococcal antigen negative  DVT prophylaxis: SQ Lovenox  Procedures: 3/27 >>lumbar puncture: By ED MD  Consults: ID Neuro    Subjective:    Tyler Vargas today remains aphasic-we will track you movement in the room.  Has significant swelling of his right upper extremity.   Assessment  & Plan :   Acute CVA: Remains aphasic with dense left-sided hemiplegia.  Currently tracks my movement.  Remains with significant dysphagia-likely will require NG tube feedings and close speech therapy follow-up.  Thought to be secondary to hypercoagulable state from Covid 19-cocaine use.  Echo without embolic source, CTA neck without any significant extracranial stenosis, transcranial Doppler negative for PFO LDL 71, A1c 5.9.  Recommendations from neurology are to continue aspirin.  If no improvement in dysphagia-may require PEG tube at  some point  Covid 19 Viral pneumonia: Remains stable on room air-inflammatory markers downtrending-continue steroids/remdesivir  Fever: afebrile  O2 requirements:  SpO2: 98 %   COVID-19 Labs: Recent Labs    02/16/20 0828 02/17/20 0313 02/17/20 0550 02/18/20 0336  DDIMER 3.32* 2.66*  --  2.25*  FERRITIN 863*  --  830* 695*  CRP 5.7*  --  3.5* 2.2*    No results found for: BNP  No results for input(s): PROCALCITON in the last 168 hours.  Lab Results  Component Value Date   SARSCOV2NAA POSITIVE (A) 02/14/2020     HIV/AIDS: Continue antiretrovirals.  History of syphilis: CSF VDRL negative-not likely to have neurosyphilis as the cause of CVA.  No longer on penicillin G.  Cocaine use: We will counsel when more awake and alert  Right upper extremity swelling: Obtaining Doppler to rule out DVT.  Per nursing staff-patient had a IV that infiltrated.  Nutrition Problem: Nutrition Problem: Increased nutrient needs Etiology: chronic illness(HIV/AIDS) Signs/Symptoms: estimated needs Interventions: Refer to RD note for recommendations    ABG:    Component Value Date/Time   PHART 7.254 (L) 03/07/2014 1019   PCO2ART 40.7 03/07/2014 1019   PO2ART 107.0 (H) 03/07/2014 1019   HCO3 18.0 (L) 03/07/2014 1019   TCO2 27 02/14/2020 1911   ACIDBASEDEF 9.0 (H) 03/07/2014 1019   O2SAT 97.0 03/07/2014 1019    Vent Settings: N/A  Condition - Guarded-  Family Communication  :  Father updated over the phone  Code Status :  Full Code  Diet :  Diet Order            Diet NPO time specified  Diet effective now               Disposition Plan  :  Remain hospitalized  Barriers to discharge: Complete 5 days of IV Remdesivir  Antimicorbials  :    Anti-infectives (From admission, onward)   Start     Dose/Rate Route Frequency Ordered Stop   02/17/20 0000  bictegravir-emtricitabine-tenofovir AF (BIKTARVY) 50-200-25 MG TABS tablet     1 tablet Oral Daily 02/17/20 1250 03/18/20  2359   02/16/20 1000  remdesivir 100 mg in sodium chloride 0.9 % 100 mL IVPB     100 mg 200 mL/hr over 30 Minutes Intravenous Daily 02/15/20 1016 02/20/20 0959   02/15/20 1100  remdesivir 200 mg in sodium chloride 0.9% 250 mL IVPB     200 mg 580 mL/hr over 30 Minutes Intravenous Once 02/15/20 1016 02/15/20 1150   02/15/20 1000  bictegravir-emtricitabine-tenofovir AF (BIKTARVY) 50-200-25 MG per tablet 1 tablet    Note to Pharmacy: Try to take at the same time each day with or without food.     1 tablet Oral Daily 02/14/20 2231     02/15/20 1000  sulfamethoxazole-trimethoprim (BACTRIM DS) 800-160 MG per tablet 1 tablet     1 tablet Oral Daily 02/14/20 2231     02/15/20 0530  vancomycin (VANCOCIN) IVPB 1000 mg/200 mL premix  Status:  Discontinued     1,000 mg 200 mL/hr over 60 Minutes Intravenous Every 8 hours 02/14/20 2120 02/15/20 0957   02/14/20 2215  penicillin G potassium 4 Million Units in dextrose 5 % 250 mL IVPB  Status:  Discontinued     4 Million Units 250 mL/hr over 60 Minutes Intravenous Every 4 hours 02/14/20 2202 02/15/20 1653   02/14/20 2200  ceFEPIme (MAXIPIME) 2 g in sodium chloride 0.9 % 100 mL IVPB  Status:  Discontinued     2 g 200 mL/hr over 30 Minutes Intravenous Every 8 hours 02/14/20 2120 02/14/20 2145   02/14/20 2130  vancomycin (VANCOREADY) IVPB 1500 mg/300 mL     1,500 mg 150 mL/hr over 120 Minutes Intravenous  Once 02/14/20 2120 02/15/20 0044   02/14/20 2115  ceFEPIme (MAXIPIME) 2 g in sodium chloride 0.9 % 100 mL IVPB  Status:  Discontinued     2 g 200 mL/hr over 30 Minutes Intravenous  Once 02/14/20 2103 02/14/20 2120  02/14/20 2115  metroNIDAZOLE (FLAGYL) IVPB 500 mg  Status:  Discontinued     500 mg 100 mL/hr over 60 Minutes Intravenous  Once 02/14/20 2103 02/14/20 2120   02/14/20 2115  vancomycin (VANCOCIN) IVPB 1000 mg/200 mL premix  Status:  Discontinued     1,000 mg 200 mL/hr over 60 Minutes Intravenous  Once 02/14/20 2103 02/14/20 2120       Inpatient Medications  Scheduled Meds: .  stroke: mapping our early stages of recovery book   Does not apply Once  . aspirin  300 mg Rectal Daily  . bictegravir-emtricitabine-tenofovir AF  1 tablet Oral Daily  . dexamethasone (DECADRON) injection  6 mg Intravenous Q24H  . enoxaparin (LOVENOX) injection  40 mg Subcutaneous Q24H  . influenza vac split quadrivalent PF  0.5 mL Intramuscular Tomorrow-1000  . pneumococcal 23 valent vaccine  0.5 mL Intramuscular Tomorrow-1000  . sulfamethoxazole-trimethoprim  1 tablet Oral Daily   Continuous Infusions: . dextrose 5% lactated ringers 75 mL/hr at 02/17/20 2358  . remdesivir 100 mg in NS 100 mL 100 mg (02/18/20 0959)   PRN Meds:.acetaminophen **OR** acetaminophen (TYLENOL) oral liquid 160 mg/5 mL **OR** acetaminophen, senna-docusate   Time Spent in minutes  25  See all Orders from today for further details   Jeoffrey Massed M.D on 02/18/2020 at 11:47 AM  To page go to [email or web address] - use universal password  [facility] -  Office  [phone]    Objective:   Vitals:   02/17/20 2000 02/18/20 0000 02/18/20 0420 02/18/20 0750  BP: 98/65 (!) 122/92 (!) 128/91 121/84  Pulse: 62 69 63 64  Resp: 15 18 20  (!) 22  Temp: (!) 97.3 F (36.3 C) 97.7 F (36.5 C) 97.8 F (36.6 C) (!) 97.1 F (36.2 C)  TempSrc: Oral Oral Oral Axillary  SpO2: 94% 97% 96% 98%  Weight:      Height:        Wt Readings from Last 3 Encounters:  02/15/20 69 kg  01/28/20 69.9 kg  10/30/19 57.2 kg     Intake/Output Summary (Last 24 hours) at 02/18/2020 1147 Last data filed at 02/18/2020 0600 Gross per 24 hour  Intake 765 ml  Output 750 ml  Net 15 ml     Physical Exam Gen Exam:Alert awake-aphasic. HEENT:atraumatic, normocephalic Chest: B/L clear to auscultation anteriorly CVS:S1S2 regular Abdomen:soft non tender, non distended Extremities:no edema Neurology: Difficult exam as will not follow commands-but appears to have significant  left-sided weakness compared to the right. Skin: no rash   Data Review:    CBC Recent Labs  Lab 02/14/20 1905 02/14/20 1911  WBC 11.4*  --   HGB 10.6* 11.2*  HCT 35.0* 33.0*  PLT 561*  --   MCV 95.4  --   MCH 28.9  --   MCHC 30.3  --   RDW 15.7*  --   LYMPHSABS 0.8  --   MONOABS 0.5  --   EOSABS 0.0  --   BASOSABS 0.1  --     Chemistries  Recent Labs  Lab 02/14/20 1905 02/14/20 1905 02/14/20 1911 02/15/20 0052 02/16/20 0828 02/17/20 0313 02/18/20 0336  NA 132*   < > 133* 132* 135 136 136  K 3.7   < > 3.7 3.7 3.8 4.5 3.8  CL 97*   < > 97* 97* 101 99 103  CO2 23  --   --  24 24 23 22   GLUCOSE 114*   < > 115* 111* 102* 102* 371*  BUN 9   < > 9 7 8 10 12   CREATININE 0.86   < > 0.80 0.88 0.71 0.78 0.75  CALCIUM 8.9  --   --  8.6* 8.7* 9.0 8.6*  AST 25  --   --  24 22 22  43*  ALT 13  --   --  12 10 11 18   ALKPHOS 56  --   --  55 43 48 41  BILITOT 0.5  --   --  0.7 0.6 0.3 <0.1*   < > = values in this interval not displayed.   ------------------------------------------------------------------------------------------------------------------ No results for input(s): CHOL, HDL, LDLCALC, TRIG, CHOLHDL, LDLDIRECT in the last 72 hours.  Lab Results  Component Value Date   HGBA1C 5.9 (H) 02/15/2020   ------------------------------------------------------------------------------------------------------------------ No results for input(s): TSH, T4TOTAL, T3FREE, THYROIDAB in the last 72 hours.  Invalid input(s): FREET3 ------------------------------------------------------------------------------------------------------------------ Recent Labs    02/17/20 0550 02/18/20 0336  FERRITIN 830* 695*    Coagulation profile Recent Labs  Lab 02/14/20 1905  INR 1.1    Recent Labs    02/17/20 0313 02/18/20 0336  DDIMER 2.66* 2.25*    Cardiac Enzymes No results for input(s): CKMB, TROPONINI, MYOGLOBIN in the last 168 hours.  Invalid input(s):  CK ------------------------------------------------------------------------------------------------------------------ No results found for: BNP  Micro Results Recent Results (from the past 240 hour(s))  Culture, blood (Routine X 2) w Reflex to ID Panel     Status: None (Preliminary result)   Collection Time: 02/14/20  9:40 PM   Specimen: BLOOD  Result Value Ref Range Status   Specimen Description BLOOD RIGHT ARM  Final   Special Requests   Final    BOTTLES DRAWN AEROBIC AND ANAEROBIC Blood Culture adequate volume   Culture   Final    NO GROWTH 3 DAYS Performed at [facility], [place] [address] [place], [place] [address]    Report Status PENDING  Incomplete  Culture, blood (Routine X 2) w Reflex to ID Panel     Status: None (Preliminary result)   Collection Time: 02/14/20  9:46 PM   Specimen: BLOOD  Result Value Ref Range Status   Specimen Description BLOOD RIGHT FOREARM  Final   Special Requests   Final    BOTTLES DRAWN AEROBIC AND ANAEROBIC Blood Culture adequate volume   Culture   Final    NO GROWTH 3 DAYS Performed at [facility], [place] [address] [place], [place] [address]    Report Status PENDING  Incomplete  Urine culture     Status: Abnormal   Collection Time: 02/14/20  9:46 PM   Specimen: In/Out Cath Urine  Result Value Ref Range Status   Specimen Description IN/OUT CATH URINE  Final   Special Requests   Final    NONE Performed at [facility], [place] [address], [place], [place] [address]    Culture MULTIPLE SPECIES PRESENT, SUGGEST RECOLLECTION (A)  Final   Report Status 02/16/2020 FINAL  Final  SARS CORONAVIRUS 2 (TAT 6-24 HRS)     Status: Abnormal   Collection Time: 02/14/20 10:55 PM  Result Value Ref Range Status   SARS Coronavirus 2 POSITIVE (A) NEGATIVE Final    Comment: RESULT CALLED TO, READ BACK BY AND VERIFIED WITH: K. GUY,RN 0335 02/15/2020 T. TYSOR (NOTE) SARS-CoV-2 target nucleic acids are DETECTED. The SARS-CoV-2 RNA is generally  detectable in upper and lower respiratory specimens during the acute phase of infection. Positive results are indicative of the presence of  SARS-CoV-2 RNA. Clinical correlation with patient history and other diagnostic information is  necessary to determine patient infection status. Positive results do not rule out bacterial infection or co-infection with other viruses.  The expected result is Negative. Fact Sheet for Patients: HairSlick.no Fact Sheet for Healthcare Providers: quierodirigir.com This test is not yet approved or cleared by the [place] FDA and  has been authorized for detection and/or diagnosis of SARS-CoV-2 by FDA under an Emergency Use Authorization (EUA). This EUA will remain  in effect (meaning this test can be used) for the  duration of the COVID-19 declaration under Section 564(b)(1) of the Act, 21 U.S.C. section 360bbb-3(b)(1), unless the authorization is terminated or revoked sooner. Performed at [facility], [address] [place], [place] [address]   CSF culture with Stat gram stain     Status: None   Collection Time: 02/14/20 11:35 PM   Specimen: CSF; Cerebrospinal Fluid  Result Value Ref Range Status   Specimen Description CSF  Final   Special Requests NONE  Final   Gram Stain   Final    CYTOSPIN SMEAR WBC PRESENT,BOTH PMN AND MONONUCLEAR NO ORGANISMS SEEN    Culture   Final    NO GROWTH 3 DAYS Performed at [facility], [address], [place], [place] [address]    Report Status 02/18/2020 FINAL  Final    Radiology Reports EEG  Result Date: 02/15/2020 Charlsie Quest, MD     02/15/2020  4:13 PM Patient Name: Tyler Vargas MRN: [id number] Epilepsy Attending: Charlsie Quest Referring Physician/Provider: Dr Georgiana Spinner Aroor Date: 02/15/2020 Duration: 23.26 mins Patient history: 33y male with HIV's, syphilis, cocaine abuse brought to ED by EMS for generalized weakness x 2 days  and developed sudden onset aphasia, possible right side weakness.  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 EEG to evaluate for seizure Level of alertness: asleep AEDs during EEG study: None Technical aspects: This EEG study was done with scalp electrodes positioned according to the 10-20 International system of electrode placement. Electrical activity was acquired at a sampling rate of 500Hz  and reviewed with a high frequency filter of 70Hz  and a low frequency filter of 1Hz . EEG data were recorded continuously and digitally stored. DESCRIPTION:  Sleep was characterized by vertex waves, sleep spindles, maximal frontocentral. Hyperventilation and photic stimulation were not performed. IMPRESSION: This study during sleep only is within normal limits. No seizures or epileptiform discharges were seen throughout the recording. If suspicion for interictal activity remains a concern, a prolonged study can be considered. Priyanka Annabelle Harman   CT ANGIO HEAD W OR WO CONTRAST  Result Date: 02/14/2020 CLINICAL DATA:  Stroke. Right-sided weakness and speech disturbance. EXAM: CT ANGIOGRAPHY HEAD AND NECK CT PERFUSION BRAIN TECHNIQUE: Multidetector CT imaging of the head and neck was performed using the standard protocol during bolus administration of intravenous contrast. Multiplanar CT image reconstructions and MIPs were obtained to evaluate the vascular anatomy. Carotid stenosis measurements (when applicable) are obtained utilizing NASCET criteria, using the distal internal carotid diameter as the denominator. Multiphase CT imaging of the brain was performed following IV bolus contrast injection. Subsequent parametric perfusion maps were calculated using RAPID software. CONTRAST:  40mL OMNIPAQUE IOHEXOL 350 MG/ML SOLN; 75mL OMNIPAQUE IOHEXOL 350 MG/ML SOLN COMPARISON:  None. FINDINGS: CTA NECK FINDINGS Aortic arch: Standard 3 vessel aortic  arch with widely patent arch vessel origins. Right carotid system: Patent and smooth without  evidence of stenosis or dissection. Left carotid system: Patent and smooth without evidence of stenosis or dissection. Vertebral arteries: Patent, smooth, and codominant without evidence of stenosis or dissection. Skeleton: Poor dentition with multiple periapical erosions. Other neck: Borderline enlarged level II lymph nodes bilaterally. Upper chest: Centrilobular emphysema. Small superior mediastinal lymph nodes. Review of the MIP images confirms the above findings CTA HEAD FINDINGS Anterior circulation: The internal carotid arteries are widely patent from skull base to carotid termini. The A1 and M1 segments are widely patent with the left A1 segment being mildly hypoplastic. There is a decreased number of branch vessels in the right MCA superior division with suggestion of occlusion and intermittent reconstitution of distal M2 and M3 branches. There is also multifocal irregular narrowing of M3 branches in the right MCA inferior division, and there is mild left MCA branch vessel irregularity. No aneurysm is identified. Posterior circulation: The intracranial vertebral arteries are widely patent to the basilar. The basilar artery is widely patent. There are small posterior communicating arteries bilaterally. PCAs are patent with mild branch vessel irregularity more notable on the right without evidence of flow limiting proximal stenosis. No aneurysm is identified. Venous sinuses: Patent. Anatomic variants: None. Review of the MIP images confirms the above findings CT Brain Perfusion Findings: ASPECTS: 4 CBF (<30%) Volume: 61mL Perfusion (Tmax>6.0s) volume: 3mL Mismatch Volume: 75mL, however the core infarct on noncontrast head CT is not being picked up by the automated processing and the true penumbra is less than 12 mL Infarction Location: Right MCA IMPRESSION: 1. Multiple missing right MCA branch vessels in the superior  division with wide patency of the M1 segment and no discrete proximal M2 occlusion identified. 2. Widely patent common carotid, internal carotid, and vertebral arteries. 3. CT perfusion demonstrating at most a small penumbra as detailed above. These results were called by telephone at the time of interpretation on 02/14/2020 at 7:55 p.m. to Dr. Laurence Slate, who verbally acknowledged these results. Electronically Signed   By: Sebastian Ache M.D.   On: 02/14/2020 20:22   CT HEAD WO CONTRAST  Result Date: 02/15/2020 CLINICAL DATA:  Stroke follow-up. COVID-19 infection. History of HIV. EXAM: CT HEAD WITHOUT CONTRAST TECHNIQUE: Contiguous axial images were obtained from the base of the skull through the vertex without intravenous contrast. COMPARISON:  Head MRI 02/15/2020 FINDINGS: Brain: Acute right MCA infarcts involving the basal ganglia, internal capsule, insula, and frontal operculum are unchanged as are small acute left basal ganglia and left internal capsule infarcts. A subcentimeter focus of hemorrhage in the right frontal operculum is unchanged. No new infarct, new intracranial hemorrhage, midline shift, or extra-axial fluid collection is identified. Mild ventricular prominence for age is unchanged and likely reflective of mild cerebral atrophy. Vascular: Hyperdense right MCA branch vessels in the sylvian fissure. Skull: No fracture or suspicious osseous lesion. Sinuses/Orbits: Mild right sphenoid sinus mucosal thickening. Small right maxillary sinus mucous retention cyst. Clear mastoid air cells. Unremarkable orbits. Other: None. IMPRESSION: 1. Unchanged acute right MCA infarcts with unchanged small hemorrhage in the right frontal operculum. 2. Unchanged small acute left basal ganglia and internal capsule infarcts. 3. No new intracranial abnormality. Electronically Signed   By: Sebastian Ache M.D.   On: 02/15/2020 19:40   CT ANGIO NECK W OR WO CONTRAST  Result Date: 02/14/2020 CLINICAL DATA:  Stroke. Right-sided  weakness and speech disturbance. EXAM: CT ANGIOGRAPHY HEAD AND NECK CT PERFUSION BRAIN TECHNIQUE: Multidetector CT imaging of the head and neck was performed using the standard protocol  during bolus administration of intravenous contrast. Multiplanar CT image reconstructions and MIPs were obtained to evaluate the vascular anatomy. Carotid stenosis measurements (when applicable) are obtained utilizing NASCET criteria, using the distal internal carotid diameter as the denominator. Multiphase CT imaging of the brain was performed following IV bolus contrast injection. Subsequent parametric perfusion maps were calculated using RAPID software. CONTRAST:  64mL OMNIPAQUE IOHEXOL 350 MG/ML SOLN; 51mL OMNIPAQUE IOHEXOL 350 MG/ML SOLN COMPARISON:  None. FINDINGS: CTA NECK FINDINGS Aortic arch: Standard 3 vessel aortic arch with widely patent arch vessel origins. Right carotid system: Patent and smooth without evidence of stenosis or dissection. Left carotid system: Patent and smooth without evidence of stenosis or dissection. Vertebral arteries: Patent, smooth, and codominant without evidence of stenosis or dissection. Skeleton: Poor dentition with multiple periapical erosions. Other neck: Borderline enlarged level II lymph nodes bilaterally. Upper chest: Centrilobular emphysema. Small superior mediastinal lymph nodes. Review of the MIP images confirms the above findings CTA HEAD FINDINGS Anterior circulation: The internal carotid arteries are widely patent from skull base to carotid termini. The A1 and M1 segments are widely patent with the left A1 segment being mildly hypoplastic. There is a decreased number of branch vessels in the right MCA superior division with suggestion of occlusion and intermittent reconstitution of distal M2 and M3 branches. There is also multifocal irregular narrowing of M3 branches in the right MCA inferior division, and there is mild left MCA branch vessel irregularity. No aneurysm is identified.  Posterior circulation: The intracranial vertebral arteries are widely patent to the basilar. The basilar artery is widely patent. There are small posterior communicating arteries bilaterally. PCAs are patent with mild branch vessel irregularity more notable on the right without evidence of flow limiting proximal stenosis. No aneurysm is identified. Venous sinuses: Patent. Anatomic variants: None. Review of the MIP images confirms the above findings CT Brain Perfusion Findings: ASPECTS: 4 CBF (<30%) Volume: 31mL Perfusion (Tmax>6.0s) volume: 24mL Mismatch Volume: 50mL, however the core infarct on noncontrast head CT is not being picked up by the automated processing and the true penumbra is less than 12 mL Infarction Location: Right MCA IMPRESSION: 1. Multiple missing right MCA branch vessels in the superior division with wide patency of the M1 segment and no discrete proximal M2 occlusion identified. 2. Widely patent common carotid, internal carotid, and vertebral arteries. 3. CT perfusion demonstrating at most a small penumbra as detailed above. These results were called by telephone at the time of interpretation on 02/14/2020 at 7:55 p.m. to Dr. Laurence Slate, who verbally acknowledged these results. Electronically Signed   By: Sebastian Ache M.D.   On: 02/14/2020 20:22   MR BRAIN WO CONTRAST  Result Date: 02/15/2020 CLINICAL DATA:  Stroke.  HIV. EXAM: MRI HEAD WITHOUT CONTRAST TECHNIQUE: Multiplanar, multiecho pulse sequences of the brain and surrounding structures were obtained without intravenous contrast. COMPARISON:  CT head 02/14/2020 FINDINGS: Brain: Acute right MCA infarct involving the right lenticular nucleus as well as the right frontal operculum and right parietal operculum. Small area of acute infarction in the genu internal capsule on the right. Small area of associated hemorrhage in the right frontal operculum and right parietal operculum. Small areas of acute infarct in the left basal ganglia involving the  genu internal capsule and posterior external capsule fibers. Ventricle size normal.  No midline shift.  Negative for mass lesion. Vascular: Normal arterial flow voids. Skull and upper cervical spine: No focal skeletal lesion. Sinuses/Orbits: Mild mucosal edema paranasal sinuses. Negative orbit Other: None IMPRESSION:  Acute infarct right MCA territory involving the basal ganglia as well as the right frontal and parietal operculum where there are small areas of associated hemorrhage. Small area of acute infarct in the left internal capsule and external capsule. Electronically Signed   By: Marlan Palau M.D.   On: 02/15/2020 07:27   CT CEREBRAL PERFUSION W CONTRAST  Result Date: 02/14/2020 CLINICAL DATA:  Stroke. Right-sided weakness and speech disturbance. EXAM: CT ANGIOGRAPHY HEAD AND NECK CT PERFUSION BRAIN TECHNIQUE: Multidetector CT imaging of the head and neck was performed using the standard protocol during bolus administration of intravenous contrast. Multiplanar CT image reconstructions and MIPs were obtained to evaluate the vascular anatomy. Carotid stenosis measurements (when applicable) are obtained utilizing NASCET criteria, using the distal internal carotid diameter as the denominator. Multiphase CT imaging of the brain was performed following IV bolus contrast injection. Subsequent parametric perfusion maps were calculated using RAPID software. CONTRAST:  40mL OMNIPAQUE IOHEXOL 350 MG/ML SOLN; 75mL OMNIPAQUE IOHEXOL 350 MG/ML SOLN COMPARISON:  None. FINDINGS: CTA NECK FINDINGS Aortic arch: Standard 3 vessel aortic arch with widely patent arch vessel origins. Right carotid system: Patent and smooth without evidence of stenosis or dissection. Left carotid system: Patent and smooth without evidence of stenosis or dissection. Vertebral arteries: Patent, smooth, and codominant without evidence of stenosis or dissection. Skeleton: Poor dentition with multiple periapical erosions. Other neck: Borderline  enlarged level II lymph nodes bilaterally. Upper chest: Centrilobular emphysema. Small superior mediastinal lymph nodes. Review of the MIP images confirms the above findings CTA HEAD FINDINGS Anterior circulation: The internal carotid arteries are widely patent from skull base to carotid termini. The A1 and M1 segments are widely patent with the left A1 segment being mildly hypoplastic. There is a decreased number of branch vessels in the right MCA superior division with suggestion of occlusion and intermittent reconstitution of distal M2 and M3 branches. There is also multifocal irregular narrowing of M3 branches in the right MCA inferior division, and there is mild left MCA branch vessel irregularity. No aneurysm is identified. Posterior circulation: The intracranial vertebral arteries are widely patent to the basilar. The basilar artery is widely patent. There are small posterior communicating arteries bilaterally. PCAs are patent with mild branch vessel irregularity more notable on the right without evidence of flow limiting proximal stenosis. No aneurysm is identified. Venous sinuses: Patent. Anatomic variants: None. Review of the MIP images confirms the above findings CT Brain Perfusion Findings: ASPECTS: 4 CBF (<30%) Volume: 0mL Perfusion (Tmax>6.0s) volume: 12mL Mismatch Volume: 12mL, however the core infarct on noncontrast head CT is not being picked up by the automated processing and the true penumbra is less than 12 mL Infarction Location: Right MCA IMPRESSION: 1. Multiple missing right MCA branch vessels in the superior division with wide patency of the M1 segment and no discrete proximal M2 occlusion identified. 2. Widely patent common carotid, internal carotid, and vertebral arteries. 3. CT perfusion demonstrating at most a small penumbra as detailed above. These results were called by telephone at the time of interpretation on 02/14/2020 at 7:55 p.m. to Dr. Laurence Slate, who verbally acknowledged these  results. Electronically Signed   By: Sebastian Ache M.D.   On: 02/14/2020 20:22   DG Chest Port 1 View  Result Date: 02/14/2020 CLINICAL DATA:  Acute CVA. Evaluate for pneumonia. EXAM: PORTABLE CHEST 1 VIEW COMPARISON:  Radiograph 07/07/2019. Lung apices from CT angiography earlier today. FINDINGS: Ill-defined opacity in the right mid lung abutting the minor fissure. Mild cardiomegaly. Normal mediastinal  contours. No pulmonary edema, large pleural effusion or pneumothorax. No acute osseous abnormalities are seen. IMPRESSION: Ill-defined opacity in the right mid lung abutting the minor fissure, suspicious for pneumonia. Mild cardiomegaly. Electronically Signed   By: Narda RutherfordMelanie  Sanford M.D.   On: 02/14/2020 21:58   DG Abd Portable 1V  Result Date: 02/15/2020 CLINICAL DATA:  MRI clearance EXAM: PORTABLE ABDOMEN - 1 VIEW COMPARISON:  None. FINDINGS: The bowel gas pattern is normal. No radio-opaque calculi or other significant radiographic abnormality are seen. IMPRESSION: No radial pain metallic foreign body in the abdomen or pelvis. Electronically Signed   By: Elige KoHetal  Patel   On: 02/15/2020 05:57   VAS US TRANSCRANIAL DOPPLER W BUBBLES  Result Date: 02/17/2020  Transcranial Doppler with Bubble Indications: PFO. Comparison Study: No prior studies. Performing Technologist: Chanda BusingGregory Collins RVT  Examination Guidelines: A complete evaluation includes B-mode imaging, spectral Doppler, color Doppler, and power Doppler as needed of all accessible portions of each vessel. Bilateral testing is considered an integral part of a complete examination. Limited examinations for reoccurring indications may be performed as noted.  Summary:  A vascular evaluation was performed. The right P2 was studied. An IV was inserted into the patient's right Brachial. Verbal informed consent was obtained. No HITS. Negative for PFO.  Negative TCD Bubble study .No indication of right to left shunt noted. *See table(s) above for TCD measurements  and observations.  Diagnosing physician: Delia HeadyPramod Sethi MD Electronically signed by Delia HeadyPramod Sethi MD on 02/17/2020 at 12:36:13 PM.    Final    CT HEAD CODE STROKE WO CONTRAST  Result Date: 02/14/2020 CLINICAL DATA:  Code stroke.  Right-sided weakness. EXAM: CT HEAD WITHOUT CONTRAST TECHNIQUE: Contiguous axial images were obtained from the base of the skull through the vertex without intravenous contrast. COMPARISON:  03/07/2014 FINDINGS: Brain: There is an acute right basal ganglia infarct involving the caudate nucleus, lentiform nucleus, and a portion of the anterior limb of the right internal capsule. There is a subcentimeter focus of acute hemorrhage superficially in the right frontal operculum with a small amount of surrounding low-density, and there is also loss of gray-white differentiation indicative of an acute infarct in the anterior aspect of the right insula and more anterior aspect of the right frontal lobe. There is a new age indeterminate lacunar infarct in the genu of the left internal capsule. The ventricles are normal in size. There is no midline shift or extra-axial fluid collection. Vascular: Hyperdense right MCA. Skull: No fracture or suspicious osseous lesion. Sinuses/Orbits: Partially visualized small mucous retention cyst in the right maxillary sinus. Clear mastoid air cells. Rightward gaze. Other: None. ASPECTS [facility]([place] Stroke Program Early CT Score) - Ganglionic level infarction (caudate, lentiform nuclei, internal capsule, insula, M1-M3 cortex): 1 - Supraganglionic infarction (M4-M6 cortex): 3 Total score (0-10 with 10 being normal): 4 IMPRESSION: 1. 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 2. ASPECTS is 4. 3. 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 These results were communicated to Dr. Laurence SlateAroor at 7:29 pmon 02/14/2020 by text page via the [facility] messaging system.  Electronically Signed   By: Sebastian AcheAllen  Grady M.D.   On: 02/14/2020 19:30   ECHOCARDIOGRAM LIMITED  Result Date: 02/15/2020    ECHOCARDIOGRAM LIMITED REPORT   Patient Name:   Tyler MemosGABRIEL Vargas Date of Exam: 02/15/2020 Medical Rec #:  [id number]      Height:  68.0 in Accession #:    [id number]     Weight:       152.1 lb Date of Birth:  Dec 27, 1985       BSA:          1.819 m Patient Age:    33 years       BP:           139/86 mmHg Patient Gender: M              HR:           78 bpm. Exam Location:  Inpatient Procedure: Limited Echo, Limited Color Doppler and Cardiac Doppler Indications:    Stroke 434.91 / I163.9                 Endocarditis I38  History:        Patient has no prior history of Echocardiogram examinations.                 HIV.  Sonographer:    Leta Jungling RDCS Referring Phys: [id number] TIMOTHY S OPYD IMPRESSIONS  1. Left ventricular ejection fraction, by estimation, is 55 to 60%. The left ventricle has normal function. The left ventricle has no regional wall motion abnormalities. Left ventricular diastolic parameters were normal.  2. The mitral valve is grossly normal. Trivial mitral valve regurgitation. No evidence of mitral stenosis.  3. The aortic valve is tricuspid. Aortic valve regurgitation is not visualized. No aortic stenosis is present.  4. There is normal pulmonary artery systolic pressure. The estimated right ventricular systolic pressure is 24.0 mmHg.  5. The inferior vena cava is normal in size with greater than 50% respiratory variability, suggesting right atrial pressure of 3 mmHg. Conclusion(s)/Recommendation(s): No evidence of valvular vegetations on this transthoracic echocardiogram. Would recommend a transesophageal echocardiogram to exclude infective endocarditis if clinically indicated. FINDINGS  Left Ventricle: Left ventricular ejection fraction, by estimation, is 55 to 60%. The left ventricle has normal function. The left ventricle has no regional wall motion abnormalities. The  left ventricular internal cavity size was normal in size. There is  no left ventricular hypertrophy. Right Ventricle: There is normal pulmonary artery systolic pressure. The tricuspid regurgitant velocity is 2.29 m/s, and with an assumed right atrial pressure of 3 mmHg, the estimated right ventricular systolic pressure is 24.0 mmHg. Left Atrium: Left atrial size was normal in size. Right Atrium: Right atrial size was normal in size. Pericardium: A small pericardial effusion is present. The pericardial effusion is circumferential. Mitral Valve: The mitral valve is grossly normal. Trivial mitral valve regurgitation. No evidence of mitral valve stenosis. There is no evidence of mitral valve vegetation. Tricuspid Valve: The tricuspid valve is grossly normal. Tricuspid valve regurgitation is trivial. No evidence of tricuspid stenosis. There is no evidence of tricuspid valve vegetation. Aortic Valve: The aortic valve is tricuspid. Aortic valve regurgitation is not visualized. No aortic stenosis is present. There is no evidence of aortic valve vegetation. Pulmonic Valve: The pulmonic valve was grossly normal. Pulmonic valve regurgitation is trivial. No evidence of pulmonic stenosis. There is no evidence of pulmonic valve vegetation. Venous: The inferior vena cava is normal in size with greater than 50% respiratory variability, suggesting right atrial pressure of 3 mmHg. IAS/Shunts: The atrial septum is grossly normal.  LEFT VENTRICLE PLAX 2D LVIDd:         5.00 cm      Diastology LVIDs:         3.50 cm  LV e' lateral:   12.70 cm/s LV PW:         1.00 cm      LV E/e' lateral: 5.0 LV IVS:        1.00 cm      LV e' medial:    12.50 cm/s                             LV E/e' medial:  5.1  LV Volumes (MOD) LV vol d, MOD A2C: 151.0 ml LV vol d, MOD A4C: 177.0 ml LV vol s, MOD A2C: 73.1 ml LV vol s, MOD A4C: 68.9 ml LV SV MOD A2C:     77.9 ml LV SV MOD A4C:     177.0 ml LV SV MOD BP:      94.8 ml LEFT ATRIUM             Index        RIGHT ATRIUM           Index LA diam:        3.30 cm 1.81 cm/m  RA Area:     14.30 cm LA Vol (A2C):   54.3 ml 29.85 ml/m RA Volume:   36.30 ml  19.95 ml/m LA Vol (A4C):   44.8 ml 24.62 ml/m LA Biplane Vol: 53.9 ml 29.63 ml/m  AORTIC VALVE LVOT Vmax:   104.00 cm/s LVOT Vmean:  72.900 cm/s LVOT VTI:    0.168 m  AORTA Ao Root diam: 3.50 cm MITRAL VALVE               TRICUSPID VALVE MV Area (PHT): 2.66 cm    TR Peak grad:   21.0 mmHg MV Decel Time: 285 msec    TR Vmax:        229.00 cm/s MV E velocity: 63.40 cm/s MV A velocity: 75.80 cm/s  SHUNTS MV E/A ratio:  0.84        Systemic VTI: 0.17 m Lennie Odor MD Electronically signed by Lennie Odor MD Signature Date/Time: 02/15/2020/1:27:05 PM    Final

## 2020-02-19 ENCOUNTER — Inpatient Hospital Stay (HOSPITAL_COMMUNITY): Payer: Medicaid Other

## 2020-02-19 DIAGNOSIS — R4182 Altered mental status, unspecified: Secondary | ICD-10-CM

## 2020-02-19 DIAGNOSIS — R509 Fever, unspecified: Secondary | ICD-10-CM

## 2020-02-19 DIAGNOSIS — Z8673 Personal history of transient ischemic attack (TIA), and cerebral infarction without residual deficits: Secondary | ICD-10-CM

## 2020-02-19 LAB — CBC
HCT: 34.6 % — ABNORMAL LOW (ref 39.0–52.0)
HCT: 35.2 % — ABNORMAL LOW (ref 39.0–52.0)
Hemoglobin: 11.1 g/dL — ABNORMAL LOW (ref 13.0–17.0)
Hemoglobin: 11.3 g/dL — ABNORMAL LOW (ref 13.0–17.0)
MCH: 29.1 pg (ref 26.0–34.0)
MCH: 29.4 pg (ref 26.0–34.0)
MCHC: 32.1 g/dL (ref 30.0–36.0)
MCHC: 32.1 g/dL (ref 30.0–36.0)
MCV: 90.8 fL (ref 80.0–100.0)
MCV: 91.7 fL (ref 80.0–100.0)
Platelets: 690 10*3/uL — ABNORMAL HIGH (ref 150–400)
Platelets: 710 10*3/uL — ABNORMAL HIGH (ref 150–400)
RBC: 3.81 MIL/uL — ABNORMAL LOW (ref 4.22–5.81)
RBC: 3.84 MIL/uL — ABNORMAL LOW (ref 4.22–5.81)
RDW: 15.2 % (ref 11.5–15.5)
RDW: 15.6 % — ABNORMAL HIGH (ref 11.5–15.5)
WBC: 8.2 10*3/uL (ref 4.0–10.5)
WBC: 12.7 10*3/uL — ABNORMAL HIGH (ref 4.0–10.5)
nRBC: 0 % (ref 0.0–0.2)
nRBC: 0 % (ref 0.0–0.2)

## 2020-02-19 LAB — CULTURE, BLOOD (ROUTINE X 2)
Culture: NO GROWTH
Culture: NO GROWTH
Special Requests: ADEQUATE
Special Requests: ADEQUATE

## 2020-02-19 LAB — COMPREHENSIVE METABOLIC PANEL
ALT: 29 U/L (ref 0–44)
AST: 60 U/L — ABNORMAL HIGH (ref 15–41)
Albumin: 1.9 g/dL — ABNORMAL LOW (ref 3.5–5.0)
Alkaline Phosphatase: 57 U/L (ref 38–126)
Anion gap: 12 (ref 5–15)
BUN: 13 mg/dL (ref 6–20)
CO2: 23 mmol/L (ref 22–32)
Calcium: 8.4 mg/dL — ABNORMAL LOW (ref 8.9–10.3)
Chloride: 101 mmol/L (ref 98–111)
Creatinine, Ser: 0.82 mg/dL (ref 0.61–1.24)
GFR calc Af Amer: >60 mL/min (ref >60)
GFR calc non Af Amer: >60 mL/min (ref >60)
Glucose, Bld: 136 mg/dL — ABNORMAL HIGH (ref 70–99)
Potassium: 3.6 mmol/L (ref 3.5–5.1)
Sodium: 136 mmol/L (ref 135–145)
Total Bilirubin: 0.3 mg/dL (ref 0.3–1.2)
Total Protein: 7.5 g/dL (ref 6.5–8.1)

## 2020-02-19 LAB — DIFFERENTIAL
Abs Immature Granulocytes: 0.16 10*3/uL — ABNORMAL HIGH (ref 0.00–0.07)
Basophils Absolute: 0 10*3/uL (ref 0.0–0.1)
Basophils Relative: 0 %
Eosinophils Absolute: 0 10*3/uL (ref 0.0–0.5)
Eosinophils Relative: 0 %
Immature Granulocytes: 1 %
Lymphocytes Relative: 6 %
Lymphs Abs: 0.7 10*3/uL (ref 0.7–4.0)
Monocytes Absolute: 0.2 10*3/uL (ref 0.1–1.0)
Monocytes Relative: 1 %
Neutro Abs: 11.6 10*3/uL — ABNORMAL HIGH (ref 1.7–7.7)
Neutrophils Relative %: 92 %

## 2020-02-19 LAB — GLUCOSE, CAPILLARY
Glucose-Capillary: 102 mg/dL — ABNORMAL HIGH (ref 70–99)
Glucose-Capillary: 103 mg/dL — ABNORMAL HIGH (ref 70–99)
Glucose-Capillary: 105 mg/dL — ABNORMAL HIGH (ref 70–99)
Glucose-Capillary: 116 mg/dL — ABNORMAL HIGH (ref 70–99)
Glucose-Capillary: 124 mg/dL — ABNORMAL HIGH (ref 70–99)
Glucose-Capillary: 130 mg/dL — ABNORMAL HIGH (ref 70–99)
Glucose-Capillary: 70 mg/dL (ref 70–99)
Glucose-Capillary: 90 mg/dL (ref 70–99)

## 2020-02-19 LAB — FERRITIN: Ferritin: 713 ng/mL — ABNORMAL HIGH (ref 24–336)

## 2020-02-19 LAB — D-DIMER, QUANTITATIVE: D-Dimer, Quant: 2.94 ug/mL-FEU — ABNORMAL HIGH (ref 0.00–0.50)

## 2020-02-19 LAB — C-REACTIVE PROTEIN: CRP: 2 mg/dL — ABNORMAL HIGH (ref <1.0)

## 2020-02-19 MED ORDER — SULFAMETHOXAZOLE-TRIMETHOPRIM 200-40 MG/5ML PO SUSP
20.0000 mL | Freq: Every day | ORAL | Status: DC
  Administered 2020-02-19 – 2020-02-24 (×6): 20 mL
  Filled 2020-02-19 (×6): qty 20

## 2020-02-19 MED ORDER — ASPIRIN 325 MG PO TABS
325.0000 mg | ORAL_TABLET | Freq: Every day | ORAL | Status: DC
  Administered 2020-02-19 – 2020-02-24 (×6): 325 mg via NASOGASTRIC
  Filled 2020-02-19 (×6): qty 1

## 2020-02-19 MED ORDER — ABACAVIR-DOLUTEGRAVIR-LAMIVUD 600-50-300 MG PO TABS
1.0000 | ORAL_TABLET | Freq: Every day | ORAL | Status: DC
  Administered 2020-02-19 – 2020-02-24 (×6): 1 via ORAL
  Filled 2020-02-19 (×6): qty 1

## 2020-02-19 NOTE — Plan of Care (Signed)
  Problem: Education: Goal: Knowledge of General Education information will improve Description: Including pain rating scale, medication(s)/side effects and non-pharmacologic comfort measures Outcome: Progressing   Problem: Health Behavior/Discharge Planning: Goal: Ability to manage health-related needs will improve Outcome: Progressing   Problem: Clinical Measurements: Goal: Ability to maintain clinical measurements within normal limits will improve Outcome: Progressing Goal: Will remain free from infection Outcome: Progressing Goal: Diagnostic test results will improve Outcome: Progressing Goal: Respiratory complications will improve Outcome: Progressing Goal: Cardiovascular complication will be avoided Outcome: Progressing   Problem: Activity: Goal: Risk for activity intolerance will decrease Outcome: Progressing   Problem: Nutrition: Goal: Adequate nutrition will be maintained Outcome: Progressing   Problem: Coping: Goal: Level of anxiety will decrease Outcome: Progressing   Problem: Elimination: Goal: Will not experience complications related to bowel motility Outcome: Progressing Goal: Will not experience complications related to urinary retention Outcome: Progressing   Problem: Pain Managment: Goal: General experience of comfort will improve Outcome: Progressing   Problem: Safety: Goal: Ability to remain free from injury will improve Outcome: Progressing   Problem: Skin Integrity: Goal: Risk for impaired skin integrity will decrease Outcome: Progressing   Problem: Education: Goal: Knowledge of disease or condition will improve Outcome: Progressing Goal: Knowledge of secondary prevention will improve Outcome: Progressing Goal: Individualized Educational Video(s) Outcome: Progressing   Problem: Coping: Goal: Will verbalize positive feelings about self Outcome: Progressing Goal: Will identify appropriate support needs Outcome: Progressing    Problem: Health Behavior/Discharge Planning: Goal: Ability to manage health-related needs will improve Outcome: Progressing   

## 2020-02-19 NOTE — Progress Notes (Signed)
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 Pt aphasic and initially not following commands in ED, eventually able to follow commands but not provide history. 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 Acute CVA thought to be secondary to hypercoagulable state from Covid 19 and cocaine use. Pt also + COVID. Admitted 02/14/20 for treatment of acute CVA.  Echo without embolic source, CTA neck without any significant extracranial stenosis, transcranial Doppler negative for PFO. Neurology recommends to continue aspirin. Patient on Enoxaparin for DVT prophylaxis.    OT comments  Pt received in bed, awake and appeared to attempt to verbalize to OT. Pt overall unable to answer questions or follow commands today. Pt Total A for wash face, attempting HOH and tactile cues did not facilitate improved initiation. Pt noted with excessive secretions - compliant with OT providing suction for safety. Pt unable to follow AAROM exercises with OT assist of B UE, unable to voluntarily squeeze OT's hands when cued. Will continue to follow acutely.   Follow Up Recommendations  SNF    Equipment Recommendations  Other (comment)(TBD)    Recommendations for Other Services      Precautions / Restrictions Precautions Precautions: Fall;Other (comment) Precaution Comments: Aspiration precautions Restrictions Weight Bearing Restrictions: No       Mobility Bed Mobility                   Transfers Overall transfer level: Needs assistance               General transfer comment: unable to assess on this date    Balance                                           ADL either performed or assessed with clinical judgement   ADL Overall ADL's : Needs assistance/impaired Eating/Feeding: Total assistance(feeding tube)   Grooming: Total assistance;Wash/dry face;Bed level Grooming Details (indicate cue type and reason): Total A to wash face given HOH, tactile cues but unable to initiate today                               General ADL Comments: overall totalA for self care      Vision       Perception     Praxis      Cognition Arousal/Alertness: Lethargic Behavior During Therapy: Flat affect Overall Cognitive Status: Difficult to assess                                 General Comments: Eyes open (R more than L ) but unable to nod head or provide reliable yes/no responses.        Exercises     Shoulder Instructions       General Comments VSS on RA    Pertinent Vitals/ Pain  Pain Assessment: Faces Faces Pain Scale: No hurt  Home Living                                          Prior Functioning/Environment              Frequency  Min 2X/week        Progress Toward Goals  OT Goals(current goals can now be found in the care plan section)  Progress towards OT goals: Progressing toward goals  Acute Rehab OT Goals Patient Stated Goal: none stated OT Goal Formulation: Patient unable to participate in goal setting Time For Goal Achievement: 16-Mar-2020 Potential to Achieve Goals: Good ADL Goals Pt Will Perform Grooming: with min assist;sitting;bed level Pt Will Perform Upper Body Dressing: with min assist;sitting;bed level Pt/caregiver will Perform Home Exercise Program: Increased strength;Increased ROM;Both right and left upper extremity;With written HEP provided;With  minimal assist Additional ADL Goal #1: Pt will follow 1 step commands with 50% accuracy during functional task. Additional ADL Goal #2: Pt will remain awake/sustain attention to functional task >5 min with no more than min cueing.  Plan Discharge plan remains appropriate    Co-evaluation          OT goals addressed during session: ADL's and self-care      AM-PAC OT "6 Clicks" Daily Activity     Outcome Measure   Help from another person eating meals?: Total Help from another person taking care of personal grooming?: Total Help from another person toileting, which includes using toliet, bedpan, or urinal?: Total Help from another person bathing (including washing, rinsing, drying)?: Total Help from another person to put on and taking off regular upper body clothing?: Total Help from another person to put on and taking off regular lower body clothing?: Total 6 Click Score: 6    End of Session    OT Visit Diagnosis: Muscle weakness (generalized) (M62.81);Hemiplegia and hemiparesis;Cognitive communication deficit (R41.841) Symptoms and signs involving cognitive functions: Cerebral infarction   Activity Tolerance Patient limited by lethargy   Patient Left in bed;with call bell/phone within reach;with bed alarm set   Nurse Communication Mobility status        Time: 4270-6237 OT Time Calculation (min): 20 min  Charges: OT General Charges $OT Visit: 1 Visit OT Treatments $Therapeutic Activity: 8-22 mins  Lorre Munroe, OTR/L   Lorre Munroe 02/19/2020, 11:25 AM

## 2020-02-19 NOTE — Progress Notes (Signed)
PROGRESS NOTE                                                                                                                                                                                                             Patient Demographics:    Tyler Vargas, is a 34 y.o. adult, DOB - [birth year], ZOX:[id number]  Outpatient Primary MD for the patient is Patient, No Pcp Per   Admit date - 02/14/2020   LOS - 5  Chief Complaint  Patient presents with  . Code Stroke       Brief Narrative: Patient is a 34 y.o. 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  See below for further details.  Significant Events: 3/27>> admit to [facility] for acute CVA. 3/31>>cortak tube inserted  COVID-19 medications: Steroids: 3/28>>4/1 Remdesivir: 3/28>>4/1  Antibiotics: Penicillin G: 3/27>> 3/28  Microbiology data: 4/1>>blood culture:pending 3/27>> Blood culture: negative 3/27>> CSF culture: Negative 3/27>> urine culture: Multiple morphotypes 3/27>> VDRL CSF: Nonreactive 3/27>> CSF cryptococcal antigen negative  DVT prophylaxis: SQ Lovenox  Procedures: 3/27 >>lumbar puncture: By ED MD  Consults: ID Neuro    Subjective:   He appears more encephalopathic compared to yesterday-not tracking my movement.  Gaze is mostly deviated to the right-although at times he will look at me straight.  He will blink when startled.  Low-grade fever last night-but febrile to 101.9 F this morning.   Assessment  & Plan :   Acute CVA: Remains aphasic-exam is very difficult because of encephalopathy-he continues to have obvious left-sided weakness-but is really not moving his right side today.  He is not tracking my movement as yesterday-eyes are mostly deviated to the right.  Had completed stroke work-up-CTA neck without any significant extracranial  stenosis, echo without any obvious embolic source, transcranial Doppler negative for PFO.  Given ongoing encephalopathy-somewhat of a different exam today compared to yesterday-I have ordered a CT head and EEG.  Have spoken with Dr. Martina Sinner this patient has had waxing and waning encephalopathy in the past as well.    Dysphagia: Secondary to above-Cortak tube inserted on 3/31-started NG tube feedings.  If dysphagia persists-likely will need PEG tube at some point.  Covid 19 Viral pneumonia: Remains stable on room air-inflammatory markers downtrending-completes steroids and remdesivir on 4/1.  However febrile this morning-see discussion below.  Fever: afebrile  O2 requirements:  SpO2: 96 %   COVID-19 Labs: Recent Labs    02/17/20 0313 02/17/20 0550 02/18/20 0336 02/19/20 0450  DDIMER 2.66*  --  2.25* 2.94*  FERRITIN  --  830* 695* 713*  CRP  --  3.5* 2.2* 2.0*    No results found for: BNP  No results for input(s): PROCALCITON in the last 168 hours.  Lab Results  Component Value Date   SARSCOV2NAA POSITIVE (A) 02/14/2020    ?  Aseptic meningitis: CSF with elevated protein levels-and slight leukocytosis-mostly lymphocytes.  CSF cultures negative-VDRL CSF negative-CSF cryptococcal antigen negative.  Now with fever-more encephalopathic-repeating CT head and checking spot EEG.  Fever: Febrile to 101.9 degrees this morning-chest x-ray this morning with no new infiltrates-given dysphagia/encephalopathy he remains at risk for aspiration pneumonia.  Inflammatory markers related to Covid seems better.  His right arm swelling-seems much better compared to yesterday.  Per nursing staff-no obvious wounds evident-no diarrhea as well.  Repeat blood cultures today-given advanced HIV-with a CD4 count of 81-we will discuss with ID-before starting patient on antimicrobial therapy.  Acute metabolic encephalopathy: Really not following commands-remains aphasic-has CVA-appears to have some amount  of aseptic meningitis-now febrile.  Repeat CT head this morning pending.  EEG pending.  Per my discussion with stroke MD-Dr. Pearlean Brownie on 4/1-patient has had waxing and waning encephalopathy during the early part of his hospital stay as well.  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  History of syphilis: CSF VDRL negative-not likely to have neurosyphilis as the cause of CVA.  No longer on penicillin G.   Cocaine use: We will counsel when more awake and alert  Right upper extremity swelling: Likely secondary to infiltrated IV-Doppler ultrasound on 3/31 neg for DVT.  Nutrition Problem: Nutrition Problem: Increased nutrient needs Etiology: chronic illness(HIV/AIDS) Signs/Symptoms: estimated needs Interventions: Tube feeding, Prostat  ABG:    Component Value Date/Time   PHART 7.254 (L) 03/07/2014 1019   PCO2ART 40.7 03/07/2014 1019   PO2ART 107.0 (H) 03/07/2014 1019   HCO3 18.0 (L) 03/07/2014 1019   TCO2 27 02/14/2020 1911   ACIDBASEDEF 9.0 (H) 03/07/2014 1019   O2SAT 97.0 03/07/2014 1019    Vent Settings: N/A  Condition -extremely guarded  Family Communication  : Left a voicemail for father on 4/1  Code Status :  Full Code  Diet :  Diet Order            Diet NPO time specified  Diet effective now               Disposition Plan  :  Remain hospitalized  Barriers to discharge: Complete 5 days of IV Remdesivir  Antimicorbials  :    Anti-infectives (From admission, onward)   Start     Dose/Rate Route Frequency Ordered Stop   02/19/20 1000  sulfamethoxazole-trimethoprim (BACTRIM) 200-40 MG/5ML suspension 20 mL     20 mL Per Tube Daily 02/19/20 0901     02/17/20 0000  bictegravir-emtricitabine-tenofovir AF (BIKTARVY) 50-200-25 MG TABS tablet     1 tablet Oral Daily 02/17/20 1250 03/18/20 2359   02/16/20 1000  remdesivir 100 mg in sodium chloride 0.9 % 100 mL IVPB     100 mg 200 mL/hr over 30 Minutes  Intravenous Daily 02/15/20 1016 02/19/20 0858   02/15/20 1100  remdesivir 200 mg in sodium chloride 0.9% 250 mL IVPB  200 mg 580 mL/hr over 30 Minutes Intravenous Once 02/15/20 1016 02/15/20 1150   02/15/20 1000  bictegravir-emtricitabine-tenofovir AF (BIKTARVY) 50-200-25 MG per tablet 1 tablet    Note to Pharmacy: Try to take at the same time each day with or without food.     1 tablet Oral Daily 02/14/20 2231     02/15/20 1000  sulfamethoxazole-trimethoprim (BACTRIM DS) 800-160 MG per tablet 1 tablet  Status:  Discontinued     1 tablet Oral Daily 02/14/20 2231 02/19/20 0901   02/15/20 0530  vancomycin (VANCOCIN) IVPB 1000 mg/200 mL premix  Status:  Discontinued     1,000 mg 200 mL/hr over 60 Minutes Intravenous Every 8 hours 02/14/20 2120 02/15/20 0957   02/14/20 2215  penicillin G potassium 4 Million Units in dextrose 5 % 250 mL IVPB  Status:  Discontinued     4 Million Units 250 mL/hr over 60 Minutes Intravenous Every 4 hours 02/14/20 2202 02/15/20 1653   02/14/20 2200  ceFEPIme (MAXIPIME) 2 g in sodium chloride 0.9 % 100 mL IVPB  Status:  Discontinued     2 g 200 mL/hr over 30 Minutes Intravenous Every 8 hours 02/14/20 2120 02/14/20 2145   02/14/20 2130  vancomycin (VANCOREADY) IVPB 1500 mg/300 mL     1,500 mg 150 mL/hr over 120 Minutes Intravenous  Once 02/14/20 2120 02/15/20 0044   02/14/20 2115  ceFEPIme (MAXIPIME) 2 g in sodium chloride 0.9 % 100 mL IVPB  Status:  Discontinued     2 g 200 mL/hr over 30 Minutes Intravenous  Once 02/14/20 2103 02/14/20 2120   02/14/20 2115  metroNIDAZOLE (FLAGYL) IVPB 500 mg  Status:  Discontinued     500 mg 100 mL/hr over 60 Minutes Intravenous  Once 02/14/20 2103 02/14/20 2120   02/14/20 2115  vancomycin (VANCOCIN) IVPB 1000 mg/200 mL premix  Status:  Discontinued     1,000 mg 200 mL/hr over 60 Minutes Intravenous  Once 02/14/20 2103 02/14/20 2120      Inpatient Medications  Scheduled Meds: .  stroke: mapping our early stages of  recovery book   Does not apply Once  . aspirin  325 mg Per NG tube Daily  . bictegravir-emtricitabine-tenofovir AF  1 tablet Oral Daily  . dexamethasone (DECADRON) injection  6 mg Intravenous Q24H  . enoxaparin (LOVENOX) injection  40 mg Subcutaneous Q24H  . feeding supplement (PRO-STAT SUGAR FREE 64)  30 mL Per Tube BID  . influenza vac split quadrivalent PF  0.5 mL Intramuscular Tomorrow-1000  . pneumococcal 23 valent vaccine  0.5 mL Intramuscular Tomorrow-1000  . sulfamethoxazole-trimethoprim  20 mL Per Tube Daily   Continuous Infusions: . dextrose 5% lactated ringers 10 mL/hr at 02/19/20 1610  . feeding supplement (OSMOLITE 1.5 CAL) 55 mL/hr at 02/19/20 0531   PRN Meds:.acetaminophen **OR** acetaminophen (TYLENOL) oral liquid 160 mg/5 mL **OR** acetaminophen, senna-docusate   Time Spent in minutes  25  See all Orders from today for further details   Jeoffrey Massed M.D on 02/19/2020 at 10:54 AM  To page go to [email or web address] - use universal password  [facility] -  Office  [phone]    Objective:   Vitals:   02/19/20 0500 02/19/20 0510 02/19/20 0634 02/19/20 0729  BP:  129/88  120/72  Pulse:  93  88  Resp:  (!) 24 (!) 21 (!) 24  Temp:  (!) 100.6 F (38.1 C) 100.1 F (37.8 C) (!) 101.9 F (38.8 C)  TempSrc:  Oral  Axillary  SpO2:  97%  96%  Weight: 64.3 kg     Height:        Wt Readings from Last 3 Encounters:  02/19/20 64.3 kg  01/28/20 69.9 kg  10/30/19 57.2 kg     Intake/Output Summary (Last 24 hours) at 02/19/2020 1054 Last data filed at 02/19/2020 0600 Gross per 24 hour  Intake 2141.44 ml  Output 720 ml  Net 1421.44 ml     Physical Exam Gen Exam: Aphasic-not in any distress-does not track my movements today.  Gaze mostly towards the right but at times will look straight with me. HEENT:atraumatic, normocephalic Chest: B/L clear to auscultation anteriorly CVS:S1S2 regular Abdomen:soft non tender, non distended Extremities:no edema Neurology:  Very difficult exam given encephalopathy-really not moving any of his extremities today. Skin: no rash   Data Review:    CBC Recent Labs  Lab 02/14/20 1905 02/14/20 1911 02/19/20 0450  WBC 11.4*  --  8.2  HGB 10.6* 11.2* 11.1*  HCT 35.0* 33.0* 34.6*  PLT 561*  --  690*  MCV 95.4  --  90.8  MCH 28.9  --  29.1  MCHC 30.3  --  32.1  RDW 15.7*  --  15.2  LYMPHSABS 0.8  --   --   MONOABS 0.5  --   --   EOSABS 0.0  --   --   BASOSABS 0.1  --   --     Chemistries  Recent Labs  Lab 02/15/20 0052 02/16/20 0828 02/17/20 0313 02/18/20 0336 02/19/20 0450  NA 132* 135 136 136 136  K 3.7 3.8 4.5 3.8 3.6  CL 97* 101 99 103 101  CO2 GLUCOSE 111* 102* 102* 371* 136*  BUN CREATININE 0.88 0.71 0.78 0.75 0.82  CALCIUM 8.6* 8.7* 9.0 8.6* 8.4*  AST 43* 60*  ALT ALKPHOS 55 43 48 41 57  BILITOT 0.7 0.6 0.3 <0.1* 0.3   ------------------------------------------------------------------------------------------------------------------ No results for input(s): CHOL, HDL, LDLCALC, TRIG, CHOLHDL, LDLDIRECT in the last 72 hours.  Lab Results  Component Value Date   HGBA1C 5.9 (H) 02/15/2020   ------------------------------------------------------------------------------------------------------------------ No results for input(s): TSH, T4TOTAL, T3FREE, THYROIDAB in the last 72 hours.  Invalid input(s): FREET3 ------------------------------------------------------------------------------------------------------------------ Recent Labs    02/18/20 0336 02/19/20 0450  FERRITIN 695* 713*    Coagulation profile Recent Labs  Lab 02/14/20 1905  INR 1.1    Recent Labs    02/18/20 0336 02/19/20 0450  DDIMER 2.25* 2.94*    Cardiac Enzymes No results for input(s): CKMB, TROPONINI, MYOGLOBIN in the last 168 hours.  Invalid input(s):  CK ------------------------------------------------------------------------------------------------------------------ No results found for: BNP  Micro Results Recent Results (from the past 240 hour(s))  Culture, blood (Routine X 2) w Reflex to ID Panel     Status: None   Collection Time: 02/14/20  9:40 PM   Specimen: BLOOD  Result Value Ref Range Status   Specimen Description BLOOD RIGHT ARM  Final   Special Requests   Final    BOTTLES DRAWN AEROBIC AND ANAEROBIC Blood Culture adequate volume   Culture   Final    NO GROWTH 5 DAYS Performed at [facility], [address], [place], [place] [address]    Report Status 02/19/2020 FINAL  Final  Culture, blood (Routine X 2) w Reflex to ID Panel     Status: None   Collection Time: 02/14/20  9:46  PM   Specimen: BLOOD  Result Value Ref Range Status   Specimen Description BLOOD RIGHT FOREARM  Final   Special Requests   Final    BOTTLES DRAWN AEROBIC AND ANAEROBIC Blood Culture adequate volume   Culture   Final    NO GROWTH 5 DAYS Performed at [facility], [address], [place], [place] [address]    Report Status 02/19/2020 FINAL  Final  Urine culture     Status: Abnormal   Collection Time: 02/14/20  9:46 PM   Specimen: In/Out Cath Urine  Result Value Ref Range Status   Specimen Description IN/OUT CATH URINE  Final   Special Requests   Final    NONE Performed at [facility], [address], [place], [place] [address]    Culture MULTIPLE SPECIES PRESENT, SUGGEST RECOLLECTION (A)  Final   Report Status 02/16/2020 FINAL  Final  SARS CORONAVIRUS 2 (TAT 6-24 HRS)     Status: Abnormal   Collection Time: 02/14/20 10:55 PM  Result Value Ref Range Status   SARS Coronavirus 2 POSITIVE (A) NEGATIVE Final    Comment: RESULT CALLED TO, READ BACK BY AND VERIFIED WITH: K. GUY,RN 0335 02/15/2020 T. TYSOR (NOTE) SARS-CoV-2 target nucleic acids are DETECTED. The SARS-CoV-2 RNA is generally detectable in upper and  lower respiratory specimens during the acute phase of infection. Positive results are indicative of the presence of SARS-CoV-2 RNA. Clinical correlation with patient history and other diagnostic information is  necessary to determine patient infection status. Positive results do not rule out bacterial infection or co-infection with other viruses.  The expected result is Negative. Fact Sheet for Patients: HairSlick.no Fact Sheet for Healthcare Providers: quierodirigir.com This test is not yet approved or cleared by the [place] FDA and  has been authorized for detection and/or diagnosis of SARS-CoV-2 by FDA under an Emergency Use Authorization (EUA). This EUA will remain  in effect (meaning this test can be used) for the  duration of the COVID-19 declaration under Section 564(b)(1) of the Act, 21 U.S.C. section 360bbb-3(b)(1), unless the authorization is terminated or revoked sooner. Performed at [facility], [address] [place], [place] [address]   CSF culture with Stat gram stain     Status: None   Collection Time: 02/14/20 11:35 PM   Specimen: CSF; Cerebrospinal Fluid  Result Value Ref Range Status   Specimen Description CSF  Final   Special Requests NONE  Final   Gram Stain   Final    CYTOSPIN SMEAR WBC PRESENT,BOTH PMN AND MONONUCLEAR NO ORGANISMS SEEN    Culture   Final    NO GROWTH 3 DAYS Performed at [facility], [address], [place], [place] [address]    Report Status 02/18/2020 FINAL  Final    Radiology Reports EEG  Result Date: 02/15/2020 Charlsie Quest, MD     02/15/2020  4:13 PM Patient Name: Davarious Tumbleson MRN: [id number] Epilepsy Attending: Charlsie Quest Referring Physician/Provider: Dr Georgiana Spinner A[place]or Date: 02/15/2020 Duration: 23.26 mins Patient history: 33y male with HIV's, syphilis, cocaine abuse brought to ED by EMS for generalized weakness x 2 days and developed sudden  onset aphasia, possible right side weakness.  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 EEG to evaluate for seizure Level of alertness: asleep AEDs during EEG study: None Technical aspects: This EEG study was done with scalp electrodes positioned according to the  10-20 International system of electrode placement. Electrical activity was acquired at a sampling rate of 500Hz  and reviewed with a high frequency filter of 70Hz  and a low frequency filter of 1Hz . EEG data were recorded continuously and digitally stored. DESCRIPTION:  Sleep was characterized by vertex waves, sleep spindles, maximal frontocentral. Hyperventilation and photic stimulation were not performed. IMPRESSION: This study during sleep only is within normal limits. No seizures or epileptiform discharges were seen throughout the recording. If suspicion for interictal activity remains a concern, a prolonged study can be considered. Priyanka Annabelle Harman   CT ANGIO HEAD W OR WO CONTRAST  Result Date: 02/14/2020 CLINICAL DATA:  Stroke. Right-sided weakness and speech disturbance. EXAM: CT ANGIOGRAPHY HEAD AND NECK CT PERFUSION BRAIN TECHNIQUE: Multidetector CT imaging of the head and neck was performed using the standard protocol during bolus administration of intravenous contrast. Multiplanar CT image reconstructions and MIPs were obtained to evaluate the vascular anatomy. Carotid stenosis measurements (when applicable) are obtained utilizing NASCET criteria, using the distal internal carotid diameter as the denominator. Multiphase CT imaging of the brain was performed following IV bolus contrast injection. Subsequent parametric perfusion maps were calculated using RAPID software. CONTRAST:  40mL OMNIPAQUE IOHEXOL 350 MG/ML SOLN; 75mL OMNIPAQUE IOHEXOL 350 MG/ML SOLN COMPARISON:  None. FINDINGS: CTA NECK FINDINGS Aortic arch: Standard 3 vessel aortic arch with widely  patent arch vessel origins. Right carotid system: Patent and smooth without evidence of stenosis or dissection. Left carotid system: Patent and smooth without evidence of stenosis or dissection. Vertebral arteries: Patent, smooth, and codominant without evidence of stenosis or dissection. Skeleton: Poor dentition with multiple periapical erosions. Other neck: Borderline enlarged level II lymph nodes bilaterally. Upper chest: Centrilobular emphysema. Small superior mediastinal lymph nodes. Review of the MIP images confirms the above findings CTA HEAD FINDINGS Anterior circulation: The internal carotid arteries are widely patent from skull base to carotid termini. The A1 and M1 segments are widely patent with the left A1 segment being mildly hypoplastic. There is a decreased number of branch vessels in the right MCA superior division with suggestion of occlusion and intermittent reconstitution of distal M2 and M3 branches. There is also multifocal irregular narrowing of M3 branches in the right MCA inferior division, and there is mild left MCA branch vessel irregularity. No aneurysm is identified. Posterior circulation: The intracranial vertebral arteries are widely patent to the basilar. The basilar artery is widely patent. There are small posterior communicating arteries bilaterally. PCAs are patent with mild branch vessel irregularity more notable on the right without evidence of flow limiting proximal stenosis. No aneurysm is identified. Venous sinuses: Patent. Anatomic variants: None. Review of the MIP images confirms the above findings CT Brain Perfusion Findings: ASPECTS: 4 CBF (<30%) Volume: 0mL Perfusion (Tmax>6.0s) volume: 12mL Mismatch Volume: 12mL, however the core infarct on noncontrast head CT is not being picked up by the automated processing and the true penumbra is less than 12 mL Infarction Location: Right MCA IMPRESSION: 1. Multiple missing right MCA branch vessels in the superior division with wide  patency of the M1 segment and no discrete proximal M2 occlusion identified. 2. Widely patent common carotid, internal carotid, and vertebral arteries. 3. CT perfusion demonstrating at most a small penumbra as detailed above. These results were called by telephone at the time of interpretation on 02/14/2020 at 7:55 p.m. to Dr. Laurence Slate, who verbally acknowledged these results. Electronically Signed   By: Sebastian Ache M.D.   On: 02/14/2020 20:22   CT HEAD WO CONTRAST  Result Date: 02/19/2020 CLINICAL DATA:  Stroke, follow-up. EXAM: CT HEAD WITHOUT CONTRAST TECHNIQUE: Contiguous axial images were obtained from the base of the skull through the vertex without intravenous contrast. COMPARISON:  Head CT 02/07/2020, brain MRI 02/15/2020 FINDINGS: Brain: Again demonstrated are, now subacute, infarcts within the right basal ganglia, right internal capsule, right frontal operculum, right insula and subinsular region as well as left basal/internal capsule. These infarcts have not significant changed in extent as compared to prior head CT 02/15/2020. No significant mass effect. No midline shift. No evidence of hemorrhagic conversion. No new demarcated infarct is identified. No evidence of intracranial mass. No extra-axial fluid collection. Stable, mild generalized parenchymal atrophy. Vascular: Redemonstrated hyperdense right MCA branch within the right sylvian fissure. Skull: Normal. Negative for fracture or focal lesion. Sinuses/Orbits: Visualized orbits demonstrate no acute abnormality. Mild ethmoid sinus mucosal thickening. No significant mastoid effusion at the imaged levels IMPRESSION: Subacute infarcts within the right MCA territory as well as left basal ganglia/internal capsule, unchanged in extent as compared to head CT 02/15/2020. No significant mass effect. No hemorrhagic conversion. No interval infarct is identified. Stable, mild generalized parenchymal atrophy. Mild ethmoid sinus mucosal thickening. Electronically  Signed   By: Jackey Loge DO   On: 02/19/2020 10:33   CT HEAD WO CONTRAST  Result Date: 02/15/2020 CLINICAL DATA:  Stroke follow-up. COVID-19 infection. History of HIV. EXAM: CT HEAD WITHOUT CONTRAST TECHNIQUE: Contiguous axial images were obtained from the base of the skull through the vertex without intravenous contrast. COMPARISON:  Head MRI 02/15/2020 FINDINGS: Brain: Acute right MCA infarcts involving the basal ganglia, internal capsule, insula, and frontal operculum are unchanged as are small acute left basal ganglia and left internal capsule infarcts. A subcentimeter focus of hemorrhage in the right frontal operculum is unchanged. No new infarct, new intracranial hemorrhage, midline shift, or extra-axial fluid collection is identified. Mild ventricular prominence for age is unchanged and likely reflective of mild cerebral atrophy. Vascular: Hyperdense right MCA branch vessels in the sylvian fissure. Skull: No fracture or suspicious osseous lesion. Sinuses/Orbits: Mild right sphenoid sinus mucosal thickening. Small right maxillary sinus mucous retention cyst. Clear mastoid air cells. Unremarkable orbits. Other: None. IMPRESSION: 1. Unchanged acute right MCA infarcts with unchanged small hemorrhage in the right frontal operculum. 2. Unchanged small acute left basal ganglia and internal capsule infarcts. 3. No new intracranial abnormality. Electronically Signed   By: Sebastian Ache M.D.   On: 02/15/2020 19:40   CT ANGIO NECK W OR WO CONTRAST  Result Date: 02/14/2020 CLINICAL DATA:  Stroke. Right-sided weakness and speech disturbance. EXAM: CT ANGIOGRAPHY HEAD AND NECK CT PERFUSION BRAIN TECHNIQUE: Multidetector CT imaging of the head and neck was performed using the standard protocol during bolus administration of intravenous contrast. Multiplanar CT image reconstructions and MIPs were obtained to evaluate the vascular anatomy. Carotid stenosis measurements (when applicable) are obtained utilizing NASCET  criteria, using the distal internal carotid diameter as the denominator. Multiphase CT imaging of the brain was performed following IV bolus contrast injection. Subsequent parametric perfusion maps were calculated using RAPID software. CONTRAST:  40mL OMNIPAQUE IOHEXOL 350 MG/ML SOLN; 75mL OMNIPAQUE IOHEXOL 350 MG/ML SOLN COMPARISON:  None. FINDINGS: CTA NECK FINDINGS Aortic arch: Standard 3 vessel aortic arch with widely patent arch vessel origins. Right carotid system: Patent and smooth without evidence of stenosis or dissection. Left carotid system: Patent and smooth without evidence of stenosis or dissection. Vertebral arteries: Patent, smooth, and codominant without evidence of stenosis or dissection. Skeleton: Poor dentition with  multiple periapical erosions. Other neck: Borderline enlarged level II lymph nodes bilaterally. Upper chest: Centrilobular emphysema. Small superior mediastinal lymph nodes. Review of the MIP images confirms the above findings CTA HEAD FINDINGS Anterior circulation: The internal carotid arteries are widely patent from skull base to carotid termini. The A1 and M1 segments are widely patent with the left A1 segment being mildly hypoplastic. There is a decreased number of branch vessels in the right MCA superior division with suggestion of occlusion and intermittent reconstitution of distal M2 and M3 branches. There is also multifocal irregular narrowing of M3 branches in the right MCA inferior division, and there is mild left MCA branch vessel irregularity. No aneurysm is identified. Posterior circulation: The intracranial vertebral arteries are widely patent to the basilar. The basilar artery is widely patent. There are small posterior communicating arteries bilaterally. PCAs are patent with mild branch vessel irregularity more notable on the right without evidence of flow limiting proximal stenosis. No aneurysm is identified. Venous sinuses: Patent. Anatomic variants: None. Review of  the MIP images confirms the above findings CT Brain Perfusion Findings: ASPECTS: 4 CBF (<30%) Volume: 57mL Perfusion (Tmax>6.0s) volume: 48mL Mismatch Volume: 78mL, however the core infarct on noncontrast head CT is not being picked up by the automated processing and the true penumbra is less than 12 mL Infarction Location: Right MCA IMPRESSION: 1. Multiple missing right MCA branch vessels in the superior division with wide patency of the M1 segment and no discrete proximal M2 occlusion identified. 2. Widely patent common carotid, internal carotid, and vertebral arteries. 3. CT perfusion demonstrating at most a small penumbra as detailed above. These results were called by telephone at the time of interpretation on 02/14/2020 at 7:55 p.m. to Dr. Laurence Slate, who verbally acknowledged these results. Electronically Signed   By: Sebastian Ache M.D.   On: 02/14/2020 20:22   MR BRAIN WO CONTRAST  Result Date: 02/15/2020 CLINICAL DATA:  Stroke.  HIV. EXAM: MRI HEAD WITHOUT CONTRAST TECHNIQUE: Multiplanar, multiecho pulse sequences of the brain and surrounding structures were obtained without intravenous contrast. COMPARISON:  CT head 02/14/2020 FINDINGS: Brain: Acute right MCA infarct involving the right lenticular nucleus as well as the right frontal operculum and right parietal operculum. Small area of acute infarction in the genu internal capsule on the right. Small area of associated hemorrhage in the right frontal operculum and right parietal operculum. Small areas of acute infarct in the left basal ganglia involving the genu internal capsule and posterior external capsule fibers. Ventricle size normal.  No midline shift.  Negative for mass lesion. Vascular: Normal arterial flow voids. Skull and upper cervical spine: No focal skeletal lesion. Sinuses/Orbits: Mild mucosal edema paranasal sinuses. Negative orbit Other: None IMPRESSION: Acute infarct right MCA territory involving the basal ganglia as well as the right frontal  and parietal operculum where there are small areas of associated hemorrhage. Small area of acute infarct in the left internal capsule and external capsule. Electronically Signed   By: Marlan Palau M.D.   On: 02/15/2020 07:27   CT CEREBRAL PERFUSION W CONTRAST  Result Date: 02/14/2020 CLINICAL DATA:  Stroke. Right-sided weakness and speech disturbance. EXAM: CT ANGIOGRAPHY HEAD AND NECK CT PERFUSION BRAIN TECHNIQUE: Multidetector CT imaging of the head and neck was performed using the standard protocol during bolus administration of intravenous contrast. Multiplanar CT image reconstructions and MIPs were obtained to evaluate the vascular anatomy. Carotid stenosis measurements (when applicable) are obtained utilizing NASCET criteria, using the distal internal carotid diameter as the  denominator. Multiphase CT imaging of the brain was performed following IV bolus contrast injection. Subsequent parametric perfusion maps were calculated using RAPID software. CONTRAST:  40mL OMNIPAQUE IOHEXOL 350 MG/ML SOLN; 75mL OMNIPAQUE IOHEXOL 350 MG/ML SOLN COMPARISON:  None. FINDINGS: CTA NECK FINDINGS Aortic arch: Standard 3 vessel aortic arch with widely patent arch vessel origins. Right carotid system: Patent and smooth without evidence of stenosis or dissection. Left carotid system: Patent and smooth without evidence of stenosis or dissection. Vertebral arteries: Patent, smooth, and codominant without evidence of stenosis or dissection. Skeleton: Poor dentition with multiple periapical erosions. Other neck: Borderline enlarged level II lymph nodes bilaterally. Upper chest: Centrilobular emphysema. Small superior mediastinal lymph nodes. Review of the MIP images confirms the above findings CTA HEAD FINDINGS Anterior circulation: The internal carotid arteries are widely patent from skull base to carotid termini. The A1 and M1 segments are widely patent with the left A1 segment being mildly hypoplastic. There is a decreased  number of branch vessels in the right MCA superior division with suggestion of occlusion and intermittent reconstitution of distal M2 and M3 branches. There is also multifocal irregular narrowing of M3 branches in the right MCA inferior division, and there is mild left MCA branch vessel irregularity. No aneurysm is identified. Posterior circulation: The intracranial vertebral arteries are widely patent to the basilar. The basilar artery is widely patent. There are small posterior communicating arteries bilaterally. PCAs are patent with mild branch vessel irregularity more notable on the right without evidence of flow limiting proximal stenosis. No aneurysm is identified. Venous sinuses: Patent. Anatomic variants: None. Review of the MIP images confirms the above findings CT Brain Perfusion Findings: ASPECTS: 4 CBF (<30%) Volume: 0mL Perfusion (Tmax>6.0s) volume: 12mL Mismatch Volume: 12mL, however the core infarct on noncontrast head CT is not being picked up by the automated processing and the true penumbra is less than 12 mL Infarction Location: Right MCA IMPRESSION: 1. Multiple missing right MCA branch vessels in the superior division with wide patency of the M1 segment and no discrete proximal M2 occlusion identified. 2. Widely patent common carotid, internal carotid, and vertebral arteries. 3. CT perfusion demonstrating at most a small penumbra as detailed above. These results were called by telephone at the time of interpretation on 02/14/2020 at 7:55 p.m. to Dr. Laurence Slate, who verbally acknowledged these results. Electronically Signed   By: Sebastian Ache M.D.   On: 02/14/2020 20:22   DG Chest Port 1 View  Result Date: 02/14/2020 CLINICAL DATA:  Acute CVA. Evaluate for pneumonia. EXAM: PORTABLE CHEST 1 VIEW COMPARISON:  Radiograph 07/07/2019. Lung apices from CT angiography earlier today. FINDINGS: Ill-defined opacity in the right mid lung abutting the minor fissure. Mild cardiomegaly. Normal mediastinal  contours. No pulmonary edema, large pleural effusion or pneumothorax. No acute osseous abnormalities are seen. IMPRESSION: Ill-defined opacity in the right mid lung abutting the minor fissure, suspicious for pneumonia. Mild cardiomegaly. Electronically Signed   By: Narda Rutherford M.D.   On: 02/14/2020 21:58   DG Chest Port 1V same Day  Result Date: 02/19/2020 CLINICAL DATA:  COVID-19 positive, shortness of breath EXAM: PORTABLE CHEST 1 VIEW COMPARISON:  02/14/2020 FINDINGS: Interval placement of enteric tube coursing below the diaphragm, distal tip beyond the inferior margin of the film. Cardiomediastinal contours are stable. Subtle opacity within the right mid lung is again seen abutting the minor fissure, slightly improved from prior. No new focal airspace consolidation. No pleural effusion or pneumothorax. IMPRESSION: 1. Interval placement of enteric tube, distal tip  beyond the inferior margin of the film. 2. Slight interval improvement in right mid lung opacity. Electronically Signed   By: Duanne GuessNicholas  Plundo D.O.   On: 02/19/2020 08:54   DG Abd Portable 1V  Result Date: 02/15/2020 CLINICAL DATA:  MRI clearance EXAM: PORTABLE ABDOMEN - 1 VIEW COMPARISON:  None. FINDINGS: The bowel gas pattern is normal. No radio-opaque calculi or other significant radiographic abnormality are seen. IMPRESSION: No radial pain metallic foreign body in the abdomen or pelvis. Electronically Signed   By: Elige KoHetal  Patel   On: 02/15/2020 05:57   VAS US TRANSCRANIAL DOPPLER W BUBBLES  Result Date: 02/17/2020  Transcranial Doppler with Bubble Indications: PFO. Comparison Study: No prior studies. Performing Technologist: Chanda BusingGregory Collins RVT  Examination Guidelines: A complete evaluation includes B-mode imaging, spectral Doppler, color Doppler, and power Doppler as needed of all accessible portions of each vessel. Bilateral testing is considered an integral part of a complete examination. Limited examinations for reoccurring  indications may be performed as noted.  Summary:  A vascular evaluation was performed. The right P2 was studied. An IV was inserted into the patient's right Brachial. Verbal informed consent was obtained. No HITS. Negative for PFO.  Negative TCD Bubble study .No indication of right to left shunt noted. *See table(s) above for TCD measurements and observations.  Diagnosing physician: Delia HeadyPramod Sethi MD Electronically signed by Delia HeadyPramod Sethi MD on 02/17/2020 at 12:36:13 PM.    Final    CT HEAD CODE STROKE WO CONTRAST  Result Date: 02/14/2020 CLINICAL DATA:  Code stroke.  Right-sided weakness. EXAM: CT HEAD WITHOUT CONTRAST TECHNIQUE: Contiguous axial images were obtained from the base of the skull through the vertex without intravenous contrast. COMPARISON:  03/07/2014 FINDINGS: Brain: There is an acute right basal ganglia infarct involving the caudate nucleus, lentiform nucleus, and a portion of the anterior limb of the right internal capsule. There is a subcentimeter focus of acute hemorrhage superficially in the right frontal operculum with a small amount of surrounding low-density, and there is also loss of gray-white differentiation indicative of an acute infarct in the anterior aspect of the right insula and more anterior aspect of the right frontal lobe. There is a new age indeterminate lacunar infarct in the genu of the left internal capsule. The ventricles are normal in size. There is no midline shift or extra-axial fluid collection. Vascular: Hyperdense right MCA. Skull: No fracture or suspicious osseous lesion. Sinuses/Orbits: Partially visualized small mucous retention cyst in the right maxillary sinus. Clear mastoid air cells. Rightward gaze. Other: None. ASPECTS [facility]([place] Stroke Program Early CT Score) - Ganglionic level infarction (caudate, lentiform nuclei, internal capsule, insula, M1-M3 cortex): 1 - Supraganglionic infarction (M4-M6 cortex): 3 Total score (0-10 with 10 being normal): 4 IMPRESSION: 1.  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 2. ASPECTS is 4. 3. 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 These results were communicated to Dr. Laurence SlateAroor at 7:29 pmon 02/14/2020 by text page via the [facility] messaging system. Electronically Signed   By: Sebastian AcheAllen  Grady M.D.   On: 02/14/2020 19:30   VAS US UPPER EXTREMITY VENOUS DUPLEX  Result Date: 02/18/2020 UPPER VENOUS STUDY  Indications: Swelling Limitations: Patient positioning. Comparison Study: no prior Performing Technologist: Blanch MediaMegan Riddle RVS  Examination Guidelines: A complete evaluation includes B-mode imaging, spectral Doppler, color Doppler, and power Doppler as needed of all accessible portions of each vessel. Bilateral testing is considered  an integral part of a complete examination. Limited examinations for reoccurring indications may be performed as noted.  Right Findings: +----------+------------+---------+-----------+----------+--------------+ RIGHT     CompressiblePhasicitySpontaneousProperties   Summary     +----------+------------+---------+-----------+----------+--------------+ IJV                                                 Not visualized +----------+------------+---------+-----------+----------+--------------+ Subclavian    Full       Yes       Yes                             +----------+------------+---------+-----------+----------+--------------+ Axillary      Full       Yes       Yes                             +----------+------------+---------+-----------+----------+--------------+ Brachial      Full       Yes       Yes                             +----------+------------+---------+-----------+----------+--------------+ Radial        Full                                                 +----------+------------+---------+-----------+----------+--------------+ Ulnar          Full                                                 +----------+------------+---------+-----------+----------+--------------+ Cephalic      Full                                                 +----------+------------+---------+-----------+----------+--------------+ Basilic       Full                                                 +----------+------------+---------+-----------+----------+--------------+  Summary:  Right: No evidence of deep vein thrombosis in the upper extremity. No evidence of superficial vein thrombosis in the upper extremity.  *See table(s) above for measurements and observations.  Diagnosing physician: Gretta Began MD Electronically signed by Gretta Began MD on 02/18/2020 at 6:41:36 PM.    Final    ECHOCARDIOGRAM LIMITED  Result Date: 02/15/2020    ECHOCARDIOGRAM LIMITED REPORT   Patient Name:   HAYLEN SHELNUTT Date of Exam: 02/15/2020 Medical Rec #:  [id number]      Height:       68.0 in Accession #:    [id number]     Weight:       152.1 lb Date of Birth:  Dec 22, 1985       BSA:  1.819 m Patient Age:    33 years       BP:           139/86 mmHg Patient Gender: M              HR:           78 bpm. Exam Location:  Inpatient Procedure: Limited Echo, Limited Color Doppler and Cardiac Doppler Indications:    Stroke 434.91 / I163.9                 Endocarditis I38  History:        Patient has no prior history of Echocardiogram examinations.                 HIV.  Sonographer:    Darlina Sicilian RDCS Referring Phys: [id number] [place]  1. Left ventricular ejection fraction, by estimation, is 55 to 60%. The left ventricle has normal function. The left ventricle has no regional wall motion abnormalities. Left ventricular diastolic parameters were normal.  2. The mitral valve is grossly normal. Trivial mitral valve regurgitation. No evidence of mitral stenosis.  3. The aortic valve is tricuspid. Aortic valve regurgitation is not visualized. No aortic stenosis is  present.  4. There is normal pulmonary artery systolic pressure. The estimated right ventricular systolic pressure is 33.8 mmHg.  5. The inferior vena cava is normal in size with greater than 50% respiratory variability, suggesting right atrial pressure of 3 mmHg. Conclusion(s)/Recommendation(s): No evidence of valvular vegetations on this transthoracic echocardiogram. Would recommend a transesophageal echocardiogram to exclude infective endocarditis if clinically indicated. FINDINGS  Left Ventricle: Left ventricular ejection fraction, by estimation, is 55 to 60%. The left ventricle has normal function. The left ventricle has no regional wall motion abnormalities. The left ventricular internal cavity size was normal in size. There is  no left ventricular hypertrophy. Right Ventricle: There is normal pulmonary artery systolic pressure. The tricuspid regurgitant velocity is 2.29 m/s, and with an assumed right atrial pressure of 3 mmHg, the estimated right ventricular systolic pressure is 25.0 mmHg. Left Atrium: Left atrial size was normal in size. Right Atrium: Right atrial size was normal in size. Pericardium: A small pericardial effusion is present. The pericardial effusion is circumferential. Mitral Valve: The mitral valve is grossly normal. Trivial mitral valve regurgitation. No evidence of mitral valve stenosis. There is no evidence of mitral valve vegetation. Tricuspid Valve: The tricuspid valve is grossly normal. Tricuspid valve regurgitation is trivial. No evidence of tricuspid stenosis. There is no evidence of tricuspid valve vegetation. Aortic Valve: The aortic valve is tricuspid. Aortic valve regurgitation is not visualized. No aortic stenosis is present. There is no evidence of aortic valve vegetation. Pulmonic Valve: The pulmonic valve was grossly normal. Pulmonic valve regurgitation is trivial. No evidence of pulmonic stenosis. There is no evidence of pulmonic valve vegetation. Venous: The inferior vena  cava is normal in size with greater than 50% respiratory variability, suggesting right atrial pressure of 3 mmHg. IAS/Shunts: The atrial septum is grossly normal.  LEFT VENTRICLE PLAX 2D LVIDd:         5.00 cm      Diastology LVIDs:         3.50 cm      LV e' lateral:   12.70 cm/s LV PW:         1.00 cm      LV E/e' lateral: 5.0 LV IVS:        1.00 cm  LV e' medial:    12.50 cm/s                             LV E/e' medial:  5.1  LV Volumes (MOD) LV vol d, MOD A2C: 151.0 ml LV vol d, MOD A4C: 177.0 ml LV vol s, MOD A2C: 73.1 ml LV vol s, MOD A4C: 68.9 ml LV SV MOD A2C:     77.9 ml LV SV MOD A4C:     177.0 ml LV SV MOD BP:      94.8 ml LEFT ATRIUM             Index       RIGHT ATRIUM           Index LA diam:        3.30 cm 1.81 cm/m  RA Area:     14.30 cm LA Vol (A2C):   54.3 ml 29.85 ml/m RA Volume:   36.30 ml  19.95 ml/m LA Vol (A4C):   44.8 ml 24.62 ml/m LA Biplane Vol: 53.9 ml 29.63 ml/m  AORTIC VALVE LVOT Vmax:   104.00 cm/s LVOT Vmean:  72.900 cm/s LVOT VTI:    0.168 m  AORTA Ao Root diam: 3.50 cm MITRAL VALVE               TRICUSPID VALVE MV Area (PHT): 2.66 cm    TR Peak grad:   21.0 mmHg MV Decel Time: 285 msec    TR Vmax:        229.00 cm/s MV E velocity: 63.40 cm/s MV A velocity: 75.80 cm/s  SHUNTS MV E/A ratio:  0.84        Systemic VTI: 0.17 m Lennie Odor MD Electronically signed by Lennie Odor MD Signature Date/Time: 02/15/2020/1:27:05 PM    Final

## 2020-02-19 NOTE — Progress Notes (Signed)
EEG complete - results pending 

## 2020-02-19 NOTE — Procedures (Signed)
Patient Name: Tyler Vargas  MRN: [id number]  Epilepsy Attending: Charlsie Quest  Referring Physician/Provider: Dr Jeoffrey Massed Date: 02/19/2020 Duration: 23.23 mins  Patient history: 33yo M with acute R MCA stroke as well as L basal ganglia/internal capsue stroke who continues to be encephalopathic. EEG to evaluate for seizure.   Level of alertness: awake  AEDs during EEG study: None  Technical aspects: This EEG study was done with scalp electrodes positioned according to the 10-20 International system of electrode placement. Electrical activity was acquired at a sampling rate of 500Hz  and reviewed with a high frequency filter of 70Hz  and a low frequency filter of 1Hz . EEG data were recorded continuously and digitally stored.   DESCRIPTION: The posterior dominant rhythm consists of 8 Hz activity of moderate voltage (25-35 uV) seen predominantly in posterior head regions, symmetric and reactive to eye opening and eye closing. EEG also showed intermittent generalized and maximal right frontotemporal region 3-6hz  theta-delta slowing. Hyperventilation and photic stimulation were not performed.  ABNORMALITY - Intermittent slow, generalized and maximal right frontotemporal region  IMPRESSION: This study is suggestive of cortical dysfunction in right frontotemporal region likely secondary to underlying stroke as well as mild diffuse encephalopathy, non specific to etiology. No seizures or epileptiform discharges were seen throughout the recording.  Jesyca Weisenburger 

## 2020-02-19 NOTE — Progress Notes (Signed)
Patient ID: Tyler Vargas, adult   DOB: [birth year], 34 y.o.   MRN: [id number]         [facility]  Date of Admission:  02/14/2020     ASSESSMENT: She has multiple potential causes of fever including advanced and very active HIV infection, Covid infection, her recent CVA, sulfa hypersensitivity reaction and hospital-acquired infections.  Chest x-ray does not show any new changes suggesting pneumonia.  Repeat blood cultures are pending.  I will check for eosinophilia suggesting allergic reaction to trimethoprim sulfamethoxazole.  I have changed to Biktarvy to Triumeq which can be crushed and put down her NG tube.  I favor observation off of antibiotics for now.  PLAN: 1. Observe off of antibiotics 2. Check white blood cell differential 3. Await results of blood cultures 4. Change Biktarvy to [employer]  Principal Problem:   Fever Active Problems:   AIDS (acquired immune deficiency syndrome) (HCC)   Acute CVA (cerebrovascular accident) (HCC)   COVID-19   History of syphilis   Cocaine use disorder, mild, abuse (HCC)   Scheduled Meds: .  stroke: mapping our early stages of recovery book   Does not apply Once  . aspirin  325 mg Per NG tube Daily  . bictegravir-emtricitabine-tenofovir AF  1 tablet Oral Daily  . dexamethasone (DECADRON) injection  6 mg Intravenous Q24H  . enoxaparin (LOVENOX) injection  40 mg Subcutaneous Q24H  . feeding supplement (PRO-STAT SUGAR FREE 64)  30 mL Per Tube BID  . influenza vac split quadrivalent PF  0.5 mL Intramuscular Tomorrow-1000  . pneumococcal 23 valent vaccine  0.5 mL Intramuscular Tomorrow-1000  . sulfamethoxazole-trimethoprim  20 mL Per Tube Daily   Continuous Infusions: . dextrose 5% lactated ringers 10 mL/hr at 02/19/20 2035  . feeding supplement (OSMOLITE 1.5 CAL) 55 mL/hr at 02/19/20 0531   PRN Meds:.acetaminophen **OR** acetaminophen (TYLENOL) oral liquid 160 mg/5 mL **OR** acetaminophen,  senna-docusate   SUBJECTIVE: Tyler Vargas developed recurrent fevers overnight and I was asked to come back for reevaluation.  Review of Systems: Review of Systems  Unable to perform ROS: Mental acuity    No Known Allergies  OBJECTIVE: Vitals:   02/19/20 0634 02/19/20 0729 02/19/20 1100 02/19/20 1235  BP:  120/72  112/78  Pulse:  88  82  Resp: (!) 21 (!) 24  18  Temp: 100.1 F (37.8 C) (!) 101.9 F (38.8 C) 99.5 F (37.5 C) 98.1 F (36.7 C)  TempSrc:  Axillary Axillary Axillary  SpO2:  96%  95%  Weight:      Height:       Body mass index is 21.55 kg/m.  Physical Exam Constitutional:      Comments: She remains mute/aphasic.  Her eyes are open but she does not obviously track.  Cardiovascular:     Rate and Rhythm: Normal rate and regular rhythm.     Heart sounds: No murmur.  Pulmonary:     Effort: Pulmonary effort is normal.     Comments: Coarse upper airway noise. Abdominal:     Palpations: Abdomen is soft. There is no mass.     Tenderness: There is no abdominal tenderness.     Comments: No diarrhea reported.  Musculoskeletal:        General: No swelling or tenderness.  Skin:    Findings: No rash.  Neurological:     Motor: Weakness present.     Comments: No change in right-sided weakness.     Lab Results Lab Results  Component Value  Date   WBC 8.2 02/19/2020   HGB 11.1 (L) 02/19/2020   HCT 34.6 (L) 02/19/2020   MCV 90.8 02/19/2020   PLT 690 (H) 02/19/2020    Lab Results  Component Value Date   CREATININE 0.82 02/19/2020   BUN 13 02/19/2020   NA 136 02/19/2020   K 3.6 02/19/2020   CL 101 02/19/2020   CO2 23 02/19/2020    Lab Results  Component Value Date   ALT 29 02/19/2020   AST 60 (H) 02/19/2020   ALKPHOS 57 02/19/2020   BILITOT 0.3 02/19/2020     Microbiology: Recent Results (from the past 240 hour(s))  Culture, blood (Routine X 2) w Reflex to ID Panel     Status: None   Collection Time: 02/14/20  9:40 PM   Specimen: BLOOD  Result  Value Ref Range Status   Specimen Description BLOOD RIGHT ARM  Final   Special Requests   Final    BOTTLES DRAWN AEROBIC AND ANAEROBIC Blood Culture adequate volume   Culture   Final    NO GROWTH 5 DAYS Performed at [facility], [address], [place], [place] [address]    Report Status 02/19/2020 FINAL  Final  Culture, blood (Routine X 2) w Reflex to ID Panel     Status: None   Collection Time: 02/14/20  9:46 PM   Specimen: BLOOD  Result Value Ref Range Status   Specimen Description BLOOD RIGHT FOREARM  Final   Special Requests   Final    BOTTLES DRAWN AEROBIC AND ANAEROBIC Blood Culture adequate volume   Culture   Final    NO GROWTH 5 DAYS Performed at [facility], [address] [place], [place] [address]    Report Status 02/19/2020 FINAL  Final  Urine culture     Status: Abnormal   Collection Time: 02/14/20  9:46 PM   Specimen: In/Out Cath Urine  Result Value Ref Range Status   Specimen Description IN/OUT CATH URINE  Final   Special Requests   Final    NONE Performed at [facility], [address] [place], [place] [address]    Culture MULTIPLE SPECIES PRESENT, SUGGEST RECOLLECTION (A)  Final   Report Status 02/16/2020 FINAL  Final  SARS CORONAVIRUS 2 (TAT 6-24 HRS)     Status: Abnormal   Collection Time: 02/14/20 10:55 PM  Result Value Ref Range Status   SARS Coronavirus 2 POSITIVE (A) NEGATIVE Final    Comment: RESULT CALLED TO, READ BACK BY AND VERIFIED WITH: K. GUY,RN [id number] 02/15/2020 T. TYSOR (NOTE) SARS-CoV-2 target nucleic acids are DETECTED. The SARS-CoV-2 RNA is generally detectable in upper and lower respiratory specimens during the acute phase of infection. Positive results are indicative of the presence of SARS-CoV-2 RNA. Clinical correlation with patient history and other diagnostic information is  necessary to determine patient infection status. Positive results do not rule out bacterial infection or co-infection with other viruses.   The expected result is Negative. Fact Sheet for Patients: HairSlick.no Fact Sheet for Healthcare Providers: quierodirigir.com This test is not yet approved or cleared by the [place] FDA and  has been authorized for detection and/or diagnosis of SARS-CoV-2 by FDA under an Emergency Use Authorization (EUA). This EUA will remain  in effect (meaning this test can be used) for the  duration of the COVID-19 declaration under Section 564(b)(1) of the Act, 21 U.S.C. section 360bbb-3(b)(1), unless the authorization is terminated or revoked sooner. Performed at [facility], [place] [address] [place], [place] [address]   CSF culture with Stat gram stain     Status: None   Collection Time: 02/14/20 11:35 PM   Specimen: CSF; Cerebrospinal Fluid  Result Value Ref Range Status   Specimen Description CSF  Final   Special Requests NONE  Final   Gram Stain   Final    CYTOSPIN SMEAR WBC PRESENT,BOTH PMN AND MONONUCLEAR NO ORGANISMS SEEN    Culture   Final    NO GROWTH 3 DAYS Performed at [facility], [place] [address], [place], [place] [address]    Report Status 02/18/2020 FINAL  Final    Michel Bickers, MD [facility] [place] [phone] pager   [phone] cell 02/19/2020, 1:41 PM

## 2020-02-20 DIAGNOSIS — Z21 Asymptomatic human immunodeficiency virus [HIV] infection status: Secondary | ICD-10-CM

## 2020-02-20 LAB — FERRITIN: Ferritin: 745 ng/mL — ABNORMAL HIGH (ref 24–336)

## 2020-02-20 LAB — COMPREHENSIVE METABOLIC PANEL
ALT: 32 U/L (ref 0–44)
AST: 81 U/L — ABNORMAL HIGH (ref 15–41)
Albumin: 2.1 g/dL — ABNORMAL LOW (ref 3.5–5.0)
Alkaline Phosphatase: 72 U/L (ref 38–126)
Anion gap: 9 (ref 5–15)
BUN: 16 mg/dL (ref 6–20)
CO2: 26 mmol/L (ref 22–32)
Calcium: 8.5 mg/dL — ABNORMAL LOW (ref 8.9–10.3)
Chloride: 100 mmol/L (ref 98–111)
Creatinine, Ser: 0.98 mg/dL (ref 0.61–1.24)
GFR calc Af Amer: >60 mL/min (ref >60)
GFR calc non Af Amer: >60 mL/min (ref >60)
Glucose, Bld: 106 mg/dL — ABNORMAL HIGH (ref 70–99)
Potassium: 3.9 mmol/L (ref 3.5–5.1)
Sodium: 135 mmol/L (ref 135–145)
Total Bilirubin: 0.5 mg/dL (ref 0.3–1.2)
Total Protein: 8 g/dL (ref 6.5–8.1)

## 2020-02-20 LAB — BLOOD CULTURE ID PANEL (REFLEXED)

## 2020-02-20 LAB — CBC
HCT: 35.8 % — ABNORMAL LOW (ref 39.0–52.0)
Hemoglobin: 11.3 g/dL — ABNORMAL LOW (ref 13.0–17.0)
MCH: 29.3 pg (ref 26.0–34.0)
MCHC: 31.6 g/dL (ref 30.0–36.0)
MCV: 92.7 fL (ref 80.0–100.0)
Platelets: 684 10*3/uL — ABNORMAL HIGH (ref 150–400)
RBC: 3.86 MIL/uL — ABNORMAL LOW (ref 4.22–5.81)
RDW: 15.7 % — ABNORMAL HIGH (ref 11.5–15.5)
WBC: 9 10*3/uL (ref 4.0–10.5)
nRBC: 0 % (ref 0.0–0.2)

## 2020-02-20 LAB — D-DIMER, QUANTITATIVE: D-Dimer, Quant: 2.55 ug/mL-FEU — ABNORMAL HIGH (ref 0.00–0.50)

## 2020-02-20 LAB — GLUCOSE, CAPILLARY
Glucose-Capillary: 107 mg/dL — ABNORMAL HIGH (ref 70–99)
Glucose-Capillary: 108 mg/dL — ABNORMAL HIGH (ref 70–99)
Glucose-Capillary: 110 mg/dL — ABNORMAL HIGH (ref 70–99)
Glucose-Capillary: 117 mg/dL — ABNORMAL HIGH (ref 70–99)
Glucose-Capillary: 120 mg/dL — ABNORMAL HIGH (ref 70–99)
Glucose-Capillary: 135 mg/dL — ABNORMAL HIGH (ref 70–99)

## 2020-02-20 LAB — C-REACTIVE PROTEIN: CRP: 4.5 mg/dL — ABNORMAL HIGH (ref <1.0)

## 2020-02-20 MED ORDER — VANCOMYCIN HCL IN DEXTROSE 1-5 GM/200ML-% IV SOLN
1000.0000 mg | Freq: Two times a day (BID) | INTRAVENOUS | Status: DC
  Administered 2020-02-21 – 2020-02-24 (×7): 1000 mg via INTRAVENOUS
  Filled 2020-02-20 (×8): qty 200

## 2020-02-20 MED ORDER — VANCOMYCIN HCL 1250 MG/250ML IV SOLN
1250.0000 mg | Freq: Once | INTRAVENOUS | Status: AC
  Administered 2020-02-20: 1250 mg via INTRAVENOUS
  Filled 2020-02-20: qty 250

## 2020-02-20 NOTE — Plan of Care (Signed)
  Problem: Education: Goal: Knowledge of General Education information will improve Description: Including pain rating scale, medication(s)/side effects and non-pharmacologic comfort measures Outcome: Progressing   Problem: Health Behavior/Discharge Planning: Goal: Ability to manage health-related needs will improve Outcome: Progressing   Problem: Clinical Measurements: Goal: Ability to maintain clinical measurements within normal limits will improve Outcome: Progressing Goal: Will remain free from infection Outcome: Progressing Goal: Diagnostic test results will improve Outcome: Progressing Goal: Respiratory complications will improve Outcome: Progressing Goal: Cardiovascular complication will be avoided Outcome: Progressing   Problem: Activity: Goal: Risk for activity intolerance will decrease Outcome: Progressing   Problem: Nutrition: Goal: Adequate nutrition will be maintained Outcome: Progressing   Problem: Coping: Goal: Level of anxiety will decrease Outcome: Progressing   Problem: Elimination: Goal: Will not experience complications related to bowel motility Outcome: Progressing Goal: Will not experience complications related to urinary retention Outcome: Progressing   Problem: Pain Managment: Goal: General experience of comfort will improve Outcome: Progressing   Problem: Safety: Goal: Ability to remain free from injury will improve Outcome: Progressing   Problem: Skin Integrity: Goal: Risk for impaired skin integrity will decrease Outcome: Progressing   Problem: Education: Goal: Knowledge of disease or condition will improve Outcome: Progressing Goal: Knowledge of secondary prevention will improve Outcome: Progressing Goal: Individualized Educational Video(s) Outcome: Progressing   Problem: Coping: Goal: Will verbalize positive feelings about self Outcome: Progressing Goal: Will identify appropriate support needs Outcome: Progressing    Problem: Health Behavior/Discharge Planning: Goal: Ability to manage health-related needs will improve Outcome: Progressing

## 2020-02-20 NOTE — Plan of Care (Signed)
  Problem: Education: Goal: Knowledge of General Education information will improve Description: Including pain rating scale, medication(s)/side effects and non-pharmacologic comfort measures Outcome: Progressing   Problem: Health Behavior/Discharge Planning: Goal: Ability to manage health-related needs will improve Outcome: Progressing   Problem: Clinical Measurements: Goal: Ability to maintain clinical measurements within normal limits will improve Outcome: Progressing Goal: Will remain free from infection Outcome: Progressing Goal: Diagnostic test results will improve Outcome: Progressing Goal: Respiratory complications will improve Outcome: Progressing Goal: Cardiovascular complication will be avoided Outcome: Progressing   Problem: Activity: Goal: Risk for activity intolerance will decrease Outcome: Progressing   Problem: Nutrition: Goal: Adequate nutrition will be maintained Outcome: Progressing   Problem: Coping: Goal: Level of anxiety will decrease Outcome: Progressing   Problem: Elimination: Goal: Will not experience complications related to bowel motility Outcome: Progressing Goal: Will not experience complications related to urinary retention Outcome: Progressing   Problem: Pain Managment: Goal: General experience of comfort will improve Outcome: Progressing   Problem: Safety: Goal: Ability to remain free from injury will improve Outcome: Progressing   Problem: Skin Integrity: Goal: Risk for impaired skin integrity will decrease Outcome: Progressing   Problem: Education: Goal: Knowledge of disease or condition will improve Outcome: Progressing Goal: Knowledge of secondary prevention will improve Outcome: Progressing Goal: Individualized Educational Video(s) Outcome: Progressing   Problem: Coping: Goal: Will verbalize positive feelings about self Outcome: Progressing Goal: Will identify appropriate support needs Outcome: Progressing    Problem: Health Behavior/Discharge Planning: Goal: Ability to manage health-related needs will improve Outcome: Progressing   

## 2020-02-20 NOTE — Progress Notes (Signed)
PROGRESS NOTE                                                                                                                                                                                                             Patient Demographics:    Tyler Vargas, is a 34 y.o. adult, DOB - [birth year], ZOX:[id number]  Outpatient Primary MD for the patient is Patient, No Pcp Per   Admit date - 02/14/2020   LOS - 6  Chief Complaint  Patient presents with  . Code Stroke       Brief Narrative: Patient is a 34 y.o. 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  See below for further details.  Significant Events: 3/27>> admit to [facility] for acute CVA. 3/31>>cortak tube inserted  COVID-19 medications: Steroids: 3/28>>4/2 Remdesivir: 3/28>>4/1  Antibiotics: Penicillin G: 3/27>> 3/28  Microbiology data: 4/1>>blood culture:pending 3/27>> Blood culture: negative 3/27>> CSF culture: Negative 3/27>> urine culture: Multiple morphotypes 3/27>> VDRL CSF: Nonreactive 3/27>> CSF cryptococcal antigen negative  DVT prophylaxis: SQ Lovenox  Procedures: 3/27 >>lumbar puncture: By ED MD  Consults: ID Neuro    Subjective:   Remains encephalopathic-opens eyes when I call out his name.  Occasionally tracks my movement but really does not follow commands.  Low-grade fever overnight.   Assessment  & Plan :   Acute CVA: Remains aphasic-exam is difficult because of encephalopathy.  He has obvious left-sided weakness-but is not moving his right side as well-but picture is clouded by encephalopathy.  Repeat CT head on 4/1 without any new findings.  Spot EEG on 4/1 without obvious seizure-like activity.  Extensive stroke work-up/neurology evaluation completed-CTA neck without any significant extracranial stenosis, echo without any obvious  embolic source, transcranial Doppler negative for PFO.  Given ongoing encephalopathy-somewhat of a different exam today compared to yesterday-I have ordered a CT head and EEG.  This MD spoke with Dr. Pearlean Brownie on 4/1-apparently this patient has had waxing and waning encephalopathy in the past as well.    Dysphagia: Secondary to above-Cortak tube inserted on 3/31-started NG tube feedings.  If dysphagia persists-likely will need PEG tube at some point.  Covid 19 Viral pneumonia: Remains stable on room air-CRP has jumped up slightly-but patient has numerous other reasons for elevated inflammatory markers.  Has completed remdesivir on 4/1-we will go ahead and discontinue steroids on 4/2.    Fever: afebrile  O2 requirements:  SpO2: 96 %   COVID-19 Labs: Recent Labs    02/18/20 0336 02/19/20 0450 02/20/20 0541  DDIMER 2.25* 2.94* 2.55*  FERRITIN 695* 713* 745*  CRP 2.2* 2.0* 4.5*    No results found for: BNP  No results for input(s): PROCALCITON in the last 168 hours.  Lab Results  Component Value Date   SARSCOV2NAA POSITIVE (A) 02/14/2020    ?  Aseptic meningitis: CSF with elevated protein levels-and slight leukocytosis-mostly lymphocytes.  CSF cultures negative-VDRL CSF negative-CSF cryptococcal antigen negative.  Now with fever-more encephalopathic-repeating CT head and checking spot EEG.  Fever: Continues to have fever-although low-grade overnight.  No obvious sources of infection apparent-but given advanced HIV-ongoing Covid pneumonitis-multiple foci of infection possible.  Repeat blood cultures on 4/1 pending.  Appreciate ID input-we will await further recommendations.    Acute metabolic encephalopathy: Remains encephalopathic-not following commands-no febrile.  Repeat CT head on 4/1 without any acute findings.  EEG on 4/1 without seizure-like activity.  Appears to have encephalitis/aseptic meningitis-likely related to HIV.  Note CSF VDRL and CSF cryptococcal antigen negative.  Continue  supportive care.    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  History of syphilis: CSF VDRL negative-not likely to have neurosyphilis as the cause of CVA.  No longer on penicillin G.   Cocaine use: We will counsel when more awake and alert  Right upper extremity swelling: Likely secondary to infiltrated IV-Doppler ultrasound on 3/31 neg for DVT.  Goals of care: Remains a full code-I had a long discussion with the patient's father this morning-he is aware of the tenuous clinical situation-and possibly poor overall long-term prognosis.  I will reach out to the family again tomorrow-but suspect at some point may need to involve palliative care.  Nutrition Problem: Nutrition Problem: Increased nutrient needs Etiology: chronic illness(HIV/AIDS) Signs/Symptoms: estimated needs Interventions: Tube feeding, Prostat  ABG:    Component Value Date/Time   PHART 7.254 (L) 03/07/2014 1019   PCO2ART 40.7 03/07/2014 1019   PO2ART 107.0 (H) 03/07/2014 1019   HCO3 18.0 (L) 03/07/2014 1019   TCO2 27 02/14/2020 1911   ACIDBASEDEF 9.0 (H) 03/07/2014 1019   O2SAT 97.0 03/07/2014 1019    Vent Settings: N/A  Condition -extremely guarded  Family Communication  : Spoke at length with father-he is aware of tenuous clinical condition and possible poor overall prognosis.  Code Status :  Full Code  Diet :  Diet Order            Diet NPO time specified  Diet effective now               Disposition Plan  :  Remain hospitalized  Barriers to discharge: CVA-aphasic-dysphagia-ongoing encephalopathy-fever  Antimicorbials  :    Anti-infectives (From admission, onward)   Start     Dose/Rate Route Frequency Ordered Stop   02/19/20 1400  abacavir-dolutegravir-lamiVUDine (TRIUMEQ) 600-50-300 MG per tablet 1 tablet     1 tablet Oral Daily 02/19/20 1358     02/19/20 1000  sulfamethoxazole-trimethoprim (BACTRIM) 200-40 MG/5ML suspension 20 mL       20 mL Per Tube Daily 02/19/20 0901     02/17/20 0000  bictegravir-emtricitabine-tenofovir AF (BIKTARVY) 50-200-25 MG TABS tablet     1 tablet Oral Daily 02/17/20 1250 03/18/20 2359   02/16/20 1000  remdesivir 100 mg in  sodium chloride 0.9 % 100 mL IVPB     100 mg 200 mL/hr over 30 Minutes Intravenous Daily 02/15/20 1016 02/19/20 0858   02/15/20 1100  remdesivir 200 mg in sodium chloride 0.9% 250 mL IVPB     200 mg 580 mL/hr over 30 Minutes Intravenous Once 02/15/20 1016 02/15/20 1150   02/15/20 1000  bictegravir-emtricitabine-tenofovir AF (BIKTARVY) 50-200-25 MG per tablet 1 tablet  Status:  Discontinued    Note to Pharmacy: Try to take at the same time each day with or without food.     1 tablet Oral Daily 02/14/20 2231 02/19/20 1358   02/15/20 1000  sulfamethoxazole-trimethoprim (BACTRIM DS) 800-160 MG per tablet 1 tablet  Status:  Discontinued     1 tablet Oral Daily 02/14/20 2231 02/19/20 0901   02/15/20 0530  vancomycin (VANCOCIN) IVPB 1000 mg/200 mL premix  Status:  Discontinued     1,000 mg 200 mL/hr over 60 Minutes Intravenous Every 8 hours 02/14/20 2120 02/15/20 0957   02/14/20 2215  penicillin G potassium 4 Million Units in dextrose 5 % 250 mL IVPB  Status:  Discontinued     4 Million Units 250 mL/hr over 60 Minutes Intravenous Every 4 hours 02/14/20 2202 02/15/20 1653   02/14/20 2200  ceFEPIme (MAXIPIME) 2 g in sodium chloride 0.9 % 100 mL IVPB  Status:  Discontinued     2 g 200 mL/hr over 30 Minutes Intravenous Every 8 hours 02/14/20 2120 02/14/20 2145   02/14/20 2130  vancomycin (VANCOREADY) IVPB 1500 mg/300 mL     1,500 mg 150 mL/hr over 120 Minutes Intravenous  Once 02/14/20 2120 02/15/20 0044   02/14/20 2115  ceFEPIme (MAXIPIME) 2 g in sodium chloride 0.9 % 100 mL IVPB  Status:  Discontinued     2 g 200 mL/hr over 30 Minutes Intravenous  Once 02/14/20 2103 02/14/20 2120   02/14/20 2115  metroNIDAZOLE (FLAGYL) IVPB 500 mg  Status:  Discontinued     500 mg 100 mL/hr  over 60 Minutes Intravenous  Once 02/14/20 2103 02/14/20 2120   02/14/20 2115  vancomycin (VANCOCIN) IVPB 1000 mg/200 mL premix  Status:  Discontinued     1,000 mg 200 mL/hr over 60 Minutes Intravenous  Once 02/14/20 2103 02/14/20 2120      Inpatient Medications  Scheduled Meds: .  stroke: mapping our early stages of recovery book   Does not apply Once  . abacavir-dolutegravir-lamiVUDine  1 tablet Oral Daily  . aspirin  325 mg Per NG tube Daily  . dexamethasone (DECADRON) injection  6 mg Intravenous Q24H  . enoxaparin (LOVENOX) injection  40 mg Subcutaneous Q24H  . feeding supplement (PRO-STAT SUGAR FREE 64)  30 mL Per Tube BID  . influenza vac split quadrivalent PF  0.5 mL Intramuscular Tomorrow-1000  . pneumococcal 23 valent vaccine  0.5 mL Intramuscular Tomorrow-1000  . sulfamethoxazole-trimethoprim  20 mL Per Tube Daily   Continuous Infusions: . dextrose 5% lactated ringers 10 mL/hr at 02/20/20 0400  . feeding supplement (OSMOLITE 1.5 CAL) 1,000 mL (02/20/20 0438)   PRN Meds:.acetaminophen **OR** acetaminophen (TYLENOL) oral liquid 160 mg/5 mL **OR** acetaminophen, senna-docusate   Time Spent in minutes  25  See all Orders from today for further details   Jeoffrey Massed M.D on 02/20/2020 at 11:36 AM  To page go to [email or web address] - use universal password  [facility] -  Office  [phone]    Objective:   Vitals:   02/20/20 0653 02/20/20 0800 02/20/20 0830 02/20/20 1030  BP:  127/75 115/69 110/65  Pulse:  (!) 116 (!) 122 96  Resp:  (!) 29 (!) 26 (!) 22  Temp: (!) 100.5 F (38.1 C) 100.3 F (37.9 C)    TempSrc: Oral     SpO2:  95% 96% 96%  Weight:      Height:        Wt Readings from Last 3 Encounters:  02/20/20 65.1 kg  01/28/20 69.9 kg  10/30/19 57.2 kg     Intake/Output Summary (Last 24 hours) at 02/20/2020 1136 Last data filed at 02/20/2020 0438 Gross per 24 hour  Intake 1725.65 ml  Output 1300 ml  Net 425.65 ml     Physical Exam Gen  Exam: Encephalopathic-only opens eyes to verbal commands.  Occasionally tracks my movement but really does not do much. HEENT:atraumatic, normocephalic Chest: B/L clear to auscultation anteriorly CVS:S1S2 regular Abdomen:soft non tender, non distended Extremities:no edema Neurology: Very difficult exam due to encephalopathy Skin: no rash   Data Review:    CBC Recent Labs  Lab 02/14/20 1905 02/14/20 1911 02/19/20 0450 02/19/20 1431 02/20/20 0541  WBC 11.4*  --  8.2 12.7* 9.0  HGB 10.6* 11.2* 11.1* 11.3* 11.3*  HCT 35.0* 33.0* 34.6* 35.2* 35.8*  PLT 561*  --  690* 710* 684*  MCV 95.4  --  90.8 91.7 92.7  MCH 28.9  --  29.1 29.4 29.3  MCHC 30.3  --  32.1 32.1 31.6  RDW 15.7*  --  15.2 15.6* 15.7*  LYMPHSABS 0.8  --   --  0.7  --   MONOABS 0.5  --   --  0.2  --   EOSABS 0.0  --   --  0.0  --   BASOSABS 0.1  --   --  0.0  --     Chemistries  Recent Labs  Lab 02/16/20 0828 02/17/20 0313 02/18/20 0336 02/19/20 0450 02/20/20 0541  NA 135 136 136 136 135  K 3.8 4.5 3.8 3.6 3.9  CL 101 99 103 101 100  CO2 24 23 22 23 26   GLUCOSE 102* 102* 371* 136* 106*  BUN 8 10 12 13 16   CREATININE 0.71 0.78 0.75 0.82 0.98  CALCIUM 8.7* 9.0 8.6* 8.4* 8.5*  AST 22 22 43* 60* 81*  ALT 10 11 18 29  32  ALKPHOS 43 48 41 57 72  BILITOT 0.6 0.3 <0.1* 0.3 0.5   ------------------------------------------------------------------------------------------------------------------ No results for input(s): CHOL, HDL, LDLCALC, TRIG, CHOLHDL, LDLDIRECT in the last 72 hours.  Lab Results  Component Value Date   HGBA1C 5.9 (H) 02/15/2020   ------------------------------------------------------------------------------------------------------------------ No results for input(s): TSH, T4TOTAL, T3FREE, THYROIDAB in the last 72 hours.  Invalid input(s): FREET3 ------------------------------------------------------------------------------------------------------------------ Recent Labs     02/19/20 0450 02/20/20 0541  FERRITIN 713* 745*    Coagulation profile Recent Labs  Lab 02/14/20 1905  INR 1.1    Recent Labs    02/19/20 0450 02/20/20 0541  DDIMER 2.94* 2.55*    Cardiac Enzymes No results for input(s): CKMB, TROPONINI, MYOGLOBIN in the last 168 hours.  Invalid input(s): CK ------------------------------------------------------------------------------------------------------------------ No results found for: BNP  Micro Results Recent Results (from the past 240 hour(s))  Culture, blood (Routine X 2) w Reflex to ID Panel     Status: None   Collection Time: 02/14/20  9:40 PM   Specimen: BLOOD  Result Value Ref Range Status   Specimen Description BLOOD RIGHT ARM  Final   Special Requests   Final    BOTTLES  DRAWN AEROBIC AND ANAEROBIC Blood Culture adequate volume   Culture   Final    NO GROWTH 5 DAYS Performed at [facility], [address] [place], [place] [address]    Report Status 02/19/2020 FINAL  Final  Culture, blood (Routine X 2) w Reflex to ID Panel     Status: None   Collection Time: 02/14/20  9:46 PM   Specimen: BLOOD  Result Value Ref Range Status   Specimen Description BLOOD RIGHT FOREARM  Final   Special Requests   Final    BOTTLES DRAWN AEROBIC AND ANAEROBIC Blood Culture adequate volume   Culture   Final    NO GROWTH 5 DAYS Performed at [facility], [address] [place], [place] [address]    Report Status 02/19/2020 FINAL  Final  Urine culture     Status: Abnormal   Collection Time: 02/14/20  9:46 PM   Specimen: In/Out Cath Urine  Result Value Ref Range Status   Specimen Description IN/OUT CATH URINE  Final   Special Requests   Final    NONE Performed at [facility], [address], [place], [place] [address]    Culture MULTIPLE SPECIES PRESENT, SUGGEST RECOLLECTION (A)  Final   Report Status 02/16/2020 FINAL  Final  SARS CORONAVIRUS 2 (TAT 6-24 HRS)     Status: Abnormal   Collection Time: 02/14/20  10:55 PM  Result Value Ref Range Status   SARS Coronavirus 2 POSITIVE (A) NEGATIVE Final    Comment: RESULT CALLED TO, READ BACK BY AND VERIFIED WITH: K. GUY,RN 0335 02/15/2020 T. TYSOR (NOTE) SARS-CoV-2 target nucleic acids are DETECTED. The SARS-CoV-2 RNA is generally detectable in upper and lower respiratory specimens during the acute phase of infection. Positive results are indicative of the presence of SARS-CoV-2 RNA. Clinical correlation with patient history and other diagnostic information is  necessary to determine patient infection status. Positive results do not rule out bacterial infection or co-infection with other viruses.  The expected result is Negative. Fact Sheet for Patients: HairSlick.no Fact Sheet for Healthcare Providers: quierodirigir.com This test is not yet approved or cleared by the [place] FDA and  has been authorized for detection and/or diagnosis of SARS-CoV-2 by FDA under an Emergency Use Authorization (EUA). This EUA will remain  in effect (meaning this test can be used) for the  duration of the COVID-19 declaration under Section 564(b)(1) of the Act, 21 U.S.C. section 360bbb-3(b)(1), unless the authorization is terminated or revoked sooner. Performed at [facility], [address] [place], [place] [address]   CSF culture with Stat gram stain     Status: None   Collection Time: 02/14/20 11:35 PM   Specimen: CSF; Cerebrospinal Fluid  Result Value Ref Range Status   Specimen Description CSF  Final   Special Requests NONE  Final   Gram Stain   Final    CYTOSPIN SMEAR WBC PRESENT,BOTH PMN AND MONONUCLEAR NO ORGANISMS SEEN    Culture   Final    NO GROWTH 3 DAYS Performed at [facility], [address] [place], [place] [address]    Report Status 02/18/2020 FINAL  Final    Radiology Reports EEG  Result Date: 02/15/2020 Charlsie Quest, MD     02/15/2020  4:13 PM Patient  Name: Yul Diana MRN: [id number] Epilepsy Attending: Charlsie Quest Referring Physician/Provider: Dr Georgiana Spinner Aroor Date: 02/15/2020 Duration: 23.26 mins Patient history: 33y male with HIV's, syphilis, cocaine abuse  brought to ED by EMS for generalized weakness x 2 days and developed sudden onset aphasia, possible right side weakness.  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 EEG to evaluate for seizure Level of alertness: asleep AEDs during EEG study: None Technical aspects: This EEG study was done with scalp electrodes positioned according to the 10-20 International system of electrode placement. Electrical activity was acquired at a sampling rate of 500Hz  and reviewed with a high frequency filter of 70Hz  and a low frequency filter of 1Hz . EEG data were recorded continuously and digitally stored. DESCRIPTION:  Sleep was characterized by vertex waves, sleep spindles, maximal frontocentral. Hyperventilation and photic stimulation were not performed. IMPRESSION: This study during sleep only is within normal limits. No seizures or epileptiform discharges were seen throughout the recording. If suspicion for interictal activity remains a concern, a prolonged study can be considered. Priyanka Annabelle Harman   CT ANGIO HEAD W OR WO CONTRAST  Result Date: 02/14/2020 CLINICAL DATA:  Stroke. Right-sided weakness and speech disturbance. EXAM: CT ANGIOGRAPHY HEAD AND NECK CT PERFUSION BRAIN TECHNIQUE: Multidetector CT imaging of the head and neck was performed using the standard protocol during bolus administration of intravenous contrast. Multiplanar CT image reconstructions and MIPs were obtained to evaluate the vascular anatomy. Carotid stenosis measurements (when applicable) are obtained utilizing NASCET criteria, using the distal internal carotid diameter as the denominator. Multiphase CT imaging of the brain was performed following  IV bolus contrast injection. Subsequent parametric perfusion maps were calculated using RAPID software. CONTRAST:  40mL OMNIPAQUE IOHEXOL 350 MG/ML SOLN; 75mL OMNIPAQUE IOHEXOL 350 MG/ML SOLN COMPARISON:  None. FINDINGS: CTA NECK FINDINGS Aortic arch: Standard 3 vessel aortic arch with widely patent arch vessel origins. Right carotid system: Patent and smooth without evidence of stenosis or dissection. Left carotid system: Patent and smooth without evidence of stenosis or dissection. Vertebral arteries: Patent, smooth, and codominant without evidence of stenosis or dissection. Skeleton: Poor dentition with multiple periapical erosions. Other neck: Borderline enlarged level II lymph nodes bilaterally. Upper chest: Centrilobular emphysema. Small superior mediastinal lymph nodes. Review of the MIP images confirms the above findings CTA HEAD FINDINGS Anterior circulation: The internal carotid arteries are widely patent from skull base to carotid termini. The A1 and M1 segments are widely patent with the left A1 segment being mildly hypoplastic. There is a decreased number of branch vessels in the right MCA superior division with suggestion of occlusion and intermittent reconstitution of distal M2 and M3 branches. There is also multifocal irregular narrowing of M3 branches in the right MCA inferior division, and there is mild left MCA branch vessel irregularity. No aneurysm is identified. Posterior circulation: The intracranial vertebral arteries are widely patent to the basilar. The basilar artery is widely patent. There are small posterior communicating arteries bilaterally. PCAs are patent with mild branch vessel irregularity more notable on the right without evidence of flow limiting proximal stenosis. No aneurysm is identified. Venous sinuses: Patent. Anatomic variants: None. Review of the MIP images confirms the above findings CT Brain Perfusion Findings: ASPECTS: 4 CBF (<30%) Volume: 0mL Perfusion (Tmax>6.0s)  volume: 12mL Mismatch Volume: 12mL, however the core infarct on noncontrast head CT is not being picked up by the automated processing and the true penumbra is less than 12 mL Infarction Location: Right MCA IMPRESSION: 1. Multiple missing right MCA branch vessels in the superior division with wide patency of the M1 segment and no discrete proximal M2 occlusion  identified. 2. Widely patent common carotid, internal carotid, and vertebral arteries. 3. CT perfusion demonstrating at most a small penumbra as detailed above. These results were called by telephone at the time of interpretation on 02/14/2020 at 7:55 p.m. to Dr. Laurence Slate, who verbally acknowledged these results. Electronically Signed   By: Sebastian Ache M.D.   On: 02/14/2020 20:22   CT HEAD WO CONTRAST  Result Date: 02/19/2020 CLINICAL DATA:  Stroke, follow-up. EXAM: CT HEAD WITHOUT CONTRAST TECHNIQUE: Contiguous axial images were obtained from the base of the skull through the vertex without intravenous contrast. COMPARISON:  Head CT 02/07/2020, brain MRI 02/15/2020 FINDINGS: Brain: Again demonstrated are, now subacute, infarcts within the right basal ganglia, right internal capsule, right frontal operculum, right insula and subinsular region as well as left basal/internal capsule. These infarcts have not significant changed in extent as compared to prior head CT 02/15/2020. No significant mass effect. No midline shift. No evidence of hemorrhagic conversion. No new demarcated infarct is identified. No evidence of intracranial mass. No extra-axial fluid collection. Stable, mild generalized parenchymal atrophy. Vascular: Redemonstrated hyperdense right MCA branch within the right sylvian fissure. Skull: Normal. Negative for fracture or focal lesion. Sinuses/Orbits: Visualized orbits demonstrate no acute abnormality. Mild ethmoid sinus mucosal thickening. No significant mastoid effusion at the imaged levels IMPRESSION: Subacute infarcts within the right MCA  territory as well as left basal ganglia/internal capsule, unchanged in extent as compared to head CT 02/15/2020. No significant mass effect. No hemorrhagic conversion. No interval infarct is identified. Stable, mild generalized parenchymal atrophy. Mild ethmoid sinus mucosal thickening. Electronically Signed   By: Jackey Loge DO   On: 02/19/2020 10:33   CT HEAD WO CONTRAST  Result Date: 02/15/2020 CLINICAL DATA:  Stroke follow-up. COVID-19 infection. History of HIV. EXAM: CT HEAD WITHOUT CONTRAST TECHNIQUE: Contiguous axial images were obtained from the base of the skull through the vertex without intravenous contrast. COMPARISON:  Head MRI 02/15/2020 FINDINGS: Brain: Acute right MCA infarcts involving the basal ganglia, internal capsule, insula, and frontal operculum are unchanged as are small acute left basal ganglia and left internal capsule infarcts. A subcentimeter focus of hemorrhage in the right frontal operculum is unchanged. No new infarct, new intracranial hemorrhage, midline shift, or extra-axial fluid collection is identified. Mild ventricular prominence for age is unchanged and likely reflective of mild cerebral atrophy. Vascular: Hyperdense right MCA branch vessels in the sylvian fissure. Skull: No fracture or suspicious osseous lesion. Sinuses/Orbits: Mild right sphenoid sinus mucosal thickening. Small right maxillary sinus mucous retention cyst. Clear mastoid air cells. Unremarkable orbits. Other: None. IMPRESSION: 1. Unchanged acute right MCA infarcts with unchanged small hemorrhage in the right frontal operculum. 2. Unchanged small acute left basal ganglia and internal capsule infarcts. 3. No new intracranial abnormality. Electronically Signed   By: Sebastian Ache M.D.   On: 02/15/2020 19:40   CT ANGIO NECK W OR WO CONTRAST  Result Date: 02/14/2020 CLINICAL DATA:  Stroke. Right-sided weakness and speech disturbance. EXAM: CT ANGIOGRAPHY HEAD AND NECK CT PERFUSION BRAIN TECHNIQUE:  Multidetector CT imaging of the head and neck was performed using the standard protocol during bolus administration of intravenous contrast. Multiplanar CT image reconstructions and MIPs were obtained to evaluate the vascular anatomy. Carotid stenosis measurements (when applicable) are obtained utilizing NASCET criteria, using the distal internal carotid diameter as the denominator. Multiphase CT imaging of the brain was performed following IV bolus contrast injection. Subsequent parametric perfusion maps were calculated using RAPID software. CONTRAST:  40mL OMNIPAQUE IOHEXOL 350  MG/ML SOLN; 75mL OMNIPAQUE IOHEXOL 350 MG/ML SOLN COMPARISON:  None. FINDINGS: CTA NECK FINDINGS Aortic arch: Standard 3 vessel aortic arch with widely patent arch vessel origins. Right carotid system: Patent and smooth without evidence of stenosis or dissection. Left carotid system: Patent and smooth without evidence of stenosis or dissection. Vertebral arteries: Patent, smooth, and codominant without evidence of stenosis or dissection. Skeleton: Poor dentition with multiple periapical erosions. Other neck: Borderline enlarged level II lymph nodes bilaterally. Upper chest: Centrilobular emphysema. Small superior mediastinal lymph nodes. Review of the MIP images confirms the above findings CTA HEAD FINDINGS Anterior circulation: The internal carotid arteries are widely patent from skull base to carotid termini. The A1 and M1 segments are widely patent with the left A1 segment being mildly hypoplastic. There is a decreased number of branch vessels in the right MCA superior division with suggestion of occlusion and intermittent reconstitution of distal M2 and M3 branches. There is also multifocal irregular narrowing of M3 branches in the right MCA inferior division, and there is mild left MCA branch vessel irregularity. No aneurysm is identified. Posterior circulation: The intracranial vertebral arteries are widely patent to the basilar. The  basilar artery is widely patent. There are small posterior communicating arteries bilaterally. PCAs are patent with mild branch vessel irregularity more notable on the right without evidence of flow limiting proximal stenosis. No aneurysm is identified. Venous sinuses: Patent. Anatomic variants: None. Review of the MIP images confirms the above findings CT Brain Perfusion Findings: ASPECTS: 4 CBF (<30%) Volume: 0mL Perfusion (Tmax>6.0s) volume: 12mL Mismatch Volume: 12mL, however the core infarct on noncontrast head CT is not being picked up by the automated processing and the true penumbra is less than 12 mL Infarction Location: Right MCA IMPRESSION: 1. Multiple missing right MCA branch vessels in the superior division with wide patency of the M1 segment and no discrete proximal M2 occlusion identified. 2. Widely patent common carotid, internal carotid, and vertebral arteries. 3. CT perfusion demonstrating at most a small penumbra as detailed above. These results were called by telephone at the time of interpretation on 02/14/2020 at 7:55 p.m. to Dr. Laurence SlateAroor, who verbally acknowledged these results. Electronically Signed   By: Sebastian AcheAllen  Grady M.D.   On: 02/14/2020 20:22   MR BRAIN WO CONTRAST  Result Date: 02/15/2020 CLINICAL DATA:  Stroke.  HIV. EXAM: MRI HEAD WITHOUT CONTRAST TECHNIQUE: Multiplanar, multiecho pulse sequences of the brain and surrounding structures were obtained without intravenous contrast. COMPARISON:  CT head 02/14/2020 FINDINGS: Brain: Acute right MCA infarct involving the right lenticular nucleus as well as the right frontal operculum and right parietal operculum. Small area of acute infarction in the genu internal capsule on the right. Small area of associated hemorrhage in the right frontal operculum and right parietal operculum. Small areas of acute infarct in the left basal ganglia involving the genu internal capsule and posterior external capsule fibers. Ventricle size normal.  No midline  shift.  Negative for mass lesion. Vascular: Normal arterial flow voids. Skull and upper cervical spine: No focal skeletal lesion. Sinuses/Orbits: Mild mucosal edema paranasal sinuses. Negative orbit Other: None IMPRESSION: Acute infarct right MCA territory involving the basal ganglia as well as the right frontal and parietal operculum where there are small areas of associated hemorrhage. Small area of acute infarct in the left internal capsule and external capsule. Electronically Signed   By: Marlan Palauharles  Clark M.D.   On: 02/15/2020 07:27   CT CEREBRAL PERFUSION W CONTRAST  Result Date: 02/14/2020 CLINICAL DATA:  Stroke. Right-sided weakness and speech disturbance. EXAM: CT ANGIOGRAPHY HEAD AND NECK CT PERFUSION BRAIN TECHNIQUE: Multidetector CT imaging of the head and neck was performed using the standard protocol during bolus administration of intravenous contrast. Multiplanar CT image reconstructions and MIPs were obtained to evaluate the vascular anatomy. Carotid stenosis measurements (when applicable) are obtained utilizing NASCET criteria, using the distal internal carotid diameter as the denominator. Multiphase CT imaging of the brain was performed following IV bolus contrast injection. Subsequent parametric perfusion maps were calculated using RAPID software. CONTRAST:  59mL OMNIPAQUE IOHEXOL 350 MG/ML SOLN; 35mL OMNIPAQUE IOHEXOL 350 MG/ML SOLN COMPARISON:  None. FINDINGS: CTA NECK FINDINGS Aortic arch: Standard 3 vessel aortic arch with widely patent arch vessel origins. Right carotid system: Patent and smooth without evidence of stenosis or dissection. Left carotid system: Patent and smooth without evidence of stenosis or dissection. Vertebral arteries: Patent, smooth, and codominant without evidence of stenosis or dissection. Skeleton: Poor dentition with multiple periapical erosions. Other neck: Borderline enlarged level II lymph nodes bilaterally. Upper chest: Centrilobular emphysema. Small superior  mediastinal lymph nodes. Review of the MIP images confirms the above findings CTA HEAD FINDINGS Anterior circulation: The internal carotid arteries are widely patent from skull base to carotid termini. The A1 and M1 segments are widely patent with the left A1 segment being mildly hypoplastic. There is a decreased number of branch vessels in the right MCA superior division with suggestion of occlusion and intermittent reconstitution of distal M2 and M3 branches. There is also multifocal irregular narrowing of M3 branches in the right MCA inferior division, and there is mild left MCA branch vessel irregularity. No aneurysm is identified. Posterior circulation: The intracranial vertebral arteries are widely patent to the basilar. The basilar artery is widely patent. There are small posterior communicating arteries bilaterally. PCAs are patent with mild branch vessel irregularity more notable on the right without evidence of flow limiting proximal stenosis. No aneurysm is identified. Venous sinuses: Patent. Anatomic variants: None. Review of the MIP images confirms the above findings CT Brain Perfusion Findings: ASPECTS: 4 CBF (<30%) Volume: 36mL Perfusion (Tmax>6.0s) volume: 36mL Mismatch Volume: 76mL, however the core infarct on noncontrast head CT is not being picked up by the automated processing and the true penumbra is less than 12 mL Infarction Location: Right MCA IMPRESSION: 1. Multiple missing right MCA branch vessels in the superior division with wide patency of the M1 segment and no discrete proximal M2 occlusion identified. 2. Widely patent common carotid, internal carotid, and vertebral arteries. 3. CT perfusion demonstrating at most a small penumbra as detailed above. These results were called by telephone at the time of interpretation on 02/14/2020 at 7:55 p.m. to Dr. Lorraine Lax, who verbally acknowledged these results. Electronically Signed   By: Logan Bores M.D.   On: 02/14/2020 20:22   DG Chest Port 1  View  Result Date: 02/14/2020 CLINICAL DATA:  Acute CVA. Evaluate for pneumonia. EXAM: PORTABLE CHEST 1 VIEW COMPARISON:  Radiograph 07/07/2019. Lung apices from CT angiography earlier today. FINDINGS: Ill-defined opacity in the right mid lung abutting the minor fissure. Mild cardiomegaly. Normal mediastinal contours. No pulmonary edema, large pleural effusion or pneumothorax. No acute osseous abnormalities are seen. IMPRESSION: Ill-defined opacity in the right mid lung abutting the minor fissure, suspicious for pneumonia. Mild cardiomegaly. Electronically Signed   By: Keith Rake M.D.   On: 02/14/2020 21:58   DG Chest Port 1V same Day  Result Date: 02/19/2020 CLINICAL DATA:  COVID-19 positive, shortness of breath EXAM:  PORTABLE CHEST 1 VIEW COMPARISON:  02/14/2020 FINDINGS: Interval placement of enteric tube coursing below the diaphragm, distal tip beyond the inferior margin of the film. Cardiomediastinal contours are stable. Subtle opacity within the right mid lung is again seen abutting the minor fissure, slightly improved from prior. No new focal airspace consolidation. No pleural effusion or pneumothorax. IMPRESSION: 1. Interval placement of enteric tube, distal tip beyond the inferior margin of the film. 2. Slight interval improvement in right mid lung opacity. Electronically Signed   By: Duanne Guess D.O.   On: 02/19/2020 08:54   DG Abd Portable 1V  Result Date: 02/15/2020 CLINICAL DATA:  MRI clearance EXAM: PORTABLE ABDOMEN - 1 VIEW COMPARISON:  None. FINDINGS: The bowel gas pattern is normal. No radio-opaque calculi or other significant radiographic abnormality are seen. IMPRESSION: No radial pain metallic foreign body in the abdomen or pelvis. Electronically Signed   By: Elige Ko   On: 02/15/2020 05:57   EEG adult  Result Date: 02/19/2020 Charlsie Quest, MD     02/19/2020  6:27 PM Patient Name: Daiki Dicostanzo MRN: [id number] Epilepsy Attending: Charlsie Quest Referring  Physician/Provider: Dr Jeoffrey Massed Date: 02/19/2020 Duration: 23.23 mins Patient history: 33yo M with acute R MCA stroke as well as L basal ganglia/internal capsue stroke who continues to be encephalopathic. EEG to evaluate for seizure. Level of alertness: awake AEDs during EEG study: None Technical aspects: This EEG study was done with scalp electrodes positioned according to the 10-20 International system of electrode placement. Electrical activity was acquired at a sampling rate of 500Hz  and reviewed with a high frequency filter of 70Hz  and a low frequency filter of 1Hz . EEG data were recorded continuously and digitally stored. DESCRIPTION: The posterior dominant rhythm consists of 8 Hz activity of moderate voltage (25-35 uV) seen predominantly in posterior head regions, symmetric and reactive to eye opening and eye closing. EEG also showed intermittent generalized and maximal right frontotemporal region 3-6hz  theta-delta slowing. Hyperventilation and photic stimulation were not performed. ABNORMALITY - Intermittent slow, generalized and maximal right frontotemporal region IMPRESSION: This study is suggestive of cortical dysfunction in right frontotemporal region likely secondary to underlying stroke as well as mild diffuse encephalopathy, non specific to etiology. No seizures or epileptiform discharges were seen throughout the recording. Priyanka O Yadav   VAS [place] TRANSCRANIAL DOPPLER W BUBBLES  Result Date: 02/17/2020  Transcranial Doppler with Bubble Indications: PFO. Comparison Study: No prior studies. Performing Technologist: Chanda Busing RVT  Examination Guidelines: A complete evaluation includes B-mode imaging, spectral Doppler, color Doppler, and power Doppler as needed of all accessible portions of each vessel. Bilateral testing is considered an integral part of a complete examination. Limited examinations for reoccurring indications may be performed as noted.  Summary:  A vascular evaluation was  performed. The right P2 was studied. An IV was inserted into the patient's right Brachial. Verbal informed consent was obtained. No HITS. Negative for PFO.  Negative TCD Bubble study .No indication of right to left shunt noted. *See table(s) above for TCD measurements and observations.  Diagnosing physician: Delia Heady MD Electronically signed by Delia Heady MD on 02/17/2020 at 12:36:13 PM.    Final    CT HEAD CODE STROKE WO CONTRAST  Result Date: 02/14/2020 CLINICAL DATA:  Code stroke.  Right-sided weakness. EXAM: CT HEAD WITHOUT CONTRAST TECHNIQUE: Contiguous axial images were obtained from the base of the skull through the vertex without intravenous contrast. COMPARISON:  03/07/2014 FINDINGS: Brain: There is an acute right basal  ganglia infarct involving the caudate nucleus, lentiform nucleus, and a portion of the anterior limb of the right internal capsule. There is a subcentimeter focus of acute hemorrhage superficially in the right frontal operculum with a small amount of surrounding low-density, and there is also loss of gray-white differentiation indicative of an acute infarct in the anterior aspect of the right insula and more anterior aspect of the right frontal lobe. There is a new age indeterminate lacunar infarct in the genu of the left internal capsule. The ventricles are normal in size. There is no midline shift or extra-axial fluid collection. Vascular: Hyperdense right MCA. Skull: No fracture or suspicious osseous lesion. Sinuses/Orbits: Partially visualized small mucous retention cyst in the right maxillary sinus. Clear mastoid air cells. Rightward gaze. Other: None. ASPECTS [facility] Stroke Program Early CT Score) - Ganglionic level infarction (caudate, lentiform nuclei, internal capsule, insula, M1-M3 cortex): 1 - Supraganglionic infarction (M4-M6 cortex): 3 Total score (0-10 with 10 being normal): 4 IMPRESSION: 1. Acute right MCA infarct involving the frontal lobe, insula, and basal ganglia  with small focus of hemorrhage in the right frontal operculum. 2. ASPECTS is 4. 3. 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 These results were communicated to Dr. Laurence Slate at 7:29 pmon 02/14/2020 by text page via the [facility] messaging system. Electronically Signed   By: Sebastian Ache M.D.   On: 02/14/2020 19:30   VAS [place] UPPER EXTREMITY VENOUS DUPLEX  Result Date: 02/18/2020 UPPER VENOUS STUDY  Indications: Swelling Limitations: Patient positioning. Comparison Study: no prior Performing Technologist: Blanch Media RVS  Examination Guidelines: A complete evaluation includes B-mode imaging, spectral Doppler, color Doppler, and power Doppler as needed of all accessible portions of each vessel. Bilateral testing is considered an integral part of a complete examination. Limited examinations for reoccurring indications may be performed as noted.  Right Findings: +----------+------------+---------+-----------+----------+--------------+ RIGHT     CompressiblePhasicitySpontaneousProperties   Summary     +----------+------------+---------+-----------+----------+--------------+ IJV                                                 Not visualized +----------+------------+---------+-----------+----------+--------------+ Subclavian    Full       Yes       Yes                             +----------+------------+---------+-----------+----------+--------------+ Axillary      Full       Yes       Yes                             +----------+------------+---------+-----------+----------+--------------+ Brachial      Full       Yes       Yes                             +----------+------------+---------+-----------+----------+--------------+ Radial        Full                                                 +----------+------------+---------+-----------+----------+--------------+ Ulnar  Full                                                  +----------+------------+---------+-----------+----------+--------------+ Cephalic      Full                                                 +----------+------------+---------+-----------+----------+--------------+ Basilic       Full                                                 +----------+------------+---------+-----------+----------+--------------+  Summary:  Right: No evidence of deep vein thrombosis in the upper extremity. No evidence of superficial vein thrombosis in the upper extremity.  *See table(s) above for measurements and observations.  Diagnosing physician: Gretta Began MD Electronically signed by Gretta Began MD on 02/18/2020 at 6:41:36 PM.    Final    ECHOCARDIOGRAM LIMITED  Result Date: 02/15/2020    ECHOCARDIOGRAM LIMITED REPORT   Patient Name:   HOYLE BARKDULL Date of Exam: 02/15/2020 Medical Rec #:  [id number]      Height:       68.0 in Accession #:    [id number]     Weight:       152.1 lb Date of Birth:  1986/04/15       BSA:          1.819 m Patient Age:    33 years       BP:           139/86 mmHg Patient Gender: M              HR:           78 bpm. Exam Location:  Inpatient Procedure: Limited Echo, Limited Color Doppler and Cardiac Doppler Indications:    Stroke 434.91 / I163.9                 Endocarditis I38  History:        Patient has no prior history of Echocardiogram examinations.                 HIV.  Sonographer:    Leta Jungling RDCS Referring Phys: [id number] TIMOTHY S OPYD IMPRESSIONS  1. Left ventricular ejection fraction, by estimation, is 55 to 60%. The left ventricle has normal function. The left ventricle has no regional wall motion abnormalities. Left ventricular diastolic parameters were normal.  2. The mitral valve is grossly normal. Trivial mitral valve regurgitation. No evidence of mitral stenosis.  3. The aortic valve is tricuspid. Aortic valve regurgitation is not visualized. No aortic stenosis is present.  4. There is normal pulmonary artery systolic pressure.  The estimated right ventricular systolic pressure is 24.0 mmHg.  5. The inferior vena cava is normal in size with greater than 50% respiratory variability, suggesting right atrial pressure of 3 mmHg. Conclusion(s)/Recommendation(s): No evidence of valvular vegetations on this transthoracic echocardiogram. Would recommend a transesophageal echocardiogram to exclude infective endocarditis if clinically indicated. FINDINGS  Left Ventricle: Left ventricular ejection fraction, by estimation, is 55 to 60%. The left ventricle has normal function.  The left ventricle has no regional wall motion abnormalities. The left ventricular internal cavity size was normal in size. There is  no left ventricular hypertrophy. Right Ventricle: There is normal pulmonary artery systolic pressure. The tricuspid regurgitant velocity is 2.29 m/s, and with an assumed right atrial pressure of 3 mmHg, the estimated right ventricular systolic pressure is 24.0 mmHg. Left Atrium: Left atrial size was normal in size. Right Atrium: Right atrial size was normal in size. Pericardium: A small pericardial effusion is present. The pericardial effusion is circumferential. Mitral Valve: The mitral valve is grossly normal. Trivial mitral valve regurgitation. No evidence of mitral valve stenosis. There is no evidence of mitral valve vegetation. Tricuspid Valve: The tricuspid valve is grossly normal. Tricuspid valve regurgitation is trivial. No evidence of tricuspid stenosis. There is no evidence of tricuspid valve vegetation. Aortic Valve: The aortic valve is tricuspid. Aortic valve regurgitation is not visualized. No aortic stenosis is present. There is no evidence of aortic valve vegetation. Pulmonic Valve: The pulmonic valve was grossly normal. Pulmonic valve regurgitation is trivial. No evidence of pulmonic stenosis. There is no evidence of pulmonic valve vegetation. Venous: The inferior vena cava is normal in size with greater than 50% respiratory  variability, suggesting right atrial pressure of 3 mmHg. IAS/Shunts: The atrial septum is grossly normal.  LEFT VENTRICLE PLAX 2D LVIDd:         5.00 cm      Diastology LVIDs:         3.50 cm      LV e' lateral:   12.70 cm/s LV PW:         1.00 cm      LV E/e' lateral: 5.0 LV IVS:        1.00 cm      LV e' medial:    12.50 cm/s                             LV E/e' medial:  5.1  LV Volumes (MOD) LV vol d, MOD A2C: 151.0 ml LV vol d, MOD A4C: 177.0 ml LV vol s, MOD A2C: 73.1 ml LV vol s, MOD A4C: 68.9 ml LV SV MOD A2C:     77.9 ml LV SV MOD A4C:     177.0 ml LV SV MOD BP:      94.8 ml LEFT ATRIUM             Index       RIGHT ATRIUM           Index LA diam:        3.30 cm 1.81 cm/m  RA Area:     14.30 cm LA Vol (A2C):   54.3 ml 29.85 ml/m RA Volume:   36.30 ml  19.95 ml/m LA Vol (A4C):   44.8 ml 24.62 ml/m LA Biplane Vol: 53.9 ml 29.63 ml/m  AORTIC VALVE LVOT Vmax:   104.00 cm/s LVOT Vmean:  72.900 cm/s LVOT VTI:    0.168 m  AORTA Ao Root diam: 3.50 cm MITRAL VALVE               TRICUSPID VALVE MV Area (PHT): 2.66 cm    TR Peak grad:   21.0 mmHg MV Decel Time: 285 msec    TR Vmax:        229.00 cm/s MV E velocity: 63.40 cm/s MV A velocity: 75.80 cm/s  SHUNTS MV E/A ratio:  0.84  Systemic VTI: 0.17 m Lennie Odor MD Electronically signed by Lennie Odor MD Signature Date/Time: 02/15/2020/1:27:05 PM    Final

## 2020-02-20 NOTE — Progress Notes (Addendum)
Physical Therapy Treatment Patient Details Name: Tyler Vargas MRN: [id number] DOB: 01-Aug-1986 Today's Date: 02/20/2020    History of Present Illness 34 year old transgender adult with medical history significant for HIV/AIDS with CD4 count 84 and viral load 1.8 million earlier this month, latent syphilis, and cocaine abuse, now presenting to the emergency department with weakness. Pt aphasic and initially not following commands in ED, eventually able to follow commands but not provide history. 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 Acute CVA thought to be secondary to hypercoagulable state from Covid 19 and cocaine use. Pt also + COVID. Admitted 02/14/20 for treatment of acute CVA.  Echo without embolic source, CTA neck without any significant extracranial stenosis, transcranial Doppler negative for PFO. Neurology recommends to continue aspirin. Patient on Enoxaparin for DVT prophylaxis. Negative for DVT RUE 02/18/20.    PT Comments    Per discussion with RN earlier in AM, patient cleared to participate in PT session to tolerance. Nurse noted patient with HR 120s in AM but upon PT entrance for PT session later in the morning, HR in 80s bpm. Patient with fever, work-up ongoing, ID following. ID MD present at end of session. Patient sat EOB with two person assist. Patient opened eyes on command with light sternal rub while sitting EOB but as fatigued, would not open eyes to command. Patient assisted back to supine. Patient unable to lift head in sitting and requires assist. HR, RR, O2 saturation stable. BP siting EOB: 112/72. BP recycled on its own later in session and was 90/75. At end of session when patient back in bed supine, HR 77 bpm, RR 21, O2 sat 93% on room air.  Patient would benefit from OOB via hoyer lift to  recliner chair with assistance from nursing staff. Chair set-up and hoyer pad placed in room. Continued recommendation for skilled PT services and discharge to SNF for short term rehabilitation.   Follow Up Recommendations  SNF;Supervision/Assistance - 24 hour     Equipment Recommendations  Other (comment);Hospital bed(TBD at next level of care)       Precautions / Restrictions Precautions Precautions: Fall;Other (comment) Precaution Comments: Aspiration precautions, NPO, HOB>30 degrees Restrictions Weight Bearing Restrictions: No    Mobility  Bed Mobility Overal bed mobility: Needs Assistance Bed Mobility: Supine to Sit;Sit to Supine;Rolling Rolling: Max assist;Total assist;+2 for physical assistance;+2 for safety/equipment   Supine to sit: Total assist;HOB elevated;+2 for physical assistance;+2 for safety/equipment Sit to supine: Total assist;+2 for physical assistance;+2 for safety/equipment;HOB elevated              Balance Overall balance assessment: Needs assistance Sitting-balance support: Bilateral upper extremity supported Sitting balance-Leahy Scale: Poor Sitting balance - Comments: Patient required assistance to maintain sitting EOB approx 4 minutes. HR, O2 saturation, RR stable. BP sitting EOB 112/72. BP automatically retook later in session and BP 90/75. Postural control: Posterior lean     Standing balance comment: not safe to attempt due to poor sitting balance     Cognition Arousal/Alertness: Lethargic Behavior During Therapy: Flat affect     General Comments: Patient did open eyes to command x 3 times with light sternal rub and maintained open eyes when sitting EOB a few minutes until fatigued and then eyes closed and only L eye would minimally open on command once so patient assisted back to supine  Pertinent Vitals/Pain Pain Assessment: Faces Faces Pain Scale: No hurt           PT Goals (current goals can now be found in the  care plan section) Progress towards PT goals: Progressing toward goals    Frequency    Min 3X/week      PT Plan Current plan remains appropriate       AM-PAC PT "6 Clicks" Mobility   Outcome Measure  Help needed turning from your back to your side while in a flat bed without using bedrails?: Total Help needed moving from lying on your back to sitting on the side of a flat bed without using bedrails?: Total Help needed moving to and from a bed to a chair (including a wheelchair)?: Total Help needed standing up from a chair using your arms (e.g., wheelchair or bedside chair)?: Total Help needed to walk in hospital room?: Total Help needed climbing 3-5 steps with a railing? : Total 6 Click Score: 6    End of Session   Activity Tolerance: Patient limited by lethargy Patient left: in bed;with call bell/phone within reach;with bed alarm set Nurse Communication: Mobility status;Other (comment)(BP via secure chat; discussed with ID MD as well in person) PT Visit Diagnosis: Unsteadiness on feet (R26.81);Other abnormalities of gait and mobility (R26.89);Muscle weakness (generalized) (M62.81);Difficulty in walking, not elsewhere classified (R26.2);Hemiplegia and hemiparesis     Time: 0277-4128 PT Time Calculation (min) (ACUTE ONLY): 19 min  Charges:  $Therapeutic Activity: 8-22 mins                     Birdie Hopes, PT, DPT Acute Rehab [phone] office     Birdie Hopes 02/20/2020, 1:46 PM

## 2020-02-20 NOTE — Progress Notes (Signed)
Patient ID: Tyler Vargas, adult   DOB: 1986-11-05, 34 y.o.   MRN: [id number]         [facility] for Infectious Disease  Date of Admission:  02/14/2020     ASSESSMENT: The cause of her recurrent fever remains uncertain but I am most suspicious of inflammatory response secondary to Covid and poorly controlled HIV infection.  I do not see any clear evidence of other new infection for drug allergy.  PLAN: 1. Observe off of antibiotics (except prophylactic trimethoprim sulfamethoxazole) 2. Continue Triumeq 3. Please call me for any infectious disease questions this weekend  Principal Problem:   Fever Active Problems:   AIDS (acquired immune deficiency syndrome) (HCC)   Acute CVA (cerebrovascular accident) (HCC)   COVID-19   History of syphilis   Cocaine use disorder, mild, abuse (HCC)   Scheduled Meds: .  stroke: mapping our early stages of recovery book   Does not apply Once  . abacavir-dolutegravir-lamiVUDine  1 tablet Oral Daily  . aspirin  325 mg Per NG tube Daily  . enoxaparin (LOVENOX) injection  40 mg Subcutaneous Q24H  . feeding supplement (PRO-STAT SUGAR FREE 64)  30 mL Per Tube BID  . influenza vac split quadrivalent PF  0.5 mL Intramuscular Tomorrow-1000  . pneumococcal 23 valent vaccine  0.5 mL Intramuscular Tomorrow-1000  . sulfamethoxazole-trimethoprim  20 mL Per Tube Daily   Continuous Infusions: . dextrose 5% lactated ringers 10 mL/hr at 02/20/20 0400  . feeding supplement (OSMOLITE 1.5 CAL) 1,000 mL (02/20/20 0438)   PRN Meds:.acetaminophen **OR** acetaminophen (TYLENOL) oral liquid 160 mg/5 mL **OR** acetaminophen, senna-docusate   Review of Systems: Review of Systems  Unable to perform ROS: Mental acuity    No Known Allergies  OBJECTIVE: Vitals:   02/20/20 1100 02/20/20 1156 02/20/20 1200 02/20/20 1234  BP: 108/72  (!) 91/59 98/64  Pulse: 86  76 71  Resp: (!) 25  18 18   Temp:  (!) 97.3 F (36.3 C)    TempSrc:  Axillary    SpO2: 95%  96%  98%  Weight:      Height:       Body mass index is 21.82 kg/m.  Physical Exam Constitutional:      Comments: She was sitting on the side of her bed with maximal assist from her physical therapist when I entered the room.  The therapist said that she would open her eyes on initially could not follow other commands.  Cardiovascular:     Rate and Rhythm: Normal rate and regular rhythm.     Heart sounds: No murmur.  Pulmonary:     Effort: Pulmonary effort is normal.     Breath sounds: Normal breath sounds.  Abdominal:     Palpations: Abdomen is soft. There is no mass.     Tenderness: There is no abdominal tenderness.     Comments: No diarrhea.  Musculoskeletal:        General: No swelling or tenderness.  Skin:    Findings: No rash.  Neurological:     Motor: Weakness present.     Comments: No change in right-sided weakness.     Lab Results Lab Results  Component Value Date   WBC 9.0 02/20/2020   HGB 11.3 (L) 02/20/2020   HCT 35.8 (L) 02/20/2020   MCV 92.7 02/20/2020   PLT 684 (H) 02/20/2020    Lab Results  Component Value Date   CREATININE 0.98 02/20/2020   BUN 16 02/20/2020   NA 135 02/20/2020  K 3.9 02/20/2020   CL 100 02/20/2020   CO2 26 02/20/2020    Lab Results  Component Value Date   ALT 32 02/20/2020   AST 81 (H) 02/20/2020   ALKPHOS 72 02/20/2020   BILITOT 0.5 02/20/2020     Microbiology: Recent Results (from the past 240 hour(s))  Culture, blood (Routine X 2) w Reflex to ID Panel     Status: None   Collection Time: 02/14/20  9:40 PM   Specimen: BLOOD  Result Value Ref Range Status   Specimen Description BLOOD RIGHT ARM  Final   Special Requests   Final    BOTTLES DRAWN AEROBIC AND ANAEROBIC Blood Culture adequate volume   Culture   Final    NO GROWTH 5 DAYS Performed at [facility], [address] [place], [place] [address]    Report Status 02/19/2020 FINAL  Final  Culture, blood (Routine X 2) w Reflex to ID Panel     Status: None    Collection Time: 02/14/20  9:46 PM   Specimen: BLOOD  Result Value Ref Range Status   Specimen Description BLOOD RIGHT FOREARM  Final   Special Requests   Final    BOTTLES DRAWN AEROBIC AND ANAEROBIC Blood Culture adequate volume   Culture   Final    NO GROWTH 5 DAYS Performed at [facility], [place] [address] [place], [place] [address]    Report Status 02/19/2020 FINAL  Final  Urine culture     Status: Abnormal   Collection Time: 02/14/20  9:46 PM   Specimen: In/Out Cath Urine  Result Value Ref Range Status   Specimen Description IN/OUT CATH URINE  Final   Special Requests   Final    NONE Performed at [facility], [place] [address], [place],  [address]    Culture MULTIPLE SPECIES PRESENT, SUGGEST RECOLLECTION (A)  Final   Report Status 02/16/2020 FINAL  Final  SARS CORONAVIRUS 2 (TAT 6-24 HRS)     Status: Abnormal   Collection Time: 02/14/20 10:55 PM  Result Value Ref Range Status   SARS Coronavirus 2 POSITIVE (A) NEGATIVE Final    Comment: RESULT CALLED TO, READ BACK BY AND VERIFIED WITH: K. GUY,RN [id number] 02/15/2020 T. TYSOR (NOTE) SARS-CoV-2 target nucleic acids are DETECTED. The SARS-CoV-2 RNA is generally detectable in upper and lower respiratory specimens during the acute phase of infection. Positive results are indicative of the presence of SARS-CoV-2 RNA. Clinical correlation with patient history and other diagnostic information is  necessary to determine patient infection status. Positive results do not rule out bacterial infection or co-infection with other viruses.  The expected result is Negative. Fact Sheet for Patients: SugarRoll.be Fact Sheet for Healthcare Providers: https://www.woods-mathews.com/ This test is not yet approved or cleared by the [place] FDA and  has been authorized for detection and/or diagnosis of SARS-CoV-2 by FDA under an Emergency Use Authorization (EUA). This EUA will remain    in effect (meaning this test can be used) for the  duration of the COVID-19 declaration under Section 564(b)(1) of the Act, 21 U.S.C. section 360bbb-3(b)(1), unless the authorization is terminated or revoked sooner. Performed at [facility], [place] [address], [place],  [address]   CSF culture with Stat gram stain     Status: None   Collection Time: 02/14/20 11:35 PM   Specimen: CSF; Cerebrospinal Fluid  Result Value Ref Range Status   Specimen Description CSF  Final   Special Requests  NONE  Final   Gram Stain   Final    CYTOSPIN SMEAR WBC PRESENT,BOTH PMN AND MONONUCLEAR NO ORGANISMS SEEN    Culture   Final    NO GROWTH 3 DAYS Performed at [facility], [address] [place], [place] [address]    Report Status 02/18/2020 FINAL  Final    Cliffton Asters, MD [facility] for Infectious Disease [facility] [phone] pager   [phone] cell 02/20/2020, 1:02 PM

## 2020-02-20 NOTE — Progress Notes (Addendum)
Pharmacy Antibiotic Note  Tyler Vargas is a 34 y.o. adult admitted on 02/14/2020 with bacteremia.  Pharmacy has been consulted for vancomycin dosing. Patient with recurrent fevers but normal WBC. Now growing 4/4 GPC in clusters. mecA detected suggesting MRSA   Plan: -Vancomycin 1250 mg IV once, then start Vancomycin 1000 mg IV Q 12 hrs. Goal AUC 400-550. Expected AUC: 495 SCr used: 0.98 -Monitor CBC, renal fx and clinical progress -F/u culture results    Height: 5\' 8"  (172.7 cm) Weight: 65.1 kg (143 lb 8.3 oz) IBW/kg (Calculated) : 68.4  Temp (24hrs), Avg:98.7 F (37.1 C), Min:97.3 F (36.3 C), Max:100.5 F (38.1 C)  Recent Labs  Lab 02/14/20 1905 02/14/20 1911 02/14/20 2146 02/15/20 0052 02/15/20 0052 02/16/20 0828 02/17/20 0313 02/18/20 0336 02/19/20 0450 02/19/20 1431 02/20/20 0541  WBC 11.4*  --   --   --   --   --   --   --  8.2 12.7* 9.0  CREATININE 0.86   < >  --  0.88   < > 0.71 0.78 0.75 0.82  --  0.98  LATICACIDVEN  --   --  1.7 1.0  --   --   --   --   --   --   --    < > = values in this interval not displayed.    Estimated Creatinine Clearance (by C-G formula based on SCr of 0.98 mg/dL) Male: 04/21/20 mL/min Male: 98.7 mL/min    No Known Allergies    Microbiology results: 4/1 BCx: 4/4 GPC in clusters    Thank you for allowing pharmacy to be a part of this patient's care.  6/1, PharmD., BCPS Clinical Pharmacist Clinical phone for 02/20/20 until 11pm: [phone] If after 11pm, please refer to [facility] for unit-specific pharmacist

## 2020-02-20 NOTE — Progress Notes (Signed)
SLP Cancellation Note  Patient Details Name: Jeziah Kretschmer MRN: [id number] DOB: Sep 17, 1986   Cancelled treatment:       Reason Eval/Treat Not Completed: Other (comment)(Spoke to MD regarding pt, pt continues with fevers, will follow up next week wiht pt - MD agreeable to plan.)  Rolena Infante, MS [facility] SLP Acute Rehab Services Office [phone]  Chales Abrahams 02/20/2020, 10:36 AM

## 2020-02-21 DIAGNOSIS — B957 Other staphylococcus as the cause of diseases classified elsewhere: Secondary | ICD-10-CM | POA: Diagnosis not present

## 2020-02-21 DIAGNOSIS — R7881 Bacteremia: Secondary | ICD-10-CM | POA: Diagnosis not present

## 2020-02-21 LAB — COMPREHENSIVE METABOLIC PANEL
ALT: 31 U/L (ref 0–44)
AST: 55 U/L — ABNORMAL HIGH (ref 15–41)
Albumin: 2.2 g/dL — ABNORMAL LOW (ref 3.5–5.0)
Alkaline Phosphatase: 71 U/L (ref 38–126)
Anion gap: 12 (ref 5–15)
BUN: 19 mg/dL (ref 6–20)
CO2: 22 mmol/L (ref 22–32)
Calcium: 8.8 mg/dL — ABNORMAL LOW (ref 8.9–10.3)
Chloride: 101 mmol/L (ref 98–111)
Creatinine, Ser: 0.9 mg/dL (ref 0.61–1.24)
GFR calc Af Amer: >60 mL/min (ref >60)
GFR calc non Af Amer: >60 mL/min (ref >60)
Glucose, Bld: 107 mg/dL — ABNORMAL HIGH (ref 70–99)
Potassium: 4.2 mmol/L (ref 3.5–5.1)
Sodium: 135 mmol/L (ref 135–145)
Total Bilirubin: 0.3 mg/dL (ref 0.3–1.2)
Total Protein: 8.4 g/dL — ABNORMAL HIGH (ref 6.5–8.1)

## 2020-02-21 LAB — GLUCOSE, CAPILLARY
Glucose-Capillary: 116 mg/dL — ABNORMAL HIGH (ref 70–99)
Glucose-Capillary: 123 mg/dL — ABNORMAL HIGH (ref 70–99)
Glucose-Capillary: 96 mg/dL (ref 70–99)
Glucose-Capillary: 102 mg/dL — ABNORMAL HIGH (ref 70–99)
Glucose-Capillary: 93 mg/dL (ref 70–99)
Glucose-Capillary: 125 mg/dL — ABNORMAL HIGH (ref 70–99)

## 2020-02-21 LAB — CBC
HCT: 36.2 % — ABNORMAL LOW (ref 39.0–52.0)
Hemoglobin: 11.7 g/dL — ABNORMAL LOW (ref 13.0–17.0)
MCH: 29.3 pg (ref 26.0–34.0)
MCHC: 32.3 g/dL (ref 30.0–36.0)
MCV: 90.7 fL (ref 80.0–100.0)
Platelets: 581 10*3/uL — ABNORMAL HIGH (ref 150–400)
RBC: 3.99 MIL/uL — ABNORMAL LOW (ref 4.22–5.81)
RDW: 15.6 % — ABNORMAL HIGH (ref 11.5–15.5)
WBC: 4.4 10*3/uL (ref 4.0–10.5)
nRBC: 0 % (ref 0.0–0.2)

## 2020-02-21 LAB — FERRITIN: Ferritin: 843 ng/mL — ABNORMAL HIGH (ref 24–336)

## 2020-02-21 LAB — C-REACTIVE PROTEIN: CRP: 2 mg/dL — ABNORMAL HIGH (ref <1.0)

## 2020-02-21 NOTE — Progress Notes (Signed)
PROGRESS NOTE                                                                                                                                                                                                             Patient Demographics:    Alyssa Mancera, is a 34 y.o. adult, DOB - Apr 09, 1986, UXN:[id number]  Outpatient Primary MD for the patient is Patient, No Pcp Per   Admit date - 02/14/2020   LOS - 7  Chief Complaint  Patient presents with  . Code Stroke       Brief Narrative: Patient is a 34 y.o. 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  Hospital course complicated by persistent encephalopathy, COVID-19 pneumonia, and MRSA bacteremia.  See below for further details.  Significant Events: 3/27>> admit to [facility] for acute CVA. 3/28>> EEG negative for seizures 3/31>>cortak tube inserted 4/1>> Recurrent fever>>Blood cultures-Prelim MRSA 4/1>> EEG negative for seizures 4/3>>d/w patients father-DNR  COVID-19 medications: Steroids: 3/28>>4/2 Remdesivir: 3/28>>4/1  Antibiotics: Vancomycin 4/3>> Penicillin G: 3/27>> 3/28  Microbiology data: 4/1>>blood culture:MRSA 3/27>> Blood culture: negative 3/27>> CSF culture: Negative 3/27>> urine culture: Multiple morphotypes 3/27>> VDRL CSF: Nonreactive 3/27>> CSF cryptococcal antigen negative  DVT prophylaxis: SQ Lovenox  Procedures: 3/27 >>lumbar puncture: By ED MD  Consults: ID Neuro Palliative care    Subjective:   Remains very encephalopathic-barely opens eyes-really not moving with commands.  Does withdraw lower extremities to pain.   Assessment  & Plan :   Acute CVA: Remains aphasic-exam is difficult because of encephalopathy.  He has obvious left-sided weakness-but is not moving his right side as well-but picture is clouded by encephalopathy.   Repeat CT head on 4/1 without any new findings.  Spot EEG on 4/1 without obvious seizure-like activity.  Extensive stroke work-up/neurology evaluation completed-CTA neck without any significant extracranial stenosis, echo without any obvious embolic source, transcranial Doppler negative for PFO.  Given ongoing encephalopathy-repeat CT head was negative for acute abnormalities.  This MD spoke with Dr. 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  No improvement in encephalopathy-suspect will require a PEG tube.  However has very  poor overall prognosis-before proceeding with PEG tube placement-have consulted palliative care for goals of care discussion.  Covid 19 Viral pneumonia: Remains stable on room air-has completed a course of steroids/remdesivir.  Fever: afebrile  O2 requirements:  SpO2: 97 %   COVID-19 Labs: Recent Labs    02/19/20 0450 02/20/20 0541 02/21/20 0343  DDIMER 2.94* 2.55*  --   FERRITIN 713* 745* 843*  CRP 2.0* 4.5* 2.0*    No results found for: BNP  No results for input(s): PROCALCITON in the last 168 hours.  Lab Results  Component Value Date   SARSCOV2NAA POSITIVE (A) 02/14/2020    ?  Aseptic meningitis: CSF with elevated protein levels-and slight leukocytosis-mostly lymphocytes.  CSF cultures negative-VDRL CSF negative-CSF cryptococcal antigen negative.  Now with fever-more encephalopathic-repeating CT head and checking spot EEG.  MRSA bacteremia: Had persistent fever on 4/1-blood cultures done on 4/1-prelim result positive for MRSA.  Started on IV vancomycin this morning.  We will repeat blood cultures tomorrow.  Recent echo on 3/28 without any vegetations.  Given his tenuous clinical situation-overall poor prognosis-prior repeat cultures-very unlikely he has  endocarditis.  Not in any shape to tolerate a TEE at this point in any event.  Acute metabolic encephalopathy: Remains encephalopathic-not following commands.Repeat CT head on 4/1 without any acute findings.  EEG on 4/1 without seizure-like activity.  Suspect etiology related to encephalitis/aseptic meningitis related to HIV.  Note CSF VDRL and CSF cryptococcal antigen negative.  Continue supportive care.    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  History of syphilis: CSF VDRL negative-not likely to have neurosyphilis as the cause of CVA.  No longer on penicillin G.   Cocaine use: We will counsel when more awake and alert  Right upper extremity swelling: Likely secondary to infiltrated IV-Doppler ultrasound on 3/31 neg for DVT.  Goals of care: Long discussion with father again on 4/3-patient is not married-does not have any children-father was already aware of patient's HIV diagnoses to patient's sister who learned about the diagnosis from patient's roommate.  He is aware of the patient's overall tenuous condition-and underlying serious medical issues as outlined above-he is really appreciative of our efforts to take care of his son.  He understands the critical nature of the situation-and that there is potential for further decline resulting in death during this hospital stay.  He also understands that if the patient does survive-patient may require PEG tube-and may have significant long-term neurological sequelae from his encephalitis/CVA.  Per patient's father-he does not want his son to suffer-and agrees that if patient were to sustain a cardiac arrest-he would not want for son to be resuscitated.  I have placed a DNR order-I have spoken with the palliative care MD on call-they will arrange for family meeting-and delineate further goals of care regarding PEG tube, disposition issues etc.  Nutrition Problem: Nutrition Problem: Increased  nutrient needs Etiology: chronic illness(HIV/AIDS) Signs/Symptoms: estimated needs Interventions: Tube feeding, Prostat  ABG:    Component Value Date/Time   PHART 7.254 (L) 03/07/2014 1019   PCO2ART 40.7 03/07/2014 1019   PO2ART 107.0 (H) 03/07/2014 1019   HCO3 18.0 (L) 03/07/2014 1019   TCO2 27 02/14/2020 1911   ACIDBASEDEF 9.0 (H) 03/07/2014 1019   O2SAT 97.0 03/07/2014 1019    Vent Settings: N/A  Condition -extremely guarded  Family Communication  : Spoke at length with father-on 4/3-see above  Code Status :  Full Code  Diet :  Diet Order            Diet NPO time specified  Diet effective now               Disposition Plan  :  Remain hospitalized  Barriers to discharge: CVA-aphasic-dysphagia-ongoing encephalopathy-fever  Antimicorbials  :    Anti-infectives (From admission, onward)   Start     Dose/Rate Route Frequency Ordered Stop   02/21/20 0900  vancomycin (VANCOCIN) IVPB 1000 mg/200 mL premix     1,000 mg 200 mL/hr over 60 Minutes Intravenous Every 12 hours 02/20/20 2000     02/20/20 2030  vancomycin (VANCOREADY) IVPB 1250 mg/250 mL     1,250 mg 166.7 mL/hr over 90 Minutes Intravenous  Once 02/20/20 1956 02/20/20 2156   02/19/20 1400  abacavir-dolutegravir-lamiVUDine (TRIUMEQ) 600-50-300 MG per tablet 1 tablet     1 tablet Oral Daily 02/19/20 1358     02/19/20 1000  sulfamethoxazole-trimethoprim (BACTRIM) 200-40 MG/5ML suspension 20 mL     20 mL Per Tube Daily 02/19/20 0901     02/17/20 0000  bictegravir-emtricitabine-tenofovir AF (BIKTARVY) 50-200-25 MG TABS tablet     1 tablet Oral Daily 02/17/20 1250 03/18/20 2359   02/16/20 1000  remdesivir 100 mg in sodium chloride 0.9 % 100 mL IVPB     100 mg 200 mL/hr over 30 Minutes Intravenous Daily 02/15/20 1016 02/19/20 0858   02/15/20 1100  remdesivir 200 mg in sodium chloride 0.9% 250 mL IVPB     200 mg 580 mL/hr over 30 Minutes Intravenous Once 02/15/20 1016 02/15/20 1150   02/15/20 1000   bictegravir-emtricitabine-tenofovir AF (BIKTARVY) 50-200-25 MG per tablet 1 tablet  Status:  Discontinued    Note to Pharmacy: Try to take at the same time each day with or without food.     1 tablet Oral Daily 02/14/20 2231 02/19/20 1358   02/15/20 1000  sulfamethoxazole-trimethoprim (BACTRIM DS) 800-160 MG per tablet 1 tablet  Status:  Discontinued     1 tablet Oral Daily 02/14/20 2231 02/19/20 0901   02/15/20 0530  vancomycin (VANCOCIN) IVPB 1000 mg/200 mL premix  Status:  Discontinued     1,000 mg 200 mL/hr over 60 Minutes Intravenous Every 8 hours 02/14/20 2120 02/15/20 0957   02/14/20 2215  penicillin G potassium 4 Million Units in dextrose 5 % 250 mL IVPB  Status:  Discontinued     4 Million Units 250 mL/hr over 60 Minutes Intravenous Every 4 hours 02/14/20 2202 02/15/20 1653   02/14/20 2200  ceFEPIme (MAXIPIME) 2 g in sodium chloride 0.9 % 100 mL IVPB  Status:  Discontinued     2 g 200 mL/hr over 30 Minutes Intravenous Every 8 hours 02/14/20 2120 02/14/20 2145   02/14/20 2130  vancomycin (VANCOREADY) IVPB 1500 mg/300 mL     1,500 mg 150 mL/hr over 120 Minutes Intravenous  Once 02/14/20 2120 02/15/20 0044   02/14/20 2115  ceFEPIme (MAXIPIME) 2 g in sodium chloride 0.9 % 100 mL IVPB  Status:  Discontinued     2 g 200 mL/hr over 30 Minutes Intravenous  Once 02/14/20 2103 02/14/20 2120   02/14/20 2115  metroNIDAZOLE (FLAGYL) IVPB 500 mg  Status:  Discontinued     500 mg 100 mL/hr over 60 Minutes Intravenous  Once 02/14/20 2103 02/14/20 2120   02/14/20 2115  vancomycin (VANCOCIN) IVPB 1000 mg/200 mL premix  Status:  Discontinued     1,000 mg 200 mL/hr over 60 Minutes Intravenous  Once 02/14/20 2103 02/14/20  2120      Inpatient Medications  Scheduled Meds: .  stroke: mapping our early stages of recovery book   Does not apply Once  . abacavir-dolutegravir-lamiVUDine  1 tablet Oral Daily  . aspirin  325 mg Per NG tube Daily  . enoxaparin (LOVENOX) injection  40 mg Subcutaneous Q24H   . feeding supplement (PRO-STAT SUGAR FREE 64)  30 mL Per Tube BID  . influenza vac split quadrivalent PF  0.5 mL Intramuscular Tomorrow-1000  . pneumococcal 23 valent vaccine  0.5 mL Intramuscular Tomorrow-1000  . sulfamethoxazole-trimethoprim  20 mL Per Tube Daily   Continuous Infusions: . dextrose 5% lactated ringers 10 mL/hr at 02/21/20 0400  . feeding supplement (OSMOLITE 1.5 CAL) 55 mL/hr at 02/21/20 0400  . vancomycin 1,000 mg (02/21/20 0904)   PRN Meds:.acetaminophen **OR** acetaminophen (TYLENOL) oral liquid 160 mg/5 mL **OR** acetaminophen, senna-docusate   Time Spent in minutes  45  See all Orders from today for further details   Jeoffrey Massed M.D on 02/21/2020 at 11:33 AM  To page go to [email or web address] - use universal password  [facility] -  Office  [phone]    Objective:   Vitals:   02/21/20 0500 02/21/20 0800 02/21/20 0900 02/21/20 1000  BP:  96/67 95/61 108/67  Pulse:  82 79 85  Resp:  18 17 (!) 21  Temp:  98.4 F (36.9 C)    TempSrc:  Oral    SpO2:  96% 97% 97%  Weight: 64.8 kg     Height:        Wt Readings from Last 3 Encounters:  02/21/20 64.8 kg  01/28/20 69.9 kg  10/30/19 57.2 kg     Intake/Output Summary (Last 24 hours) at 02/21/2020 1133 Last data filed at 02/21/2020 0600 Gross per 24 hour  Intake 1504.54 ml  Output 1750 ml  Net -245.46 ml     Physical Exam Gen Exam: Barely opens eyes-very encephalopathic.  Does not follow commands.  Withdraws to pain-by moving both lower extremities. HEENT:atraumatic, normocephalic Chest: B/L clear to auscultation anteriorly CVS:S1S2 regular Abdomen:soft non tender, non distended Extremities:no edema Neurology: Has obvious right facial droop-but very difficult neurological exam given severe encephalopathy Skin: no rash   Data Review:    CBC Recent Labs  Lab 02/14/20 1905 02/14/20 1905 02/14/20 1911 02/19/20 0450 02/19/20 1431 02/20/20 0541 02/21/20 0343  WBC 11.4*  --   --   8.2 12.7* 9.0 4.4  HGB 10.6*   < > 11.2* 11.1* 11.3* 11.3* 11.7*  HCT 35.0*   < > 33.0* 34.6* 35.2* 35.8* 36.2*  PLT 561*  --   --  690* 710* 684* 581*  MCV 95.4  --   --  90.8 91.7 92.7 90.7  MCH 28.9  --   --  29.1 29.4 29.3 29.3  MCHC 30.3  --   --  32.1 32.1 31.6 32.3  RDW 15.7*  --   --  15.2 15.6* 15.7* 15.6*  LYMPHSABS 0.8  --   --   --  0.7  --   --   MONOABS 0.5  --   --   --  0.2  --   --   EOSABS 0.0  --   --   --  0.0  --   --   BASOSABS 0.1  --   --   --  0.0  --   --    < > = values in this interval not displayed.    Chemistries  Recent  Labs  Lab 02/17/20 0313 02/18/20 0336 02/19/20 0450 02/20/20 0541 02/21/20 0343  NA 136 136 136 135 135  K 4.5 3.8 3.6 3.9 4.2  CL 99 103 101 100 101  CO2 23 22 23 26 22   GLUCOSE 102* 371* 136* 106* 107*  BUN 10 12 13 16 19   CREATININE 0.78 0.75 0.82 0.98 0.90  CALCIUM 9.0 8.6* 8.4* 8.5* 8.8*  AST 22 43* 60* 81* 55*  ALT 11 18 29  32 31  ALKPHOS 48 41 57 72 71  BILITOT 0.3 <0.1* 0.3 0.5 0.3   ------------------------------------------------------------------------------------------------------------------ No results for input(s): CHOL, HDL, LDLCALC, TRIG, CHOLHDL, LDLDIRECT in the last 72 hours.  Lab Results  Component Value Date   HGBA1C 5.9 (H) 02/15/2020   ------------------------------------------------------------------------------------------------------------------ No results for input(s): TSH, T4TOTAL, T3FREE, THYROIDAB in the last 72 hours.  Invalid input(s): FREET3 ------------------------------------------------------------------------------------------------------------------ Recent Labs    02/20/20 0541 02/21/20 0343  FERRITIN 745* 843*    Coagulation profile Recent Labs  Lab 02/14/20 1905  INR 1.1    Recent Labs    02/19/20 0450 02/20/20 0541  DDIMER 2.94* 2.55*    Cardiac Enzymes No results for input(s): CKMB, TROPONINI, MYOGLOBIN in the last 168 hours.  Invalid input(s):  CK ------------------------------------------------------------------------------------------------------------------ No results found for: BNP  Micro Results Recent Results (from the past 240 hour(s))  Culture, blood (Routine X 2) w Reflex to ID Panel     Status: None   Collection Time: 02/14/20  9:40 PM   Specimen: BLOOD  Result Value Ref Range Status   Specimen Description BLOOD RIGHT ARM  Final   Special Requests   Final    BOTTLES DRAWN AEROBIC AND ANAEROBIC Blood Culture adequate volume   Culture   Final    NO GROWTH 5 DAYS Performed at [facility], [address], [place], [place] [address]    Report Status 02/19/2020 FINAL  Final  Culture, blood (Routine X 2) w Reflex to ID Panel     Status: None   Collection Time: 02/14/20  9:46 PM   Specimen: BLOOD  Result Value Ref Range Status   Specimen Description BLOOD RIGHT FOREARM  Final   Special Requests   Final    BOTTLES DRAWN AEROBIC AND ANAEROBIC Blood Culture adequate volume   Culture   Final    NO GROWTH 5 DAYS Performed at [facility], [address] [place], [place] [address]    Report Status 02/19/2020 FINAL  Final  Urine culture     Status: Abnormal   Collection Time: 02/14/20  9:46 PM   Specimen: In/Out Cath Urine  Result Value Ref Range Status   Specimen Description IN/OUT CATH URINE  Final   Special Requests   Final    NONE Performed at [facility], [address], [place], [place] [address]    Culture MULTIPLE SPECIES PRESENT, SUGGEST RECOLLECTION (A)  Final   Report Status 02/16/2020 FINAL  Final  SARS CORONAVIRUS 2 (TAT 6-24 HRS)     Status: Abnormal   Collection Time: 02/14/20 10:55 PM  Result Value Ref Range Status   SARS Coronavirus 2 POSITIVE (A) NEGATIVE Final    Comment: RESULT CALLED TO, READ BACK BY AND VERIFIED WITH: K. GUY,RN 0335 02/15/2020 T. TYSOR (NOTE) SARS-CoV-2 target nucleic acids are DETECTED. The SARS-CoV-2 RNA is generally detectable in upper and  lower respiratory specimens during the acute phase of infection. Positive results are indicative of the presence of SARS-CoV-2 RNA. Clinical correlation  with patient history and other diagnostic information is  necessary to determine patient infection status. Positive results do not rule out bacterial infection or co-infection with other viruses.  The expected result is Negative. Fact Sheet for Patients: HairSlick.no Fact Sheet for Healthcare Providers: quierodirigir.com This test is not yet approved or cleared by the [place] FDA and  has been authorized for detection and/or diagnosis of SARS-CoV-2 by FDA under an Emergency Use Authorization (EUA). This EUA will remain  in effect (meaning this test can be used) for the  duration of the COVID-19 declaration under Section 564(b)(1) of the Act, 21 U.S.C. section 360bbb-3(b)(1), unless the authorization is terminated or revoked sooner. Performed at [facility], [address], [place], [place] [address]   CSF culture with Stat gram stain     Status: None   Collection Time: 02/14/20 11:35 PM   Specimen: CSF; Cerebrospinal Fluid  Result Value Ref Range Status   Specimen Description CSF  Final   Special Requests NONE  Final   Gram Stain   Final    CYTOSPIN SMEAR WBC PRESENT,BOTH PMN AND MONONUCLEAR NO ORGANISMS SEEN    Culture   Final    NO GROWTH 3 DAYS Performed at [facility], [address], [place], [place] [address]    Report Status 02/18/2020 FINAL  Final  Culture, blood (routine x 2)     Status: Abnormal (Preliminary result)   Collection Time: 02/19/20  7:39 AM   Specimen: BLOOD  Result Value Ref Range Status   Specimen Description BLOOD RIGHT ANTECUBITAL  Final   Special Requests   Final    BOTTLES DRAWN AEROBIC AND ANAEROBIC Blood Culture adequate volume   Culture  Setup Time   Final    GRAM POSITIVE COCCI IN CLUSTERS IN BOTH AEROBIC AND  ANAEROBIC BOTTLES Organism ID to follow CRITICAL RESULT CALLED TO, READ BACK BY AND VERIFIED WITHColin Rhein PHARMD 2148 02/20/20 A BROWNING Performed at [facility], [address], [place], [place] [address]    Culture STAPHYLOCOCCUS SPECIES (COAGULASE NEGATIVE) (A)  Final   Report Status PENDING  Incomplete  Blood Culture ID Panel (Reflexed)     Status: Abnormal   Collection Time: 02/19/20  7:39 AM  Result Value Ref Range Status   Enterococcus species NOT DETECTED NOT DETECTED Final   Listeria monocytogenes NOT DETECTED NOT DETECTED Final   Staphylococcus species DETECTED (A) NOT DETECTED Final    Comment: Methicillin (oxacillin) resistant coagulase negative staphylococcus. Possible blood culture contaminant (unless isolated from more than one blood culture draw or clinical case suggests pathogenicity). No antibiotic treatment is indicated for blood  culture contaminants. CRITICAL RESULT CALLED TO, READ BACK BY AND VERIFIED WITH: B MANCHERIL PHARMD 2148 02/20/20 A BROWNING    Staphylococcus aureus (BCID) NOT DETECTED NOT DETECTED Final   Methicillin resistance DETECTED (A) NOT DETECTED Final    Comment: CRITICAL RESULT CALLED TO, READ BACK BY AND VERIFIED WITH: B MANCHERIL PHARMD 2148 02/20/20 A BROWNING    Streptococcus species NOT DETECTED NOT DETECTED Final   Streptococcus agalactiae NOT DETECTED NOT DETECTED Final   Streptococcus pneumoniae NOT DETECTED NOT DETECTED Final   Streptococcus pyogenes NOT DETECTED NOT DETECTED Final   Acinetobacter baumannii NOT DETECTED NOT DETECTED Final   Enterobacteriaceae species NOT DETECTED NOT DETECTED Final   Enterobacter cloacae complex NOT DETECTED NOT DETECTED Final   Escherichia coli NOT DETECTED NOT DETECTED Final   Klebsiella oxytoca NOT DETECTED NOT DETECTED Final  Klebsiella pneumoniae NOT DETECTED NOT DETECTED Final   Proteus species NOT DETECTED NOT DETECTED Final   Serratia marcescens NOT DETECTED NOT DETECTED Final    Haemophilus influenzae NOT DETECTED NOT DETECTED Final   Neisseria meningitidis NOT DETECTED NOT DETECTED Final   Pseudomonas aeruginosa NOT DETECTED NOT DETECTED Final   Candida albicans NOT DETECTED NOT DETECTED Final   Candida glabrata NOT DETECTED NOT DETECTED Final   Candida krusei NOT DETECTED NOT DETECTED Final   Candida parapsilosis NOT DETECTED NOT DETECTED Final   Candida tropicalis NOT DETECTED NOT DETECTED Final    Comment: Performed at [facility], [address] [place], [place] [address]  Culture, blood (routine x 2)     Status: Abnormal (Preliminary result)   Collection Time: 02/19/20  7:47 AM   Specimen: BLOOD  Result Value Ref Range Status   Specimen Description BLOOD RIGHT ANTECUBITAL  Final   Special Requests   Final    BOTTLES DRAWN AEROBIC AND ANAEROBIC Blood Culture adequate volume   Culture  Setup Time   Final    GRAM POSITIVE COCCI IN CLUSTERS IN BOTH AEROBIC AND ANAEROBIC BOTTLES CRITICAL RESULT CALLED TO, READ BACK BY AND VERIFIED WITH: Janice Norrie, PHARMD AT 1908 ON 02/20/20 BY C. JESSUP, MT Performed at [facility], [address] [place], [place] [address]    Culture STAPHYLOCOCCUS SPECIES (COAGULASE NEGATIVE) (A)  Final   Report Status PENDING  Incomplete    Radiology Reports EEG  Result Date: 02/15/2020 Charlsie Quest, MD     02/15/2020  4:13 PM Patient Name: Krystle Oberman MRN: [id number] Epilepsy Attending: Charlsie Quest Referring Physician/Provider: Dr Georgiana Spinner Aroor Date: 02/15/2020 Duration: 23.26 mins Patient history: 33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 EEG to evaluate for seizure Level of alertness: asleep AEDs during EEG study: None Technical aspects: This EEG study was done  with scalp electrodes positioned according to the 10-20 International system of electrode placement. Electrical activity was acquired at a sampling rate of  and reviewed with a high frequency filter of  and a low frequency filter of . EEG data were recorded continuously and digitally stored. DESCRIPTION:  Sleep was characterized by vertex waves, sleep spindles, maximal frontocentral. Hyperventilation and photic stimulation were not performed. IMPRESSION: This study during sleep only is within normal limits. No seizures or epileptiform discharges were seen throughout the recording. If suspicion for interictal activity remains a concern, a prolonged study can be considered. Priyanka Annabelle Harman   CT ANGIO HEAD W OR WO CONTRAST  Result Date: 02/14/2020 CLINICAL DATA:  Stroke. Right-sided weakness and speech disturbance. EXAM: CT ANGIOGRAPHY HEAD AND NECK CT PERFUSION BRAIN TECHNIQUE: Multidetector CT imaging of the head and neck was performed using the standard protocol during bolus administration of intravenous contrast. Multiplanar CT image reconstructions and MIPs were obtained to evaluate the vascular anatomy. Carotid stenosis measurements (when applicable) are obtained utilizing NASCET criteria, using the distal internal carotid diameter as the denominator. Multiphase CT imaging of the brain was performed following IV bolus contrast injection. Subsequent parametric perfusion maps were calculated using RAPID software. CONTRAST:  40mL OMNIPAQUE IOHEXOL 350 MG/ML SOLN; 75mL OMNIPAQUE IOHEXOL 350 MG/ML SOLN COMPARISON:  None. FINDINGS: CTA NECK FINDINGS Aortic arch: Standard 3 vessel aortic arch  with widely patent arch vessel origins. Right carotid system: Patent and smooth without evidence of stenosis or dissection. Left carotid system: Patent and smooth without evidence of stenosis or dissection. Vertebral arteries: Patent, smooth, and codominant without evidence of stenosis or dissection. Skeleton: Poor  dentition with multiple periapical erosions. Other neck: Borderline enlarged level II lymph nodes bilaterally. Upper chest: Centrilobular emphysema. Small superior mediastinal lymph nodes. Review of the MIP images confirms the above findings CTA HEAD FINDINGS Anterior circulation: The internal carotid arteries are widely patent from skull base to carotid termini. The A1 and M1 segments are widely patent with the left A1 segment being mildly hypoplastic. There is a decreased number of branch vessels in the right MCA superior division with suggestion of occlusion and intermittent reconstitution of distal M2 and M3 branches. There is also multifocal irregular narrowing of M3 branches in the right MCA inferior division, and there is mild left MCA branch vessel irregularity. No aneurysm is identified. Posterior circulation: The intracranial vertebral arteries are widely patent to the basilar. The basilar artery is widely patent. There are small posterior communicating arteries bilaterally. PCAs are patent with mild branch vessel irregularity more notable on the right without evidence of flow limiting proximal stenosis. No aneurysm is identified. Venous sinuses: Patent. Anatomic variants: None. Review of the MIP images confirms the above findings CT Brain Perfusion Findings: ASPECTS: 4 CBF (<30%) Volume: 0mL Perfusion (Tmax>6.0s) volume: 12mL Mismatch Volume: 12mL, however the core infarct on noncontrast head CT is not being picked up by the automated processing and the true penumbra is less than 12 mL Infarction Location: Right MCA IMPRESSION: 1. Multiple missing right MCA branch vessels in the superior division with wide patency of the M1 segment and no discrete proximal M2 occlusion identified. 2. Widely patent common carotid, internal carotid, and vertebral arteries. 3. CT perfusion demonstrating at most a small penumbra as detailed above. These results were called by telephone at the time of interpretation on  02/14/2020 at 7:55 p.m. to Dr. Laurence SlateAroor, who verbally acknowledged these results. Electronically Signed   By: Sebastian AcheAllen  Grady M.D.   On: 02/14/2020 20:22   CT HEAD WO CONTRAST  Result Date: 02/19/2020 CLINICAL DATA:  Stroke, follow-up. EXAM: CT HEAD WITHOUT CONTRAST TECHNIQUE: Contiguous axial images were obtained from the base of the skull through the vertex without intravenous contrast. COMPARISON:  Head CT 02/07/2020, brain MRI 02/15/2020 FINDINGS: Brain: Again demonstrated are, now subacute, infarcts within the right basal ganglia, right internal capsule, right frontal operculum, right insula and subinsular region as well as left basal/internal capsule. These infarcts have not significant changed in extent as compared to prior head CT 02/15/2020. No significant mass effect. No midline shift. No evidence of hemorrhagic conversion. No new demarcated infarct is identified. No evidence of intracranial mass. No extra-axial fluid collection. Stable, mild generalized parenchymal atrophy. Vascular: Redemonstrated hyperdense right MCA branch within the right sylvian fissure. Skull: Normal. Negative for fracture or focal lesion. Sinuses/Orbits: Visualized orbits demonstrate no acute abnormality. Mild ethmoid sinus mucosal thickening. No significant mastoid effusion at the imaged levels IMPRESSION: Subacute infarcts within the right MCA territory as well as left basal ganglia/internal capsule, unchanged in extent as compared to head CT 02/15/2020. No significant mass effect. No hemorrhagic conversion. No interval infarct is identified. Stable, mild generalized parenchymal atrophy. Mild ethmoid sinus mucosal thickening. Electronically Signed   By: Jackey LogeKyle  Golden DO   On: 02/19/2020 10:33   CT HEAD WO CONTRAST  Result Date: 02/15/2020 CLINICAL DATA:  Stroke  follow-up. COVID-19 infection. History of HIV. EXAM: CT HEAD WITHOUT CONTRAST TECHNIQUE: Contiguous axial images were obtained from the base of the skull through the vertex  without intravenous contrast. COMPARISON:  Head MRI 02/15/2020 FINDINGS: Brain: Acute right MCA infarcts involving the basal ganglia, internal capsule, insula, and frontal operculum are unchanged as are small acute left basal ganglia and left internal capsule infarcts. A subcentimeter focus of hemorrhage in the right frontal operculum is unchanged. No new infarct, new intracranial hemorrhage, midline shift, or extra-axial fluid collection is identified. Mild ventricular prominence for age is unchanged and likely reflective of mild cerebral atrophy. Vascular: Hyperdense right MCA branch vessels in the sylvian fissure. Skull: No fracture or suspicious osseous lesion. Sinuses/Orbits: Mild right sphenoid sinus mucosal thickening. Small right maxillary sinus mucous retention cyst. Clear mastoid air cells. Unremarkable orbits. Other: None. IMPRESSION: 1. Unchanged acute right MCA infarcts with unchanged small hemorrhage in the right frontal operculum. 2. Unchanged small acute left basal ganglia and internal capsule infarcts. 3. No new intracranial abnormality. Electronically Signed   By: Sebastian Ache M.D.   On: 02/15/2020 19:40   CT ANGIO NECK W OR WO CONTRAST  Result Date: 02/14/2020 CLINICAL DATA:  Stroke. Right-sided weakness and speech disturbance. EXAM: CT ANGIOGRAPHY HEAD AND NECK CT PERFUSION BRAIN TECHNIQUE: Multidetector CT imaging of the head and neck was performed using the standard protocol during bolus administration of intravenous contrast. Multiplanar CT image reconstructions and MIPs were obtained to evaluate the vascular anatomy. Carotid stenosis measurements (when applicable) are obtained utilizing NASCET criteria, using the distal internal carotid diameter as the denominator. Multiphase CT imaging of the brain was performed following IV bolus contrast injection. Subsequent parametric perfusion maps were calculated using RAPID software. CONTRAST:  40mL OMNIPAQUE IOHEXOL 350 MG/ML SOLN; 75mL OMNIPAQUE  IOHEXOL 350 MG/ML SOLN COMPARISON:  None. FINDINGS: CTA NECK FINDINGS Aortic arch: Standard 3 vessel aortic arch with widely patent arch vessel origins. Right carotid system: Patent and smooth without evidence of stenosis or dissection. Left carotid system: Patent and smooth without evidence of stenosis or dissection. Vertebral arteries: Patent, smooth, and codominant without evidence of stenosis or dissection. Skeleton: Poor dentition with multiple periapical erosions. Other neck: Borderline enlarged level II lymph nodes bilaterally. Upper chest: Centrilobular emphysema. Small superior mediastinal lymph nodes. Review of the MIP images confirms the above findings CTA HEAD FINDINGS Anterior circulation: The internal carotid arteries are widely patent from skull base to carotid termini. The A1 and M1 segments are widely patent with the left A1 segment being mildly hypoplastic. There is a decreased number of branch vessels in the right MCA superior division with suggestion of occlusion and intermittent reconstitution of distal M2 and M3 branches. There is also multifocal irregular narrowing of M3 branches in the right MCA inferior division, and there is mild left MCA branch vessel irregularity. No aneurysm is identified. Posterior circulation: The intracranial vertebral arteries are widely patent to the basilar. The basilar artery is widely patent. There are small posterior communicating arteries bilaterally. PCAs are patent with mild branch vessel irregularity more notable on the right without evidence of flow limiting proximal stenosis. No aneurysm is identified. Venous sinuses: Patent. Anatomic variants: None. Review of the MIP images confirms the above findings CT Brain Perfusion Findings: ASPECTS: 4 CBF (<30%) Volume: 0mL Perfusion (Tmax>6.0s) volume: 12mL Mismatch Volume: 12mL, however the core infarct on noncontrast head CT is not being picked up by the automated processing and the true penumbra is less than 12  mL Infarction Location:  Right MCA IMPRESSION: 1. Multiple missing right MCA branch vessels in the superior division with wide patency of the M1 segment and no discrete proximal M2 occlusion identified. 2. Widely patent common carotid, internal carotid, and vertebral arteries. 3. CT perfusion demonstrating at most a small penumbra as detailed above. These results were called by telephone at the time of interpretation on 02/14/2020 at 7:55 p.m. to Dr. Lorraine Lax, who verbally acknowledged these results. Electronically Signed   By: Logan Bores M.D.   On: 02/14/2020 20:22   MR BRAIN WO CONTRAST  Result Date: 02/15/2020 CLINICAL DATA:  Stroke.  HIV. EXAM: MRI HEAD WITHOUT CONTRAST TECHNIQUE: Multiplanar, multiecho pulse sequences of the brain and surrounding structures were obtained without intravenous contrast. COMPARISON:  CT head 02/14/2020 FINDINGS: Brain: Acute right MCA infarct involving the right lenticular nucleus as well as the right frontal operculum and right parietal operculum. Small area of acute infarction in the genu internal capsule on the right. Small area of associated hemorrhage in the right frontal operculum and right parietal operculum. Small areas of acute infarct in the left basal ganglia involving the genu internal capsule and posterior external capsule fibers. Ventricle size normal.  No midline shift.  Negative for mass lesion. Vascular: Normal arterial flow voids. Skull and upper cervical spine: No focal skeletal lesion. Sinuses/Orbits: Mild mucosal edema paranasal sinuses. Negative orbit Other: None IMPRESSION: Acute infarct right MCA territory involving the basal ganglia as well as the right frontal and parietal operculum where there are small areas of associated hemorrhage. Small area of acute infarct in the left internal capsule and external capsule. Electronically Signed   By: Franchot Gallo M.D.   On: 02/15/2020 07:27   CT CEREBRAL PERFUSION W CONTRAST  Result Date: 02/14/2020 CLINICAL  DATA:  Stroke. Right-sided weakness and speech disturbance. EXAM: CT ANGIOGRAPHY HEAD AND NECK CT PERFUSION BRAIN TECHNIQUE: Multidetector CT imaging of the head and neck was performed using the standard protocol during bolus administration of intravenous contrast. Multiplanar CT image reconstructions and MIPs were obtained to evaluate the vascular anatomy. Carotid stenosis measurements (when applicable) are obtained utilizing NASCET criteria, using the distal internal carotid diameter as the denominator. Multiphase CT imaging of the brain was performed following IV bolus contrast injection. Subsequent parametric perfusion maps were calculated using RAPID software. CONTRAST:  87mL OMNIPAQUE IOHEXOL 350 MG/ML SOLN; 53mL OMNIPAQUE IOHEXOL 350 MG/ML SOLN COMPARISON:  None. FINDINGS: CTA NECK FINDINGS Aortic arch: Standard 3 vessel aortic arch with widely patent arch vessel origins. Right carotid system: Patent and smooth without evidence of stenosis or dissection. Left carotid system: Patent and smooth without evidence of stenosis or dissection. Vertebral arteries: Patent, smooth, and codominant without evidence of stenosis or dissection. Skeleton: Poor dentition with multiple periapical erosions. Other neck: Borderline enlarged level II lymph nodes bilaterally. Upper chest: Centrilobular emphysema. Small superior mediastinal lymph nodes. Review of the MIP images confirms the above findings CTA HEAD FINDINGS Anterior circulation: The internal carotid arteries are widely patent from skull base to carotid termini. The A1 and M1 segments are widely patent with the left A1 segment being mildly hypoplastic. There is a decreased number of branch vessels in the right MCA superior division with suggestion of occlusion and intermittent reconstitution of distal M2 and M3 branches. There is also multifocal irregular narrowing of M3 branches in the right MCA inferior division, and there is mild left MCA branch vessel irregularity.  No aneurysm is identified. Posterior circulation: The intracranial vertebral arteries are widely patent to the basilar.  The basilar artery is widely patent. There are small posterior communicating arteries bilaterally. PCAs are patent with mild branch vessel irregularity more notable on the right without evidence of flow limiting proximal stenosis. No aneurysm is identified. Venous sinuses: Patent. Anatomic variants: None. Review of the MIP images confirms the above findings CT Brain Perfusion Findings: ASPECTS: 4 CBF (<30%) Volume: 0mL Perfusion (Tmax>6.0s) volume: 12mL Mismatch Volume: 12mL, however the core infarct on noncontrast head CT is not being picked up by the automated processing and the true penumbra is less than 12 mL Infarction Location: Right MCA IMPRESSION: 1. Multiple missing right MCA branch vessels in the superior division with wide patency of the M1 segment and no discrete proximal M2 occlusion identified. 2. Widely patent common carotid, internal carotid, and vertebral arteries. 3. CT perfusion demonstrating at most a small penumbra as detailed above. These results were called by telephone at the time of interpretation on 02/14/2020 at 7:55 p.m. to Dr. Laurence Slate, who verbally acknowledged these results. Electronically Signed   By: Sebastian Ache M.D.   On: 02/14/2020 20:22   DG Chest Port 1 View  Result Date: 02/14/2020 CLINICAL DATA:  Acute CVA. Evaluate for pneumonia. EXAM: PORTABLE CHEST 1 VIEW COMPARISON:  Radiograph 07/07/2019. Lung apices from CT angiography earlier today. FINDINGS: Ill-defined opacity in the right mid lung abutting the minor fissure. Mild cardiomegaly. Normal mediastinal contours. No pulmonary edema, large pleural effusion or pneumothorax. No acute osseous abnormalities are seen. IMPRESSION: Ill-defined opacity in the right mid lung abutting the minor fissure, suspicious for pneumonia. Mild cardiomegaly. Electronically Signed   By: Narda Rutherford M.D.   On: 02/14/2020  21:58   DG Chest Port 1V same Day  Result Date: 02/19/2020 CLINICAL DATA:  COVID-19 positive, shortness of breath EXAM: PORTABLE CHEST 1 VIEW COMPARISON:  02/14/2020 FINDINGS: Interval placement of enteric tube coursing below the diaphragm, distal tip beyond the inferior margin of the film. Cardiomediastinal contours are stable. Subtle opacity within the right mid lung is again seen abutting the minor fissure, slightly improved from prior. No new focal airspace consolidation. No pleural effusion or pneumothorax. IMPRESSION: 1. Interval placement of enteric tube, distal tip beyond the inferior margin of the film. 2. Slight interval improvement in right mid lung opacity. Electronically Signed   By: Duanne Guess D.O.   On: 02/19/2020 08:54   DG Abd Portable 1V  Result Date: 02/15/2020 CLINICAL DATA:  MRI clearance EXAM: PORTABLE ABDOMEN - 1 VIEW COMPARISON:  None. FINDINGS: The bowel gas pattern is normal. No radio-opaque calculi or other significant radiographic abnormality are seen. IMPRESSION: No radial pain metallic foreign body in the abdomen or pelvis. Electronically Signed   By: Elige Ko   On: 02/15/2020 05:57   EEG adult  Result Date: 02/19/2020 Charlsie Quest, MD     02/19/2020  6:27 PM Patient Name: Rowen Wilmer MRN: [id number] Epilepsy Attending: Charlsie Quest Referring Physician/Provider: Dr Jeoffrey Massed Date: 02/19/2020 Duration: 23.23 mins Patient history: 33yo M with acute R MCA stroke as well as L basal ganglia/internal capsue stroke who continues to be encephalopathic. EEG to evaluate for seizure. Level of alertness: awake AEDs during EEG study: None Technical aspects: This EEG study was done with scalp electrodes positioned according to the 10-20 International system of electrode placement. Electrical activity was acquired at a sampling rate of 500Hz  and reviewed with a high frequency filter of 70Hz  and a low frequency filter of 1Hz . EEG data were recorded continuously and  digitally stored. DESCRIPTION:  The posterior dominant rhythm consists of 8 Hz activity of moderate voltage (25-35 uV) seen predominantly in posterior head regions, symmetric and reactive to eye opening and eye closing. EEG also showed intermittent generalized and maximal right frontotemporal region 3-6hz  theta-delta slowing. Hyperventilation and photic stimulation were not performed. ABNORMALITY - Intermittent slow, generalized and maximal right frontotemporal region IMPRESSION: This study is suggestive of cortical dysfunction in right frontotemporal region likely secondary to underlying stroke as well as mild diffuse encephalopathy, non specific to etiology. No seizures or epileptiform discharges were seen throughout the recording. Priyanka O Yadav   VAS [place] TRANSCRANIAL DOPPLER W BUBBLES  Result Date: 02/17/2020  Transcranial Doppler with Bubble Indications: PFO. Comparison Study: No prior studies. Performing Technologist: Chanda Busing RVT  Examination Guidelines: A complete evaluation includes B-mode imaging, spectral Doppler, color Doppler, and power Doppler as needed of all accessible portions of each vessel. Bilateral testing is considered an integral part of a complete examination. Limited examinations for reoccurring indications may be performed as noted.  Summary:  A vascular evaluation was performed. The right P2 was studied. An IV was inserted into the patient's right Brachial. Verbal informed consent was obtained. No HITS. Negative for PFO.  Negative TCD Bubble study .No indication of right to left shunt noted. *See table(s) above for TCD measurements and observations.  Diagnosing physician: Delia Heady MD Electronically signed by Delia Heady MD on 02/17/2020 at 12:36:13 PM.    Final    CT HEAD CODE STROKE WO CONTRAST  Result Date: 02/14/2020 CLINICAL DATA:  Code stroke.  Right-sided weakness. EXAM: CT HEAD WITHOUT CONTRAST TECHNIQUE: Contiguous axial images were obtained from the base of the  skull through the vertex without intravenous contrast. COMPARISON:  03/07/2014 FINDINGS: Brain: There is an acute right basal ganglia infarct involving the caudate nucleus, lentiform nucleus, and a portion of the anterior limb of the right internal capsule. There is a subcentimeter focus of acute hemorrhage superficially in the right frontal operculum with a small amount of surrounding low-density, and there is also loss of gray-white differentiation indicative of an acute infarct in the anterior aspect of the right insula and more anterior aspect of the right frontal lobe. There is a new age indeterminate lacunar infarct in the genu of the left internal capsule. The ventricles are normal in size. There is no midline shift or extra-axial fluid collection. Vascular: Hyperdense right MCA. Skull: No fracture or suspicious osseous lesion. Sinuses/Orbits: Partially visualized small mucous retention cyst in the right maxillary sinus. Clear mastoid air cells. Rightward gaze. Other: None. ASPECTS [facility] Stroke Program Early CT Score) - Ganglionic level infarction (caudate, lentiform nuclei, internal capsule, insula, M1-M3 cortex): 1 - Supraganglionic infarction (M4-M6 cortex): 3 Total score (0-10 with 10 being normal): 4 IMPRESSION: 1. 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 2. ASPECTS is 4. 3. 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 These results were communicated to Dr. Laurence Slate at 7:29 pmon 02/14/2020 by text page via the [facility] messaging system. Electronically Signed   By: Sebastian Ache M.D.   On: 02/14/2020 19:30   VAS [place] UPPER EXTREMITY VENOUS DUPLEX  Result Date: 02/18/2020 UPPER VENOUS STUDY  Indications: Swelling Limitations: Patient positioning. Comparison Study: no prior Performing Technologist: Blanch Media RVS  Examination Guidelines: A complete evaluation includes B-mode imaging,  spectral Doppler, color Doppler, and power Doppler as needed of all accessible portions of each vessel. Bilateral  testing is considered an integral part of a complete examination. Limited examinations for reoccurring indications may be performed as noted.  Right Findings: +----------+------------+---------+-----------+----------+--------------+ RIGHT     CompressiblePhasicitySpontaneousProperties   Summary     +----------+------------+---------+-----------+----------+--------------+ IJV                                                 Not visualized +----------+------------+---------+-----------+----------+--------------+ Subclavian    Full       Yes       Yes                             +----------+------------+---------+-----------+----------+--------------+ Axillary      Full       Yes       Yes                             +----------+------------+---------+-----------+----------+--------------+ Brachial      Full       Yes       Yes                             +----------+------------+---------+-----------+----------+--------------+ Radial        Full                                                 +----------+------------+---------+-----------+----------+--------------+ Ulnar         Full                                                 +----------+------------+---------+-----------+----------+--------------+ Cephalic      Full                                                 +----------+------------+---------+-----------+----------+--------------+ Basilic       Full                                                 +----------+------------+---------+-----------+----------+--------------+  Summary:  Right: No evidence of deep vein thrombosis in the upper extremity. No evidence of superficial vein thrombosis in the upper extremity.  *See table(s) above for measurements and observations.  Diagnosing physician: Gretta Began MD Electronically signed by Gretta Began MD on 02/18/2020 at 6:41:36 PM.    Final    ECHOCARDIOGRAM LIMITED  Result Date: 02/15/2020    ECHOCARDIOGRAM LIMITED REPORT   Patient Name:   DIONTAE ROUTE Date of Exam: 02/15/2020 Medical Rec #:  [id number]      Height:       68.0 in Accession #:    [id number]     Weight:       152.1 lb Date of Birth:  05-19-1986       BSA:  1.819 m Patient Age:    33 years       BP:           139/86 mmHg Patient Gender: M              HR:           78 bpm. Exam Location:  Inpatient Procedure: Limited Echo, Limited Color Doppler and Cardiac Doppler Indications:    Stroke 434.91 / I163.9                 Endocarditis I38  History:        Patient has no prior history of Echocardiogram examinations.                 HIV.  Sonographer:    Leta Jungling RDCS Referring Phys: [id number] TIMOTHY S OPYD IMPRESSIONS  1. Left ventricular ejection fraction, by estimation, is 55 to 60%. The left ventricle has normal function. The left ventricle has no regional wall motion abnormalities. Left ventricular diastolic parameters were normal.  2. The mitral valve is grossly normal. Trivial mitral valve regurgitation. No evidence of mitral stenosis.  3. The aortic valve is tricuspid. Aortic valve regurgitation is not visualized. No aortic stenosis is present.  4. There is normal pulmonary artery systolic pressure. The estimated right ventricular systolic pressure is 24.0 mmHg.  5. The inferior vena cava is normal in size with greater than 50% respiratory variability, suggesting right atrial pressure of 3 mmHg. Conclusion(s)/Recommendation(s): No evidence of valvular vegetations on this transthoracic echocardiogram. Would recommend a transesophageal echocardiogram to exclude infective endocarditis if clinically indicated. FINDINGS  Left Ventricle: Left ventricular ejection fraction, by estimation, is 55 to 60%. The left ventricle has normal function. The left ventricle has no regional wall motion abnormalities. The left ventricular internal  cavity size was normal in size. There is  no left ventricular hypertrophy. Right Ventricle: There is normal pulmonary artery systolic pressure. The tricuspid regurgitant velocity is 2.29 m/s, and with an assumed right atrial pressure of 3 mmHg, the estimated right ventricular systolic pressure is 24.0 mmHg. Left Atrium: Left atrial size was normal in size. Right Atrium: Right atrial size was normal in size. Pericardium: A small pericardial effusion is present. The pericardial effusion is circumferential. Mitral Valve: The mitral valve is grossly normal. Trivial mitral valve regurgitation. No evidence of mitral valve stenosis. There is no evidence of mitral valve vegetation. Tricuspid Valve: The tricuspid valve is grossly normal. Tricuspid valve regurgitation is trivial. No evidence of tricuspid stenosis. There is no evidence of tricuspid valve vegetation. Aortic Valve: The aortic valve is tricuspid. Aortic valve regurgitation is not visualized. No aortic stenosis is present. There is no evidence of aortic valve vegetation. Pulmonic Valve: The pulmonic valve was grossly normal. Pulmonic valve regurgitation is trivial. No evidence of pulmonic stenosis. There is no evidence of pulmonic valve vegetation. Venous: The inferior vena cava is normal in size with greater than 50% respiratory variability, suggesting right atrial pressure of 3 mmHg. IAS/Shunts: The atrial septum is grossly normal.  LEFT VENTRICLE PLAX 2D LVIDd:         5.00 cm      Diastology LVIDs:         3.50 cm      LV e' lateral:   12.70 cm/s LV PW:         1.00 cm      LV E/e' lateral: 5.0 LV IVS:        1.00 cm  LV e' medial:    12.50 cm/s                             LV E/e' medial:  5.1  LV Volumes (MOD) LV vol d, MOD A2C: 151.0 ml LV vol d, MOD A4C: 177.0 ml LV vol s, MOD A2C: 73.1 ml LV vol s, MOD A4C: 68.9 ml LV SV MOD A2C:     77.9 ml LV SV MOD A4C:     177.0 ml LV SV MOD BP:      94.8 ml LEFT ATRIUM             Index       RIGHT ATRIUM            Index LA diam:        3.30 cm 1.81 cm/m  RA Area:     14.30 cm LA Vol (A2C):   54.3 ml 29.85 ml/m RA Volume:   36.30 ml  19.95 ml/m LA Vol (A4C):   44.8 ml 24.62 ml/m LA Biplane Vol: 53.9 ml 29.63 ml/m  AORTIC VALVE LVOT Vmax:   104.00 cm/s LVOT Vmean:  72.900 cm/s LVOT VTI:    0.168 m  AORTA Ao Root diam: 3.50 cm MITRAL VALVE               TRICUSPID VALVE MV Area (PHT): 2.66 cm    TR Peak grad:   21.0 mmHg MV Decel Time: 285 msec    TR Vmax:        229.00 cm/s MV E velocity: 63.40 cm/s MV A velocity: 75.80 cm/s  SHUNTS MV E/A ratio:  0.84        Systemic VTI: 0.17 m Lennie Odor MD Electronically signed by Lennie Odor MD Signature Date/Time: 02/15/2020/1:27:05 PM    Final

## 2020-02-21 NOTE — Progress Notes (Signed)
         [facility]    Date of Admission:  02/14/2020   Day 1 vancomycin         Tyler Vargas recent fevers were caused by methicillin-resistant coagulase-negative staph bacteremia (not MRSA).  This probably arose from the infiltrated IV in her right arm on 02/18/2020.  The swelling has resolved and her temperature is already coming down. I will continue vancomycin and repeat blood cultures tomorrow.  With such an acute event it is extremely unlikely that she could have developed endocarditis this quickly.  I do not feel that repeat echocardiography is warranted.       Cliffton Asters, MD [facility] [phone] pager   [phone] cell 02/21/2020, 12:10 PM

## 2020-02-21 NOTE — Progress Notes (Signed)
   02/21/20 1630  Family/Significant Other Communication  Family/Significant Other Update Called  updated pt's father elton on pt's status and plan of care, Pt's father asking if pt had any belongings with her , this RN informed family that the [place] that [place] clothing and a ring that is currently on pt.

## 2020-02-21 NOTE — Plan of Care (Signed)
  Problem: Education: Goal: Knowledge of General Education information will improve Description: Including pain rating scale, medication(s)/side effects and non-pharmacologic comfort measures Outcome: Not Progressing Note: No evidence of learning noted; additional time needed to complete goals   Problem: Health Behavior/Discharge Planning: Goal: Ability to manage health-related needs will improve Outcome: Not Progressing Note: No evidence of learning noted; additional time needed to complete goals   Problem: Clinical Measurements: Goal: Ability to maintain clinical measurements within normal limits will improve Outcome: Progressing Goal: Will remain free from infection Outcome: Progressing Goal: Diagnostic test results will improve Outcome: Progressing Goal: Respiratory complications will improve Outcome: Progressing Goal: Cardiovascular complication will be avoided Outcome: Progressing   Problem: Activity: Goal: Risk for activity intolerance will decrease Outcome: Progressing   Problem: Nutrition: Goal: Adequate nutrition will be maintained Outcome: Progressing Note: Nutrition being provided via tube feeding   Problem: Coping: Goal: Level of anxiety will decrease Outcome: Progressing   Problem: Elimination: Goal: Will not experience complications related to bowel motility Outcome: Progressing Goal: Will not experience complications related to urinary retention Outcome: Progressing   Problem: Pain Managment: Goal: General experience of comfort will improve Outcome: Progressing   Problem: Safety: Goal: Ability to remain free from injury will improve Outcome: Progressing   Problem: Skin Integrity: Goal: Risk for impaired skin integrity will decrease Outcome: Progressing   Problem: Education: Goal: Knowledge of disease or condition will improve Outcome: Progressing Goal: Knowledge of secondary prevention will improve Outcome: Progressing Goal: Individualized  Educational Video(s) Outcome: Progressing   Problem: Coping: Goal: Will verbalize positive feelings about self Outcome: Not Progressing Note: Patient is non-verbal; no progress being made towards goals Goal: Will identify appropriate support needs Outcome: Not Progressing Note: Patient is non-verbal; no progress being made towards goals   Problem: Health Behavior/Discharge Planning: Goal: Ability to manage health-related needs will improve Outcome: Not Progressing Note: Patient is non-verbal; no progress being made towards goals

## 2020-02-22 DIAGNOSIS — R7881 Bacteremia: Secondary | ICD-10-CM

## 2020-02-22 DIAGNOSIS — B957 Other staphylococcus as the cause of diseases classified elsewhere: Secondary | ICD-10-CM

## 2020-02-22 DIAGNOSIS — R531 Weakness: Secondary | ICD-10-CM

## 2020-02-22 DIAGNOSIS — Z7189 Other specified counseling: Secondary | ICD-10-CM

## 2020-02-22 DIAGNOSIS — Z515 Encounter for palliative care: Secondary | ICD-10-CM

## 2020-02-22 LAB — GLUCOSE, CAPILLARY
Glucose-Capillary: 117 mg/dL — ABNORMAL HIGH (ref 70–99)
Glucose-Capillary: 119 mg/dL — ABNORMAL HIGH (ref 70–99)
Glucose-Capillary: 130 mg/dL — ABNORMAL HIGH (ref 70–99)
Glucose-Capillary: 134 mg/dL — ABNORMAL HIGH (ref 70–99)
Glucose-Capillary: 136 mg/dL — ABNORMAL HIGH (ref 70–99)
Glucose-Capillary: 145 mg/dL — ABNORMAL HIGH (ref 70–99)
Glucose-Capillary: 95 mg/dL (ref 70–99)

## 2020-02-22 MED ORDER — SENNOSIDES-DOCUSATE SODIUM 8.6-50 MG PO TABS: 1.0000 | ORAL_TABLET | Freq: Every evening | ORAL | Status: DC | PRN

## 2020-02-22 NOTE — Progress Notes (Signed)
Patient ID: Tyler Vargas, adult   DOB: Mar 16, 1986, 34 y.o.   MRN: [id number]          [facility]    Date of Admission:  02/14/2020   Day 2 vancomycin         Both sets of recent blood cultures were positive for coagulase-negative staph but it turns out that there are different species in each set.  This suggests that they may reflect 2 contaminated sets rather than true bacteremia.  However he did have an infiltrated IV in his right arm and he has defervesced on vancomycin.  Therefore I favor continuing vancomycin for 5 more days.  I recommend continuing Triumeq and prophylactic trimethoprim sulfamethoxazole down his feeding tube.  He has follow-up scheduled in our clinic 03/29/2020.  I will sign off now.  Please call if I can be of further assistance while he is here.         Cliffton Asters, MD [facility] [phone] pager   [phone] cell 02/22/2020, 1:16 PM

## 2020-02-22 NOTE — Progress Notes (Signed)
PMT no charge note  Call placed and I was able to reach Hector's father Mr. Khaled Herda at [phone].  Introduced myself and palliative care as follows:  Palliative medicine is specialized medical care for people living with serious illness. It focuses on providing relief from the symptoms and stress of a serious illness. The goal is to improve quality of life for both the patient and the family.   Goals of care: Broad aims of medical therapy in relation to the patient's values and preferences. Our aim is to provide medical care aimed at enabling patients to achieve the goals that matter most to them, given the circumstances of their particular medical situation and their constraints.   Mr. Voris is thankful for the care the patient is receiving here in the hospital.  He is thankful for the communication he has received from [facility] MD.  He recaps that he has been told that the patient has HIV, was using drugs such as cocaine and has been admitted with acute stroke as well as COVID-19 pneumonia.  Patient's father also understands that the patient has a bacterial infection in the blood.  He recalls being told that the patient has serious potentially irreversible illness and is not going to get better.  We discussed about PEG tube/LTAC versus full scope of comfort measures.  Reviewed both of those options in great detail.  All of Mr. Woodyard questions addressed to the best of my ability.  Plan: Mr. Otterson request for continuation of current mode of care for the next 24-48 hours.  During this time, he will discuss further with the patient's mother and siblings.  Overall, Mr. Dupree states that he is not leaning towards PEG tube/LTAC option.  If the patient has no chance of meaningful recovery, he would prefer comfort care and transfer to residential hospice.  Palliative medicine team to continue to follow along and help guide next steps, appropriate decision making and disposition options.  Rosalin Hawking MD [place] [facility] No charge.

## 2020-02-22 NOTE — Progress Notes (Addendum)
PROGRESS NOTE                                                                                                                                                                                                             Patient Demographics:    Tyler Vargas, is a 34 y.o. adult, DOB - 05-20-1986, ZOX:[id number]  Outpatient Primary MD for the patient is Patient, No Pcp Per   Admit date - 02/14/2020   LOS - 8  Chief Complaint  Patient presents with  . Code Stroke       Brief Narrative: Patient is a 34 y.o. 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  Hospital course complicated by persistent encephalopathy, COVID-19 pneumonia, and Staph bacteremia.  See below for further details.  Significant Events: 3/27>> admit to [facility] for acute CVA. 3/28>> EEG negative for seizures 3/28>> EF 55-60%, no obvious valvular abnormalities. 3/31>>cortak tube inserted 4/1>> Recurrent fever>>Blood cultures-Prelim MRSA 4/1>> EEG negative for seizures 4/1>> CT head negative for acute abnormalities 4/3>>d/w patients father-DNR  COVID-19 medications: Steroids: 3/28>>4/2 Remdesivir: 3/28>>4/1  Antibiotics: Vancomycin 4/3>> Penicillin G: 3/27>> 3/28  Microbiology data: 4/4>> blood culture: Pending 4/1>>blood culture:staph simulans 3/27>> Blood culture: negative 3/27>> CSF culture: Negative 3/27>> urine culture: Multiple morphotypes 3/27>> VDRL CSF: Nonreactive 3/27>> CSF cryptococcal antigen negative  DVT prophylaxis: SQ Lovenox  Procedures: 3/27 >>lumbar puncture: By ED MD  Consults: ID Neuro Palliative care    Subjective:   Tracking me a bit better today-he was not doing that yesterday.  Nonverbal-does not follow commands.   Assessment  & Plan :   Acute CVA: Remains aphasic-exam is difficult because of encephalopathy.  He  has obvious left-sided weakness-but is not moving his right side as well-but picture is clouded by encephalopathy.  Repeat CT head on 4/1 without any new findings.  Spot EEG on 4/1 without obvious seizure-like activity.  Extensive stroke work-up/neurology evaluation completed-CTA neck without any significant extracranial stenosis, echo without any obvious embolic source, transcranial Doppler negative for PFO.  Given ongoing encephalopathy-repeat CT head 4/1 was negative for acute abnormalities.  This MD spoke with Dr. 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  No improvement in encephalopathy-suspect will require a PEG tube.  However has very poor overall prognosis-before proceeding with PEG tube placement-have consulted palliative care for goals of care discussion.  Covid 19 Viral pneumonia: Remains stable on room air-has completed a course of steroids/remdesivir.  Fever: afebrile  O2 requirements:  SpO2: 95 %   COVID-19 Labs: Recent Labs    02/20/20 0541 02/21/20 0343  DDIMER 2.55*  --   FERRITIN 745* 843*  CRP 4.5* 2.0*    No results found for: BNP  No results for input(s): PROCALCITON in the last 168 hours.  Lab Results  Component Value Date   SARSCOV2NAA POSITIVE (A) 02/14/2020    ?  Aseptic meningitis: CSF with elevated protein levels-and slight leukocytosis-mostly lymphocytes.  CSF cultures negative-VDRL CSF negative-CSF cryptococcal antigen negative.  Now with fever-more encephalopathic-repeating CT head and checking spot EEG.  Staph bacteremia: Had persistent fever on 4/1-blood cultures done on 4/1-prelim result positive for MRSA-however further culture data may actually be suggestive of coag negative staph.  Remains on IV vancomycin-repeat blood cultures on 4/4  pending.  ID following.   After discussion with ID-unlikely that his endocarditis-hence no need to pursue TEE.  Recent echo was negative for vegetations.  Acute metabolic encephalopathy: Remains encephalopathic-not following commands.Repeat CT head on 4/1 without any acute findings.  EEG on 4/1 without seizure-like activity.  Suspect etiology related to encephalitis/aseptic meningitis related to HIV.  Note CSF VDRL and CSF cryptococcal antigen negative.  Continue supportive care.    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  History of syphilis: CSF VDRL negative-not likely to have neurosyphilis as the cause of CVA.  No longer on penicillin G.   Cocaine use: We will counsel when more awake and alert  Right upper extremity swelling: Likely secondary to infiltrated IV-Doppler ultrasound on 3/31 neg for DVT.  Goals of care: Long discussion with father again on 4/3-patient is not married-does not have any children-father was already aware of patient's HIV diagnoses from patient's sister who learned about the diagnosis from patient's roommate.  He is aware of the patient's overall tenuous condition-and underlying serious medical issues as outlined above-he is really appreciative of our efforts to take care of his son.  He understands the critical nature of the situation-and that there is potential for further decline resulting in death during this hospital stay.  He also understands that if the patient does survive-patient may require PEG tube-and may have significant long-term neurological sequelae from his encephalitis/CVA.  Per patient's father-he does not want his son to suffer-and agrees that if patient were to sustain a cardiac arrest-he would not want for son to be resuscitated.  I have placed a DNR order-I have spoken with the palliative care MD on call-they will arrange for family meeting-and delineate further goals of care regarding PEG tube,  disposition issues etc.  Nutrition Problem: Nutrition Problem: Increased nutrient needs Etiology: chronic illness(HIV/AIDS) Signs/Symptoms: estimated needs Interventions: Tube feeding, Prostat  ABG:    Component Value Date/Time   PHART 7.254 (L) 03/07/2014 1019   PCO2ART 40.7 03/07/2014 1019   PO2ART 107.0 (H) 03/07/2014 1019   HCO3 18.0 (L) 03/07/2014 1019   TCO2 27 02/14/2020 1911   ACIDBASEDEF 9.0 (H) 03/07/2014 1019   O2SAT 97.0 03/07/2014 1019    Vent Settings: N/A  Condition -extremely guarded  Family Communication  : Spoke at length with father-on 4/4  Code Status :  DNR  Diet :  Diet Order            Diet NPO time specified  Diet effective now               Disposition Plan  :  Remain hospitalized  Barriers to discharge: CVA-aphasic-dysphagia-ongoing encephalopathy-fever  Antimicorbials  :    Anti-infectives (From admission, onward)   Start     Dose/Rate Route Frequency Ordered Stop   02/21/20 0900  vancomycin (VANCOCIN) IVPB 1000 mg/200 mL premix     1,000 mg 200 mL/hr over 60 Minutes Intravenous Every 12 hours 02/20/20 2000     02/20/20 2030  vancomycin (VANCOREADY) IVPB 1250 mg/250 mL     1,250 mg 166.7 mL/hr over 90 Minutes Intravenous  Once 02/20/20 1956 02/20/20 2156   02/19/20 1400  abacavir-dolutegravir-lamiVUDine (TRIUMEQ) 600-50-300 MG per tablet 1 tablet     1 tablet Oral Daily 02/19/20 1358     02/19/20 1000  sulfamethoxazole-trimethoprim (BACTRIM) 200-40 MG/5ML suspension 20 mL     20 mL Per Tube Daily 02/19/20 0901     02/17/20 0000  bictegravir-emtricitabine-tenofovir AF (BIKTARVY) 50-200-25 MG TABS tablet     1 tablet Oral Daily 02/17/20 1250 03/18/20 2359   02/16/20 1000  remdesivir 100 mg in sodium chloride 0.9 % 100 mL IVPB     100 mg 200 mL/hr over 30 Minutes Intravenous Daily 02/15/20 1016 02/19/20 0858   02/15/20 1100  remdesivir 200 mg in sodium chloride 0.9% 250 mL IVPB     200 mg 580 mL/hr over 30 Minutes Intravenous Once  02/15/20 1016 02/15/20 1150   02/15/20 1000  bictegravir-emtricitabine-tenofovir AF (BIKTARVY) 50-200-25 MG per tablet 1 tablet  Status:  Discontinued    Note to Pharmacy: Try to take at the same time each day with or without food.     1 tablet Oral Daily 02/14/20 2231 02/19/20 1358   02/15/20 1000  sulfamethoxazole-trimethoprim (BACTRIM DS) 800-160 MG per tablet 1 tablet  Status:  Discontinued     1 tablet Oral Daily 02/14/20 2231 02/19/20 0901   02/15/20 0530  vancomycin (VANCOCIN) IVPB 1000 mg/200 mL premix  Status:  Discontinued     1,000 mg 200 mL/hr over 60 Minutes Intravenous Every 8 hours 02/14/20 2120 02/15/20 0957   02/14/20 2215  penicillin G potassium 4 Million Units in dextrose 5 % 250 mL IVPB  Status:  Discontinued     4 Million Units 250 mL/hr over 60 Minutes Intravenous Every 4 hours 02/14/20 2202 02/15/20 1653   02/14/20 2200  ceFEPIme (MAXIPIME) 2 g in sodium chloride 0.9 % 100 mL IVPB  Status:  Discontinued     2 g 200 mL/hr over 30 Minutes Intravenous Every 8 hours 02/14/20 2120 02/14/20 2145   02/14/20 2130  vancomycin (VANCOREADY) IVPB 1500 mg/300 mL     1,500 mg 150 mL/hr over 120 Minutes Intravenous  Once 02/14/20 2120 02/15/20 0044   02/14/20 2115  ceFEPIme (MAXIPIME) 2 g in sodium chloride 0.9 % 100 mL IVPB  Status:  Discontinued     2 g 200 mL/hr over 30 Minutes Intravenous  Once 02/14/20 2103 02/14/20 2120   02/14/20 2115  metroNIDAZOLE (FLAGYL) IVPB 500 mg  Status:  Discontinued     500 mg 100 mL/hr over 60 Minutes Intravenous  Once 02/14/20 2103 02/14/20 2120   02/14/20 2115  vancomycin (VANCOCIN) IVPB 1000 mg/200 mL premix  Status:  Discontinued     1,000 mg 200 mL/hr over 60 Minutes Intravenous  Once 02/14/20 2103 02/14/20 2120      Inpatient Medications  Scheduled Meds: .  stroke: mapping our early stages of recovery book   Does not apply Once  . abacavir-dolutegravir-lamiVUDine  1 tablet Oral Daily  . aspirin  325 mg Per NG tube Daily  .  enoxaparin (LOVENOX) injection  40 mg Subcutaneous Q24H  . feeding supplement (PRO-STAT SUGAR FREE 64)  30 mL Per Tube BID  . influenza vac split quadrivalent PF  0.5 mL Intramuscular Tomorrow-1000  . pneumococcal 23 valent vaccine  0.5 mL Intramuscular Tomorrow-1000  . sulfamethoxazole-trimethoprim  20 mL Per Tube Daily   Continuous Infusions: . dextrose 5% lactated ringers 10 mL/hr at 02/21/20 0400  . feeding supplement (OSMOLITE 1.5 CAL) 1,000 mL (02/22/20 1037)  . vancomycin 1,000 mg (02/22/20 1018)   PRN Meds:.acetaminophen **OR** acetaminophen (TYLENOL) oral liquid 160 mg/5 mL **OR** acetaminophen, senna-docusate   Time Spent in minutes  35  See all Orders from today for further details   Jeoffrey Massed M.D on 02/22/2020 at 11:07 AM  To page go to [email or web address] - use universal password  [facility] -  Office  [phone]    Objective:   Vitals:   02/22/20 0400 02/22/20 0500 02/22/20 0502 02/22/20 0800  BP: 117/69 107/73  112/77  Pulse: (!) 102 97  (!) 102  Resp: (!) Temp: 99.5 F (37.5 C)  99.3 F (37.4 C) 98.3 F (36.8 C)  TempSrc: Oral   Axillary  SpO2: 97% 94%  95%  Weight:      Height:        Wt Readings from Last 3 Encounters:  02/21/20 64.8 kg  01/28/20 69.9 kg  10/30/19 57.2 kg     Intake/Output Summary (Last 24 hours) at 02/22/2020 1107 Last data filed at 02/22/2020 0400 Gross per 24 hour  Intake 1737.73 ml  Output 900 ml  Net 837.73 ml     Physical Exam Gen Exam:remains encephalopathic-aphasic-unchanged over the past few days HEENT:atraumatic, normocephalic Chest: B/L clear to auscultation anteriorly CVS:S1S2 regular Abdomen:soft non tender, non distended Extremities:no edema Neurology: Difficult exam but seems to be moving his legs to pain. Skin: no rash   Data Review:    CBC Recent Labs  Lab 02/19/20 0450 02/19/20 1431 02/20/20 0541 02/21/20 0343  WBC 8.2 12.7* 9.0 4.4  HGB 11.1* 11.3* 11.3* 11.7*  HCT  34.6* 35.2* 35.8* 36.2*  PLT 690* 710* 684* 581*  MCV 90.8 91.7 92.7 90.7  MCH 29.1 29.4 29.3 29.3  MCHC 32.1 32.1 31.6 32.3  RDW 15.2 15.6* 15.7* 15.6*  LYMPHSABS  --  0.7  --   --   MONOABS  --  0.2  --   --   EOSABS  --  0.0  --   --   BASOSABS  --  0.0  --   --     Chemistries  Recent Labs  Lab 02/17/20 0313 02/18/20 0336 02/19/20 0450 02/20/20 0541 02/21/20 0343  NA 136 136 136 135 135  K 4.5 3.8 3.6 3.9 4.2  CL 99 103 101 100 101  CO2 GLUCOSE 102* 371* 136* 106* 107*  BUN CREATININE 0.78 0.75 0.82 0.98 0.90  CALCIUM 9.0 8.6* 8.4* 8.5* 8.8*  AST 22 43* 60* 81* 55*  ALT 32 31  ALKPHOS 48 41 57 72 71  BILITOT 0.3 <0.1* 0.3 0.5 0.3   ------------------------------------------------------------------------------------------------------------------ No  results for input(s): CHOL, HDL, LDLCALC, TRIG, CHOLHDL, LDLDIRECT in the last 72 hours.  Lab Results  Component Value Date   HGBA1C 5.9 (H) 02/15/2020   ------------------------------------------------------------------------------------------------------------------ No results for input(s): TSH, T4TOTAL, T3FREE, THYROIDAB in the last 72 hours.  Invalid input(s): FREET3 ------------------------------------------------------------------------------------------------------------------ Recent Labs    02/20/20 0541 02/21/20 0343  FERRITIN 745* 843*    Coagulation profile No results for input(s): INR, PROTIME in the last 168 hours.  Recent Labs    02/20/20 0541  DDIMER 2.55*    Cardiac Enzymes No results for input(s): CKMB, TROPONINI, MYOGLOBIN in the last 168 hours.  Invalid input(s): CK ------------------------------------------------------------------------------------------------------------------ No results found for: BNP  Micro Results Recent Results (from the past 240 hour(s))  Culture, blood (Routine X 2) w Reflex to ID Panel     Status: None   Collection  Time: 02/14/20  9:40 PM   Specimen: BLOOD  Result Value Ref Range Status   Specimen Description BLOOD RIGHT ARM  Final   Special Requests   Final    BOTTLES DRAWN AEROBIC AND ANAEROBIC Blood Culture adequate volume   Culture   Final    NO GROWTH 5 DAYS Performed at [facility], [address] [place], [place] [address]    Report Status 02/19/2020 FINAL  Final  Culture, blood (Routine X 2) w Reflex to ID Panel     Status: None   Collection Time: 02/14/20  9:46 PM   Specimen: BLOOD  Result Value Ref Range Status   Specimen Description BLOOD RIGHT FOREARM  Final   Special Requests   Final    BOTTLES DRAWN AEROBIC AND ANAEROBIC Blood Culture adequate volume   Culture   Final    NO GROWTH 5 DAYS Performed at [facility], [address] [place], [place] [address]    Report Status 02/19/2020 FINAL  Final  Urine culture     Status: Abnormal   Collection Time: 02/14/20  9:46 PM   Specimen: In/Out Cath Urine  Result Value Ref Range Status   Specimen Description IN/OUT CATH URINE  Final   Special Requests   Final    NONE Performed at [facility], [address] [place], [place] [address]    Culture MULTIPLE SPECIES PRESENT, SUGGEST RECOLLECTION (A)  Final   Report Status 02/16/2020 FINAL  Final  SARS CORONAVIRUS 2 (TAT 6-24 HRS)     Status: Abnormal   Collection Time: 02/14/20 10:55 PM  Result Value Ref Range Status   SARS Coronavirus 2 POSITIVE (A) NEGATIVE Final    Comment: RESULT CALLED TO, READ BACK BY AND VERIFIED WITH: K. GUY,RN 0335 02/15/2020 T. TYSOR (NOTE) SARS-CoV-2 target nucleic acids are DETECTED. The SARS-CoV-2 RNA is generally detectable in upper and lower respiratory specimens during the acute phase of infection. Positive results are indicative of the presence of SARS-CoV-2 RNA. Clinical correlation with patient history and other diagnostic information is  necessary to determine patient infection status. Positive results do not rule out  bacterial infection or co-infection with other viruses.  The expected result is Negative. Fact Sheet for Patients: HairSlick.no Fact Sheet for Healthcare Providers: quierodirigir.com This test is not yet approved or cleared by the [place] FDA and  has been authorized for detection and/or diagnosis of SARS-CoV-2 by FDA under an Emergency Use Authorization (EUA). This EUA will remain  in effect (meaning this test can be used) for the  duration of the COVID-19 declaration under Section 564(b)(1) of the Act,  21 U.S.C. section 360bbb-3(b)(1), unless the authorization is terminated or revoked sooner. Performed at [facility], [address] [place], [place] [address]   CSF culture with Stat gram stain     Status: None   Collection Time: 02/14/20 11:35 PM   Specimen: CSF; Cerebrospinal Fluid  Result Value Ref Range Status   Specimen Description CSF  Final   Special Requests NONE  Final   Gram Stain   Final    CYTOSPIN SMEAR WBC PRESENT,BOTH PMN AND MONONUCLEAR NO ORGANISMS SEEN    Culture   Final    NO GROWTH 3 DAYS Performed at [facility], [address] [place], [place] [address]    Report Status 02/18/2020 FINAL  Final  Culture, blood (routine x 2)     Status: Abnormal (Preliminary result)   Collection Time: 02/19/20  7:39 AM   Specimen: BLOOD  Result Value Ref Range Status   Specimen Description BLOOD RIGHT ANTECUBITAL  Final   Special Requests   Final    BOTTLES DRAWN AEROBIC AND ANAEROBIC Blood Culture adequate volume   Culture  Setup Time   Final    GRAM POSITIVE COCCI IN CLUSTERS IN BOTH AEROBIC AND ANAEROBIC BOTTLES Organism ID to follow CRITICAL RESULT CALLED TO, READ BACK BY AND VERIFIED WITH: B MANCHERIL PHARMD 2148 02/20/20 A BROWNING    Culture (A)  Final    STAPHYLOCOCCUS SIMULANS CORRECTED ON 04/04 AT 1031: PREVIOUSLY REPORTED AS STAPHYLOCOCCUS SPECIES (COAGULASE  NEGATIVE) SUSCEPTIBILITIES TO FOLLOW Performed at [facility], [address] [place], [place] [address]    Report Status PENDING  Incomplete  Blood Culture ID Panel (Reflexed)     Status: Abnormal   Collection Time: 02/19/20  7:39 AM  Result Value Ref Range Status   Enterococcus species NOT DETECTED NOT DETECTED Final   Listeria monocytogenes NOT DETECTED NOT DETECTED Final   Staphylococcus species DETECTED (A) NOT DETECTED Final    Comment: Methicillin (oxacillin) resistant coagulase negative staphylococcus. Possible blood culture contaminant (unless isolated from more than one blood culture draw or clinical case suggests pathogenicity). No antibiotic treatment is indicated for blood  culture contaminants. CRITICAL RESULT CALLED TO, READ BACK BY AND VERIFIED WITH: B MANCHERIL PHARMD 2148 02/20/20 A BROWNING    Staphylococcus aureus (BCID) NOT DETECTED NOT DETECTED Final   Methicillin resistance DETECTED (A) NOT DETECTED Final    Comment: CRITICAL RESULT CALLED TO, READ BACK BY AND VERIFIED WITH: B MANCHERIL PHARMD 2148 02/20/20 A BROWNING    Streptococcus species NOT DETECTED NOT DETECTED Final   Streptococcus agalactiae NOT DETECTED NOT DETECTED Final   Streptococcus pneumoniae NOT DETECTED NOT DETECTED Final   Streptococcus pyogenes NOT DETECTED NOT DETECTED Final   Acinetobacter baumannii NOT DETECTED NOT DETECTED Final   Enterobacteriaceae species NOT DETECTED NOT DETECTED Final   Enterobacter cloacae complex NOT DETECTED NOT DETECTED Final   Escherichia coli NOT DETECTED NOT DETECTED Final   Klebsiella oxytoca NOT DETECTED NOT DETECTED Final   Klebsiella pneumoniae NOT DETECTED NOT DETECTED Final   Proteus species NOT DETECTED NOT DETECTED Final   Serratia marcescens NOT DETECTED NOT DETECTED Final   Haemophilus influenzae NOT DETECTED NOT DETECTED Final   Neisseria meningitidis NOT DETECTED NOT DETECTED Final   Pseudomonas aeruginosa NOT DETECTED NOT DETECTED Final    Candida albicans NOT DETECTED NOT DETECTED Final   Candida glabrata NOT DETECTED NOT DETECTED Final   Candida krusei NOT DETECTED NOT DETECTED Final   Candida parapsilosis NOT  DETECTED NOT DETECTED Final   Candida tropicalis NOT DETECTED NOT DETECTED Final    Comment: Performed at [facility], [place] [address], [place], [place] [address]  Culture, blood (routine x 2)     Status: Abnormal (Preliminary result)   Collection Time: 02/19/20  7:47 AM   Specimen: BLOOD  Result Value Ref Range Status   Specimen Description BLOOD RIGHT ANTECUBITAL  Final   Special Requests   Final    BOTTLES DRAWN AEROBIC AND ANAEROBIC Blood Culture adequate volume   Culture  Setup Time   Final    GRAM POSITIVE COCCI IN CLUSTERS IN BOTH AEROBIC AND ANAEROBIC BOTTLES CRITICAL RESULT CALLED TO, READ BACK BY AND VERIFIED WITH: E. PEREZ, PHARMD AT 1908 ON 02/20/20 BY C. JESSUP, MT    Culture (A)  Final    STAPHYLOCOCCUS EPIDERMIDIS SUSCEPTIBILITIES TO FOLLOW Performed at [facility], [place] [address] [place], [place] [address]    Report Status PENDING  Incomplete    Radiology Reports EEG  Result Date: 02/15/2020 Lora Havens, MD     02/15/2020  4:13 PM Patient Name: Brach Birdsall MRN: [id number] Epilepsy Attending: Lora Havens Referring Physician/Provider: Dr Karena Addison Aroor Date: 02/15/2020 Duration: 23.26 mins Patient history: 21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 EEG to evaluate for seizure Level of alertness: asleep AEDs during EEG study: None Technical aspects: This EEG study was done with scalp electrodes positioned according to the 10-20 International system of electrode placement. Electrical activity was acquired at a sampling rate of  500Hz  and reviewed with a high frequency filter of 70Hz  and a low frequency filter of 1Hz . EEG data were recorded continuously and digitally stored. DESCRIPTION:  Sleep was characterized by vertex waves, sleep spindles, maximal frontocentral. Hyperventilation and photic stimulation were not performed. IMPRESSION: This study during sleep only is within normal limits. No seizures or epileptiform discharges were seen throughout the recording. If suspicion for interictal activity remains a concern, a prolonged study can be considered. Priyanka Barbra Sarks   CT ANGIO HEAD W OR WO CONTRAST  Result Date: 02/14/2020 CLINICAL DATA:  Stroke. Right-sided weakness and speech disturbance. EXAM: CT ANGIOGRAPHY HEAD AND NECK CT PERFUSION BRAIN TECHNIQUE: Multidetector CT imaging of the head and neck was performed using the standard protocol during bolus administration of intravenous contrast. Multiplanar CT image reconstructions and MIPs were obtained to evaluate the vascular anatomy. Carotid stenosis measurements (when applicable) are obtained utilizing NASCET criteria, using the distal internal carotid diameter as the denominator. Multiphase CT imaging of the brain was performed following IV bolus contrast injection. Subsequent parametric perfusion maps were calculated using RAPID software. CONTRAST:  40mL OMNIPAQUE IOHEXOL 350 MG/ML SOLN; 21mL OMNIPAQUE IOHEXOL 350 MG/ML SOLN COMPARISON:  None. FINDINGS: CTA NECK FINDINGS Aortic arch: Standard 3 vessel aortic arch with widely patent arch vessel origins. Right carotid system: Patent and smooth without evidence of stenosis or dissection. Left carotid system: Patent and smooth without evidence of stenosis or dissection. Vertebral arteries: Patent, smooth, and codominant without evidence of stenosis or dissection. Skeleton: Poor dentition with multiple periapical erosions. Other neck: Borderline enlarged level II lymph nodes bilaterally. Upper chest: Centrilobular emphysema. Small  superior mediastinal lymph nodes. Review of the MIP images confirms the above findings CTA HEAD FINDINGS  Anterior circulation: The internal carotid arteries are widely patent from skull base to carotid termini. The A1 and M1 segments are widely patent with the left A1 segment being mildly hypoplastic. There is a decreased number of branch vessels in the right MCA superior division with suggestion of occlusion and intermittent reconstitution of distal M2 and M3 branches. There is also multifocal irregular narrowing of M3 branches in the right MCA inferior division, and there is mild left MCA branch vessel irregularity. No aneurysm is identified. Posterior circulation: The intracranial vertebral arteries are widely patent to the basilar. The basilar artery is widely patent. There are small posterior communicating arteries bilaterally. PCAs are patent with mild branch vessel irregularity more notable on the right without evidence of flow limiting proximal stenosis. No aneurysm is identified. Venous sinuses: Patent. Anatomic variants: None. Review of the MIP images confirms the above findings CT Brain Perfusion Findings: ASPECTS: 4 CBF (<30%) Volume: 4mL Perfusion (Tmax>6.0s) volume: 26mL Mismatch Volume: 57mL, however the core infarct on noncontrast head CT is not being picked up by the automated processing and the true penumbra is less than 12 mL Infarction Location: Right MCA IMPRESSION: 1. Multiple missing right MCA branch vessels in the superior division with wide patency of the M1 segment and no discrete proximal M2 occlusion identified. 2. Widely patent common carotid, internal carotid, and vertebral arteries. 3. CT perfusion demonstrating at most a small penumbra as detailed above. These results were called by telephone at the time of interpretation on 02/14/2020 at 7:55 p.m. to Dr. Laurence Slate, who verbally acknowledged these results. Electronically Signed   By: Sebastian Ache M.D.   On: 02/14/2020 20:22   CT HEAD WO  CONTRAST  Result Date: 02/19/2020 CLINICAL DATA:  Stroke, follow-up. EXAM: CT HEAD WITHOUT CONTRAST TECHNIQUE: Contiguous axial images were obtained from the base of the skull through the vertex without intravenous contrast. COMPARISON:  Head CT 02/07/2020, brain MRI 02/15/2020 FINDINGS: Brain: Again demonstrated are, now subacute, infarcts within the right basal ganglia, right internal capsule, right frontal operculum, right insula and subinsular region as well as left basal/internal capsule. These infarcts have not significant changed in extent as compared to prior head CT 02/15/2020. No significant mass effect. No midline shift. No evidence of hemorrhagic conversion. No new demarcated infarct is identified. No evidence of intracranial mass. No extra-axial fluid collection. Stable, mild generalized parenchymal atrophy. Vascular: Redemonstrated hyperdense right MCA branch within the right sylvian fissure. Skull: Normal. Negative for fracture or focal lesion. Sinuses/Orbits: Visualized orbits demonstrate no acute abnormality. Mild ethmoid sinus mucosal thickening. No significant mastoid effusion at the imaged levels IMPRESSION: Subacute infarcts within the right MCA territory as well as left basal ganglia/internal capsule, unchanged in extent as compared to head CT 02/15/2020. No significant mass effect. No hemorrhagic conversion. No interval infarct is identified. Stable, mild generalized parenchymal atrophy. Mild ethmoid sinus mucosal thickening. Electronically Signed   By: Jackey Loge DO   On: 02/19/2020 10:33   CT HEAD WO CONTRAST  Result Date: 02/15/2020 CLINICAL DATA:  Stroke follow-up. COVID-19 infection. History of HIV. EXAM: CT HEAD WITHOUT CONTRAST TECHNIQUE: Contiguous axial images were obtained from the base of the skull through the vertex without intravenous contrast. COMPARISON:  Head MRI 02/15/2020 FINDINGS: Brain: Acute right MCA infarcts involving the basal ganglia, internal capsule, insula,  and frontal operculum are unchanged as are small acute left basal ganglia and left internal capsule infarcts. A subcentimeter focus of hemorrhage in the right frontal operculum is unchanged. No new infarct,  new intracranial hemorrhage, midline shift, or extra-axial fluid collection is identified. Mild ventricular prominence for age is unchanged and likely reflective of mild cerebral atrophy. Vascular: Hyperdense right MCA branch vessels in the sylvian fissure. Skull: No fracture or suspicious osseous lesion. Sinuses/Orbits: Mild right sphenoid sinus mucosal thickening. Small right maxillary sinus mucous retention cyst. Clear mastoid air cells. Unremarkable orbits. Other: None. IMPRESSION: 1. Unchanged acute right MCA infarcts with unchanged small hemorrhage in the right frontal operculum. 2. Unchanged small acute left basal ganglia and internal capsule infarcts. 3. No new intracranial abnormality. Electronically Signed   By: Sebastian Ache M.D.   On: 02/15/2020 19:40   CT ANGIO NECK W OR WO CONTRAST  Result Date: 02/14/2020 CLINICAL DATA:  Stroke. Right-sided weakness and speech disturbance. EXAM: CT ANGIOGRAPHY HEAD AND NECK CT PERFUSION BRAIN TECHNIQUE: Multidetector CT imaging of the head and neck was performed using the standard protocol during bolus administration of intravenous contrast. Multiplanar CT image reconstructions and MIPs were obtained to evaluate the vascular anatomy. Carotid stenosis measurements (when applicable) are obtained utilizing NASCET criteria, using the distal internal carotid diameter as the denominator. Multiphase CT imaging of the brain was performed following IV bolus contrast injection. Subsequent parametric perfusion maps were calculated using RAPID software. CONTRAST:  40mL OMNIPAQUE IOHEXOL 350 MG/ML SOLN; 75mL OMNIPAQUE IOHEXOL 350 MG/ML SOLN COMPARISON:  None. FINDINGS: CTA NECK FINDINGS Aortic arch: Standard 3 vessel aortic arch with widely patent arch vessel origins. Right  carotid system: Patent and smooth without evidence of stenosis or dissection. Left carotid system: Patent and smooth without evidence of stenosis or dissection. Vertebral arteries: Patent, smooth, and codominant without evidence of stenosis or dissection. Skeleton: Poor dentition with multiple periapical erosions. Other neck: Borderline enlarged level II lymph nodes bilaterally. Upper chest: Centrilobular emphysema. Small superior mediastinal lymph nodes. Review of the MIP images confirms the above findings CTA HEAD FINDINGS Anterior circulation: The internal carotid arteries are widely patent from skull base to carotid termini. The A1 and M1 segments are widely patent with the left A1 segment being mildly hypoplastic. There is a decreased number of branch vessels in the right MCA superior division with suggestion of occlusion and intermittent reconstitution of distal M2 and M3 branches. There is also multifocal irregular narrowing of M3 branches in the right MCA inferior division, and there is mild left MCA branch vessel irregularity. No aneurysm is identified. Posterior circulation: The intracranial vertebral arteries are widely patent to the basilar. The basilar artery is widely patent. There are small posterior communicating arteries bilaterally. PCAs are patent with mild branch vessel irregularity more notable on the right without evidence of flow limiting proximal stenosis. No aneurysm is identified. Venous sinuses: Patent. Anatomic variants: None. Review of the MIP images confirms the above findings CT Brain Perfusion Findings: ASPECTS: 4 CBF (<30%) Volume: 0mL Perfusion (Tmax>6.0s) volume: 12mL Mismatch Volume: 12mL, however the core infarct on noncontrast head CT is not being picked up by the automated processing and the true penumbra is less than 12 mL Infarction Location: Right MCA IMPRESSION: 1. Multiple missing right MCA branch vessels in the superior division with wide patency of the M1 segment and no  discrete proximal M2 occlusion identified. 2. Widely patent common carotid, internal carotid, and vertebral arteries. 3. CT perfusion demonstrating at most a small penumbra as detailed above. These results were called by telephone at the time of interpretation on 02/14/2020 at 7:55 p.m. to Dr. Laurence Slate, who verbally acknowledged these results. Electronically Signed   By: Freida Busman  Mosetta Putt M.D.   On: 02/14/2020 20:22   MR BRAIN WO CONTRAST  Result Date: 02/15/2020 CLINICAL DATA:  Stroke.  HIV. EXAM: MRI HEAD WITHOUT CONTRAST TECHNIQUE: Multiplanar, multiecho pulse sequences of the brain and surrounding structures were obtained without intravenous contrast. COMPARISON:  CT head 02/14/2020 FINDINGS: Brain: Acute right MCA infarct involving the right lenticular nucleus as well as the right frontal operculum and right parietal operculum. Small area of acute infarction in the genu internal capsule on the right. Small area of associated hemorrhage in the right frontal operculum and right parietal operculum. Small areas of acute infarct in the left basal ganglia involving the genu internal capsule and posterior external capsule fibers. Ventricle size normal.  No midline shift.  Negative for mass lesion. Vascular: Normal arterial flow voids. Skull and upper cervical spine: No focal skeletal lesion. Sinuses/Orbits: Mild mucosal edema paranasal sinuses. Negative orbit Other: None IMPRESSION: Acute infarct right MCA territory involving the basal ganglia as well as the right frontal and parietal operculum where there are small areas of associated hemorrhage. Small area of acute infarct in the left internal capsule and external capsule. Electronically Signed   By: Marlan Palau M.D.   On: 02/15/2020 07:27   CT CEREBRAL PERFUSION W CONTRAST  Result Date: 02/14/2020 CLINICAL DATA:  Stroke. Right-sided weakness and speech disturbance. EXAM: CT ANGIOGRAPHY HEAD AND NECK CT PERFUSION BRAIN TECHNIQUE: Multidetector CT imaging of the  head and neck was performed using the standard protocol during bolus administration of intravenous contrast. Multiplanar CT image reconstructions and MIPs were obtained to evaluate the vascular anatomy. Carotid stenosis measurements (when applicable) are obtained utilizing NASCET criteria, using the distal internal carotid diameter as the denominator. Multiphase CT imaging of the brain was performed following IV bolus contrast injection. Subsequent parametric perfusion maps were calculated using RAPID software. CONTRAST:  40mL OMNIPAQUE IOHEXOL 350 MG/ML SOLN; 75mL OMNIPAQUE IOHEXOL 350 MG/ML SOLN COMPARISON:  None. FINDINGS: CTA NECK FINDINGS Aortic arch: Standard 3 vessel aortic arch with widely patent arch vessel origins. Right carotid system: Patent and smooth without evidence of stenosis or dissection. Left carotid system: Patent and smooth without evidence of stenosis or dissection. Vertebral arteries: Patent, smooth, and codominant without evidence of stenosis or dissection. Skeleton: Poor dentition with multiple periapical erosions. Other neck: Borderline enlarged level II lymph nodes bilaterally. Upper chest: Centrilobular emphysema. Small superior mediastinal lymph nodes. Review of the MIP images confirms the above findings CTA HEAD FINDINGS Anterior circulation: The internal carotid arteries are widely patent from skull base to carotid termini. The A1 and M1 segments are widely patent with the left A1 segment being mildly hypoplastic. There is a decreased number of branch vessels in the right MCA superior division with suggestion of occlusion and intermittent reconstitution of distal M2 and M3 branches. There is also multifocal irregular narrowing of M3 branches in the right MCA inferior division, and there is mild left MCA branch vessel irregularity. No aneurysm is identified. Posterior circulation: The intracranial vertebral arteries are widely patent to the basilar. The basilar artery is widely patent.  There are small posterior communicating arteries bilaterally. PCAs are patent with mild branch vessel irregularity more notable on the right without evidence of flow limiting proximal stenosis. No aneurysm is identified. Venous sinuses: Patent. Anatomic variants: None. Review of the MIP images confirms the above findings CT Brain Perfusion Findings: ASPECTS: 4 CBF (<30%) Volume: 0mL Perfusion (Tmax>6.0s) volume: 12mL Mismatch Volume: 12mL, however the core infarct on noncontrast head CT is not being picked  up by the automated processing and the true penumbra is less than 12 mL Infarction Location: Right MCA IMPRESSION: 1. Multiple missing right MCA branch vessels in the superior division with wide patency of the M1 segment and no discrete proximal M2 occlusion identified. 2. Widely patent common carotid, internal carotid, and vertebral arteries. 3. CT perfusion demonstrating at most a small penumbra as detailed above. These results were called by telephone at the time of interpretation on 02/14/2020 at 7:55 p.m. to Dr. Laurence Slate, who verbally acknowledged these results. Electronically Signed   By: Sebastian Ache M.D.   On: 02/14/2020 20:22   DG Chest Port 1 View  Result Date: 02/14/2020 CLINICAL DATA:  Acute CVA. Evaluate for pneumonia. EXAM: PORTABLE CHEST 1 VIEW COMPARISON:  Radiograph 07/07/2019. Lung apices from CT angiography earlier today. FINDINGS: Ill-defined opacity in the right mid lung abutting the minor fissure. Mild cardiomegaly. Normal mediastinal contours. No pulmonary edema, large pleural effusion or pneumothorax. No acute osseous abnormalities are seen. IMPRESSION: Ill-defined opacity in the right mid lung abutting the minor fissure, suspicious for pneumonia. Mild cardiomegaly. Electronically Signed   By: Narda Rutherford M.D.   On: 02/14/2020 21:58   DG Chest Port 1V same Day  Result Date: 02/19/2020 CLINICAL DATA:  COVID-19 positive, shortness of breath EXAM: PORTABLE CHEST 1 VIEW COMPARISON:   02/14/2020 FINDINGS: Interval placement of enteric tube coursing below the diaphragm, distal tip beyond the inferior margin of the film. Cardiomediastinal contours are stable. Subtle opacity within the right mid lung is again seen abutting the minor fissure, slightly improved from prior. No new focal airspace consolidation. No pleural effusion or pneumothorax. IMPRESSION: 1. Interval placement of enteric tube, distal tip beyond the inferior margin of the film. 2. Slight interval improvement in right mid lung opacity. Electronically Signed   By: Duanne Guess D.O.   On: 02/19/2020 08:54   DG Abd Portable 1V  Result Date: 02/15/2020 CLINICAL DATA:  MRI clearance EXAM: PORTABLE ABDOMEN - 1 VIEW COMPARISON:  None. FINDINGS: The bowel gas pattern is normal. No radio-opaque calculi or other significant radiographic abnormality are seen. IMPRESSION: No radial pain metallic foreign body in the abdomen or pelvis. Electronically Signed   By: Elige Ko   On: 02/15/2020 05:57   EEG adult  Result Date: 02/19/2020 Charlsie Quest, MD     02/19/2020  6:27 PM Patient Name: Tyler Vargas MRN: [id number] Epilepsy Attending: Charlsie Quest Referring Physician/Provider: Dr Jeoffrey Massed Date: 02/19/2020 Duration: 23.23 mins Patient history: 33yo M with acute R MCA stroke as well as L basal ganglia/internal capsue stroke who continues to be encephalopathic. EEG to evaluate for seizure. Level of alertness: awake AEDs during EEG study: None Technical aspects: This EEG study was done with scalp electrodes positioned according to the 10-20 International system of electrode placement. Electrical activity was acquired at a sampling rate of 500Hz  and reviewed with a high frequency filter of 70Hz  and a low frequency filter of 1Hz . EEG data were recorded continuously and digitally stored. DESCRIPTION: The posterior dominant rhythm consists of 8 Hz activity of moderate voltage (25-35 uV) seen predominantly in posterior head  regions, symmetric and reactive to eye opening and eye closing. EEG also showed intermittent generalized and maximal right frontotemporal region 3-6hz  theta-delta slowing. Hyperventilation and photic stimulation were not performed. ABNORMALITY - Intermittent slow, generalized and maximal right frontotemporal region IMPRESSION: This study is suggestive of cortical dysfunction in right frontotemporal region likely secondary to underlying stroke as well as mild diffuse  encephalopathy, non specific to etiology. No seizures or epileptiform discharges were seen throughout the recording. Priyanka O Yadav   VAS [place] TRANSCRANIAL DOPPLER W BUBBLES  Result Date: 02/17/2020  Transcranial Doppler with Bubble Indications: PFO. Comparison Study: No prior studies. Performing Technologist: Chanda Busing RVT  Examination Guidelines: A complete evaluation includes B-mode imaging, spectral Doppler, color Doppler, and power Doppler as needed of all accessible portions of each vessel. Bilateral testing is considered an integral part of a complete examination. Limited examinations for reoccurring indications may be performed as noted.  Summary:  A vascular evaluation was performed. The right P2 was studied. An IV was inserted into the patient's right Brachial. Verbal informed consent was obtained. No HITS. Negative for PFO.  Negative TCD Bubble study .No indication of right to left shunt noted. *See table(s) above for TCD measurements and observations.  Diagnosing physician: Delia Heady MD Electronically signed by Delia Heady MD on 02/17/2020 at 12:36:13 PM.    Final    CT HEAD CODE STROKE WO CONTRAST  Result Date: 02/14/2020 CLINICAL DATA:  Code stroke.  Right-sided weakness. EXAM: CT HEAD WITHOUT CONTRAST TECHNIQUE: Contiguous axial images were obtained from the base of the skull through the vertex without intravenous contrast. COMPARISON:  03/07/2014 FINDINGS: Brain: There is an acute right basal ganglia infarct involving the  caudate nucleus, lentiform nucleus, and a portion of the anterior limb of the right internal capsule. There is a subcentimeter focus of acute hemorrhage superficially in the right frontal operculum with a small amount of surrounding low-density, and there is also loss of gray-white differentiation indicative of an acute infarct in the anterior aspect of the right insula and more anterior aspect of the right frontal lobe. There is a new age indeterminate lacunar infarct in the genu of the left internal capsule. The ventricles are normal in size. There is no midline shift or extra-axial fluid collection. Vascular: Hyperdense right MCA. Skull: No fracture or suspicious osseous lesion. Sinuses/Orbits: Partially visualized small mucous retention cyst in the right maxillary sinus. Clear mastoid air cells. Rightward gaze. Other: None. ASPECTS [facility] Stroke Program Early CT Score) - Ganglionic level infarction (caudate, lentiform nuclei, internal capsule, insula, M1-M3 cortex): 1 - Supraganglionic infarction (M4-M6 cortex): 3 Total score (0-10 with 10 being normal): 4 IMPRESSION: 1. 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 2. ASPECTS is 4. 3. 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 These results were communicated to Dr. Laurence Slate at 7:29 pmon 02/14/2020 by text page via the [facility] messaging system. Electronically Signed   By: Sebastian Ache M.D.   On: 02/14/2020 19:30   VAS [place] UPPER EXTREMITY VENOUS DUPLEX  Result Date: 02/18/2020 UPPER VENOUS STUDY  Indications: Swelling Limitations: Patient positioning. Comparison Study: no prior Performing Technologist: Blanch Media RVS  Examination Guidelines: A complete evaluation includes B-mode imaging, spectral Doppler, color Doppler, and power Doppler as needed of all accessible portions of each vessel. Bilateral testing is considered an integral part of a  complete examination. Limited examinations for reoccurring indications may be performed as noted.  Right Findings: +----------+------------+---------+-----------+----------+--------------+ RIGHT     CompressiblePhasicitySpontaneousProperties   Summary     +----------+------------+---------+-----------+----------+--------------+ IJV  Not visualized +----------+------------+---------+-----------+----------+--------------+ Subclavian    Full       Yes       Yes                             +----------+------------+---------+-----------+----------+--------------+ Axillary      Full       Yes       Yes                             +----------+------------+---------+-----------+----------+--------------+ Brachial      Full       Yes       Yes                             +----------+------------+---------+-----------+----------+--------------+ Radial        Full                                                 +----------+------------+---------+-----------+----------+--------------+ Ulnar         Full                                                 +----------+------------+---------+-----------+----------+--------------+ Cephalic      Full                                                 +----------+------------+---------+-----------+----------+--------------+ Basilic       Full                                                 +----------+------------+---------+-----------+----------+--------------+  Summary:  Right: No evidence of deep vein thrombosis in the upper extremity. No evidence of superficial vein thrombosis in the upper extremity.  *See table(s) above for measurements and observations.  Diagnosing physician: Gretta Began MD Electronically signed by Gretta Began MD on 02/18/2020 at 6:41:36 PM.    Final    ECHOCARDIOGRAM LIMITED  Result Date: 02/15/2020    ECHOCARDIOGRAM LIMITED REPORT   Patient Name:   MAXIMILLION GILL Date of Exam: 02/15/2020 Medical Rec #:  [id number]      Height:       68.0 in Accession #:    [id number]     Weight:       152.1 lb Date of Birth:  1986-04-19       BSA:          1.819 m Patient Age:    33 years       BP:           139/86 mmHg Patient Gender: M              HR:           78 bpm. Exam Location:  Inpatient Procedure: Limited Echo, Limited Color Doppler and Cardiac Doppler Indications:    Stroke 434.91 / V761.9  Endocarditis I38  History:        Patient has no prior history of Echocardiogram examinations.                 HIV.  Sonographer:    Leta Jungling RDCS Referring Phys: [id number] TIMOTHY S OPYD IMPRESSIONS  1. Left ventricular ejection fraction, by estimation, is 55 to 60%. The left ventricle has normal function. The left ventricle has no regional wall motion abnormalities. Left ventricular diastolic parameters were normal.  2. The mitral valve is grossly normal. Trivial mitral valve regurgitation. No evidence of mitral stenosis.  3. The aortic valve is tricuspid. Aortic valve regurgitation is not visualized. No aortic stenosis is present.  4. There is normal pulmonary artery systolic pressure. The estimated right ventricular systolic pressure is 24.0 mmHg.  5. The inferior vena cava is normal in size with greater than 50% respiratory variability, suggesting right atrial pressure of 3 mmHg. Conclusion(s)/Recommendation(s): No evidence of valvular vegetations on this transthoracic echocardiogram. Would recommend a transesophageal echocardiogram to exclude infective endocarditis if clinically indicated. FINDINGS  Left Ventricle: Left ventricular ejection fraction, by estimation, is 55 to 60%. The left ventricle has normal function. The left ventricle has no regional wall motion abnormalities. The left ventricular internal cavity size was normal in size. There is  no left ventricular hypertrophy. Right Ventricle: There is normal pulmonary artery systolic pressure. The tricuspid  regurgitant velocity is 2.29 m/s, and with an assumed right atrial pressure of 3 mmHg, the estimated right ventricular systolic pressure is 24.0 mmHg. Left Atrium: Left atrial size was normal in size. Right Atrium: Right atrial size was normal in size. Pericardium: A small pericardial effusion is present. The pericardial effusion is circumferential. Mitral Valve: The mitral valve is grossly normal. Trivial mitral valve regurgitation. No evidence of mitral valve stenosis. There is no evidence of mitral valve vegetation. Tricuspid Valve: The tricuspid valve is grossly normal. Tricuspid valve regurgitation is trivial. No evidence of tricuspid stenosis. There is no evidence of tricuspid valve vegetation. Aortic Valve: The aortic valve is tricuspid. Aortic valve regurgitation is not visualized. No aortic stenosis is present. There is no evidence of aortic valve vegetation. Pulmonic Valve: The pulmonic valve was grossly normal. Pulmonic valve regurgitation is trivial. No evidence of pulmonic stenosis. There is no evidence of pulmonic valve vegetation. Venous: The inferior vena cava is normal in size with greater than 50% respiratory variability, suggesting right atrial pressure of 3 mmHg. IAS/Shunts: The atrial septum is grossly normal.  LEFT VENTRICLE PLAX 2D LVIDd:         5.00 cm      Diastology LVIDs:         3.50 cm      LV e' lateral:   12.70 cm/s LV PW:         1.00 cm      LV E/e' lateral: 5.0 LV IVS:        1.00 cm      LV e' medial:    12.50 cm/s                             LV E/e' medial:  5.1  LV Volumes (MOD) LV vol d, MOD A2C: 151.0 ml LV vol d, MOD A4C: 177.0 ml LV vol s, MOD A2C: 73.1 ml LV vol s, MOD A4C: 68.9 ml LV SV MOD A2C:     77.9 ml LV SV MOD A4C:  177.0 ml LV SV MOD BP:      94.8 ml LEFT ATRIUM             Index       RIGHT ATRIUM           Index LA diam:        3.30 cm 1.81 cm/m  RA Area:     14.30 cm LA Vol (A2C):   54.3 ml 29.85 ml/m RA Volume:   36.30 ml  19.95 ml/m LA Vol (A4C):   44.8  ml 24.62 ml/m LA Biplane Vol: 53.9 ml 29.63 ml/m  AORTIC VALVE LVOT Vmax:   104.00 cm/s LVOT Vmean:  72.900 cm/s LVOT VTI:    0.168 m  AORTA Ao Root diam: 3.50 cm MITRAL VALVE               TRICUSPID VALVE MV Area (PHT): 2.66 cm    TR Peak grad:   21.0 mmHg MV Decel Time: 285 msec    TR Vmax:        229.00 cm/s MV E velocity: 63.40 cm/s MV A velocity: 75.80 cm/s  SHUNTS MV E/A ratio:  0.84        Systemic VTI: 0.17 m Lennie Odor MD Electronically signed by Lennie Odor MD Signature Date/Time: 02/15/2020/1:27:05 PM    Final

## 2020-02-22 NOTE — Consult Note (Signed)
Consultation Note Date: 02/22/2020   Patient Name: Tyler Vargas  DOB: 15-Jul-1986  MRN: [id number]  Age / Sex: 34 y.o., adult  Vargas: Tyler Vargas Per Referring Physician: Jonetta Osgood, MD  Reason for Consultation: Establishing goals of care  HPI/Patient Profile: 34 y.o. adult    admitted on 02/14/2020     Clinical Assessment and Goals of Care: Patient is 7 years old.  Lives at home with roommate.  Past medical history of HIV, syphilis, cocaine use.  Patient admitted with weakness/aphasia found to have acute right MCA infarct.  Hospital course complicated by persistent encephalopathy, COVID-19 pneumonia and staph bacteremia.  Infectious disease specialist following.  Hospital course also significant for patient requiring core track tube and tube feedings.  A palliative medicine consultation has been requested for goals of care discussions with family.  Discussed with bedside RN.  Discussed with TRH MD.  Chart reviewed thoroughly.  Patient remains admitted to hospital medicine service with sequelae of acute CVA.  He has ongoing dysphagia.  He has undergone complete treatment for COVID-19 viral pneumonia, status post completion of course of steroids as well as remdesivir.  Hospital course remained significant for the fact that the patient has ongoing encephalopathy, remains on NG tube feedings.  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  Patient has a life threatening serious and critical illness as mentioned above.  He has ongoing decline in functional status as well as ongoing cognitive decline.  Is now on artificial nutrition and hydration with NG tube feedings and IV.  Patient has high risk of ongoing decline decompensation and even death.  If continuation of current measures is desired, patient would require PEG tube  placement and long-term care facility.  He may have a prolonged period of decline and ongoing decompensation, recurrent hospitalizations.  In light of multiple serious and potentially irreversible conditions as mentioned above, in my opinion, it would be appropriate to consider a mode of care that focuses more exclusively on aggressive symptom management/comfort measures.  This would involve comfort feedings PO with minimal sips of clear liquids when patient is most awake/alert realizing the risk of aspiration, discontinuation of artificial nutrition such as NG tube feedings, discontinuation of ancillary blood work and/or any measures not directly contributing to comfort care, addition of judicious use of opioids and benzodiazepines, addition of hospice as an extra layer of support and potentially considering residential hospice towards the end of this hospitalization.  Attempts being made to reach the patient's father for continuing these serious and difficult conversations.  See below.  PMT to follow.   NEXT OF KIN  father.   SUMMARY OF RECOMMENDATIONS   Agree with DNR Continue with current mode of care for now.  Call placed, unable to reach patient's father Mr Tyler Vargas, await call back and/or will re attempt in am on 02-23-2020 to further discuss goals of care, in relation to artificial nutrition and hydration, disposition and broad goals of care decisions based on patient's current  serious and critical illness. Further recommendations to follow. See above.  Thank you for the consult.   Code Status/Advance Care Planning:  DNR    Symptom Management:    as above, med history noted.   Palliative Prophylaxis:   Delirium Protocol     Psycho-social/Spiritual:   Desire for further Chaplaincy support:yes  Additional Recommendations: Caregiving  Support/Resources  Prognosis:   Guarded   Discharge Planning: To Be Determined      Primary Diagnoses: Present on Admission: . Acute  CVA (cerebrovascular accident) (HCC) . AIDS (acquired immune deficiency syndrome) (HCC) . History of syphilis . Cocaine use disorder, mild, abuse (HCC) . COVID-19 . (Resolved) Fever   I have reviewed the medical record, interviewed the patient and family, and examined the patient. The following aspects are pertinent.  Past Medical History:  Diagnosis Date  . History of syphilis 09/30/2019  . HIV (human immunodeficiency virus infection) (HCC)   . Male-to-male transgender person    Social History   Socioeconomic History  . Marital status: Single    Spouse name: Not on file  . Number of children: Not on file  . Years of education: Not on file  . Highest education level: Not on file  Occupational History  . Not on file  Tobacco Use  . Smoking status: Current Every Day Smoker    Packs/day: 0.25    Types: Cigarettes  . Smokeless tobacco: Never Used  . Tobacco comment: cutting back  Substance and Sexual Activity  . Alcohol use: Yes    Comment: every 2-3 days; beer, vodka  . Drug use: Yes    Types: Marijuana, Cocaine    Comment: cocaine past weekend (2 times a week), Marijuana 3-4 times a week  . Sexual activity: Not Currently    Partners: Male    Comment: offered condoms 10/2019  Other Topics Concern  . Not on file  Social History Narrative   ** Merged History Encounter **       Social Determinants of Health   Financial Resource Strain:   . Difficulty of Paying Living Expenses:   Food Insecurity:   . Worried About Programme researcher, broadcasting/film/video in the Last Year:   . Barista in the Last Year:   Transportation Needs:   . Freight forwarder (Medical):   Marland Kitchen Lack of Transportation (Non-Medical):   Physical Activity:   . Days of Exercise per Week:   . Minutes of Exercise per Session:   Stress:   . Feeling of Stress :   Social Connections:   . Frequency of Communication with Friends and Family:   . Frequency of Social Gatherings with Friends and Family:   .  Attends Religious Services:   . Active Member of Clubs or Organizations:   . Attends Banker Meetings:   Marland Kitchen Marital Status:    Family History  Problem Relation Age of Onset  . Cancer Neg Hx   . Diabetes Neg Hx    Scheduled Meds: .  stroke: mapping our early stages of recovery book   Does not apply Once  . abacavir-dolutegravir-lamiVUDine  1 tablet Oral Daily  . aspirin  325 mg Per NG tube Daily  . enoxaparin (LOVENOX) injection  40 mg Subcutaneous Q24H  . feeding supplement (PRO-STAT SUGAR FREE 64)  30 mL Per Tube BID  . influenza vac split quadrivalent PF  0.5 mL Intramuscular Tomorrow-1000  . pneumococcal 23 valent vaccine  0.5 mL Intramuscular Tomorrow-1000  . sulfamethoxazole-trimethoprim  20 mL Per Tube Daily   Continuous Infusions: . dextrose 5% lactated ringers 10 mL/hr at 02/21/20 0400  . feeding supplement (OSMOLITE 1.5 CAL) 1,000 mL (02/22/20 1037)  . vancomycin 1,000 mg (02/22/20 1018)   PRN Meds:.acetaminophen **OR** acetaminophen (TYLENOL) oral liquid 160 mg/5 mL **OR** acetaminophen, senna-docusate Medications Prior to Admission:  Prior to Admission medications   Medication Sig Start Date End Date Taking? Authorizing Provider  bictegravir-emtricitabine-tenofovir AF (BIKTARVY) 50-200-25 MG TABS tablet Take 1 tablet by mouth daily. Try to take at the same time each day with or without food. 01/28/20  Yes Blanchard Kelch, NP  ibuprofen (ADVIL) 200 MG tablet Take 200-400 mg by mouth every 6 (six) hours as needed for headache (pain).   Yes [provider]  naproxen sodium (ALEVE) 220 MG tablet Take 220-440 mg by mouth 2 (two) times daily as needed (pain/headache).   Yes [provider]  bictegravir-emtricitabine-tenofovir AF (BIKTARVY) 50-200-25 MG TABS tablet Take 1 tablet by mouth daily. 02/17/20 03/18/20  Arrien, York Ram, MD  sulfamethoxazole-trimethoprim (BACTRIM DS) 800-160 MG tablet Take 1 tablet by mouth daily. 01/28/20   Blanchard Kelch, NP   No Known Allergies Review of Systems No verbal Physical Exam Discussed with nursing staff, appreciate their diligent care of the patient. Patient noted to be not alert, tracks at times, inconsistently follows some commands, non verbal, not awake not alert. On Room Air, not on pressors. Does not have skin breakdown, has cortrak tube feeds, doesn't appear to have non verbal gestures of distress or discomfort, not described to have wincing, grimacing, or restless movements.  The above conversation was completed via face to face communication with nursing staff and phone discussion with TRH MD, due to the visitor restrictions during the COVID-19 pandemic. Thorough chart review and discussion with necessary members of the care team was completed as part of assessment. All issues were discussed and addressed but no physical exam was performed.  Vital Signs: BP 112/77 (BP Location: Left Arm)   Pulse (!) 102   Temp 98.3 F (36.8 C) (Axillary)   Resp 18   Ht 5\' 8"  (1.727 m)   Wt 64.8 kg   SpO2 95%   BMI 21.72 kg/m  Pain Scale: Faces POSS *See Group Information*: S-Acceptable,Sleep, easy to arouse Pain Score: 0-No pain   SpO2: SpO2: 95 % O2 Device:SpO2: 95 % O2 Flow Rate: .   IO: Intake/output summary:   Intake/Output Summary (Last 24 hours) at 02/22/2020 1506 Last data filed at 02/22/2020 0400 Gross per 24 hour  Intake 1737.73 ml  Output 900 ml  Net 837.73 ml    LBM: Last BM Date: 02/19/20 Baseline Weight: Weight: 67.7 kg Most recent weight: Weight: 64.8 kg     Palliative Assessment/Data:   PPS 30%  Time In:  1400 Time Out:  1500 Time Total:   60  Greater than 50%  of this time was spent counseling and coordinating care related to the above assessment and plan.  Signed by: 04/20/20, MD   Please contact Palliative Medicine Team phone at [phone] for questions and concerns.  For individual provider: See [phone]

## 2020-02-23 DIAGNOSIS — Z515 Encounter for palliative care: Secondary | ICD-10-CM

## 2020-02-23 DIAGNOSIS — R531 Weakness: Secondary | ICD-10-CM

## 2020-02-23 LAB — CULTURE, BLOOD (ROUTINE X 2)
Special Requests: ADEQUATE
Special Requests: ADEQUATE

## 2020-02-23 LAB — CBC
HCT: 37.5 % — ABNORMAL LOW (ref 39.0–52.0)
Hemoglobin: 12.2 g/dL — ABNORMAL LOW (ref 13.0–17.0)
MCH: 29.5 pg (ref 26.0–34.0)
MCHC: 32.5 g/dL (ref 30.0–36.0)
MCV: 90.6 fL (ref 80.0–100.0)
Platelets: 536 10*3/uL — ABNORMAL HIGH (ref 150–400)
RBC: 4.14 MIL/uL — ABNORMAL LOW (ref 4.22–5.81)
RDW: 15.8 % — ABNORMAL HIGH (ref 11.5–15.5)
WBC: 5.5 10*3/uL (ref 4.0–10.5)
nRBC: 0 % (ref 0.0–0.2)

## 2020-02-23 LAB — COMPREHENSIVE METABOLIC PANEL
ALT: 28 U/L (ref 0–44)
AST: 64 U/L — ABNORMAL HIGH (ref 15–41)
Albumin: 2.3 g/dL — ABNORMAL LOW (ref 3.5–5.0)
Alkaline Phosphatase: 69 U/L (ref 38–126)
Anion gap: 12 (ref 5–15)
BUN: 20 mg/dL (ref 6–20)
CO2: 22 mmol/L (ref 22–32)
Calcium: 8.8 mg/dL — ABNORMAL LOW (ref 8.9–10.3)
Chloride: 95 mmol/L — ABNORMAL LOW (ref 98–111)
Creatinine, Ser: 0.82 mg/dL (ref 0.61–1.24)
GFR calc Af Amer: >60 mL/min (ref >60)
GFR calc non Af Amer: >60 mL/min (ref >60)
Glucose, Bld: 133 mg/dL — ABNORMAL HIGH (ref 70–99)
Potassium: 4.4 mmol/L (ref 3.5–5.1)
Sodium: 129 mmol/L — ABNORMAL LOW (ref 135–145)
Total Bilirubin: 0.3 mg/dL (ref 0.3–1.2)
Total Protein: 9.4 g/dL — ABNORMAL HIGH (ref 6.5–8.1)

## 2020-02-23 LAB — GLUCOSE, CAPILLARY
Glucose-Capillary: 115 mg/dL — ABNORMAL HIGH (ref 70–99)
Glucose-Capillary: 116 mg/dL — ABNORMAL HIGH (ref 70–99)
Glucose-Capillary: 123 mg/dL — ABNORMAL HIGH (ref 70–99)
Glucose-Capillary: 131 mg/dL — ABNORMAL HIGH (ref 70–99)
Glucose-Capillary: 131 mg/dL — ABNORMAL HIGH (ref 70–99)
Glucose-Capillary: 147 mg/dL — ABNORMAL HIGH (ref 70–99)

## 2020-02-23 NOTE — Progress Notes (Addendum)
  Speech Language Pathology Treatment: Dysphagia;Cognitive-Linquistic  Patient Details Name: Tyler Vargas MRN: [id number] DOB: [birth year] Today's Date: 02/23/2020 Time: 1962-2297 SLP Time Calculation (min) (ACUTE ONLY): 11 min  Assessment / Plan / Recommendation Clinical Impression  Unfortunately Tyler Vargas's swallow status has not improved from initial swallow assessment. Evidence of poor saliva management with max excessive secretion from left oral cavity (turned to left side). Compared to 3/30, no attempts to accept bolus. She was unable to intentionally depress mandible or move lips. Mild labial movement on left in response to ice chip sensation x 1. Reflexive swallow present once. No vocalizations present. She was unable to manage secretions and Tyler Vargas should continue NPO status. Palliative care on board and pending discussion with family per MD. Will follow once more for decision and possible family education needed.    HPI HPI: 34 y.o. adult with medical history significant for HIV/AIDS with CD4 count 84 and viral load 1.8 million earlier this month, latent syphilis, and cocaine abuse, now presenting to the emergency department with weakness. Pt aphasic and initially not following commands in ED, eventually able to follow commands but not provide history.  CT head is concerning for acute right MCA infarctions involving the frontal lobe, insula, and basal ganglia with small focus of hemorrhage in the right frontal operculum.  Also noted on CT is a new age-indeterminate lacunar infarction in the left internal capsule. Pt also found to be COVID +. Admitted 3/27 for treatment of acute CVA.      SLP Plan  Continue with current plan of care       Recommendations  Diet recommendations: NPO                Oral Care Recommendations: Oral care QID Follow up Recommendations: Skilled Nursing facility SLP Visit Diagnosis: Cognitive communication deficit (R41.841);Dysphagia, oral phase  (R13.11) Plan: Continue with current plan of care                       Tyler Vargas 02/23/2020, 2:31 PM   Tyler Vargas.Ed Nurse, children's [phone] Office [phone]

## 2020-02-23 NOTE — Progress Notes (Signed)
PHARMACY - PHYSICIAN COMMUNICATION CRITICAL VALUE ALERT - BLOOD CULTURE IDENTIFICATION (BCID)  Tyler Vargas is an 34 y.o. 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 Repeat cultures drawn 4/4 have GPC (no BCID yet, but likely same organism)  Name of physician (or Provider) Contacted: [facility] ([facility])  Current antibiotics: Vancomycin  Changes to prescribed antibiotics recommended:  Cont current regimen   Results for orders placed or performed during the hospital encounter of 02/14/20  Blood Culture ID Panel (Reflexed) (Collected: 02/19/2020  7:39 AM)  Result Value Ref Range   Enterococcus species NOT DETECTED NOT DETECTED   Listeria monocytogenes NOT DETECTED NOT DETECTED   Staphylococcus species DETECTED (A) NOT DETECTED   Staphylococcus aureus (BCID) NOT DETECTED NOT DETECTED   Methicillin resistance DETECTED (A) NOT DETECTED   Streptococcus species NOT DETECTED NOT DETECTED   Streptococcus agalactiae NOT DETECTED NOT DETECTED   Streptococcus pneumoniae NOT DETECTED NOT DETECTED   Streptococcus pyogenes NOT DETECTED NOT DETECTED   Acinetobacter baumannii NOT DETECTED NOT DETECTED   Enterobacteriaceae species NOT DETECTED NOT DETECTED   Enterobacter cloacae complex NOT DETECTED NOT DETECTED   Escherichia coli NOT DETECTED NOT DETECTED   Klebsiella oxytoca NOT DETECTED NOT DETECTED   Klebsiella pneumoniae NOT DETECTED NOT DETECTED   Proteus species NOT DETECTED NOT DETECTED   Serratia marcescens NOT DETECTED NOT DETECTED   Haemophilus influenzae NOT DETECTED NOT DETECTED   Neisseria meningitidis NOT DETECTED NOT DETECTED   Pseudomonas aeruginosa NOT DETECTED NOT DETECTED   Candida albicans NOT DETECTED NOT DETECTED   Candida glabrata NOT DETECTED NOT DETECTED   Candida krusei NOT DETECTED NOT DETECTED   Candida parapsilosis NOT DETECTED NOT DETECTED   Candida tropicalis NOT DETECTED NOT DETECTED     [name] 02/23/2020  4:57 AM

## 2020-02-23 NOTE — Plan of Care (Signed)
  Problem: Education: Goal: Knowledge of General Education information will improve Description: Including pain rating scale, medication(s)/side effects and non-pharmacologic comfort measures Outcome: Not Progressing Note: No progress being made towards goals; patient is non-verbal.  Unable to assess education.   Problem: Health Behavior/Discharge Planning: Goal: Ability to manage health-related needs will improve Outcome: Not Progressing   Problem: Clinical Measurements: Goal: Ability to maintain clinical measurements within normal limits will improve Outcome: Progressing Note: No progress being made towards goals; patient is non-verbal.  Unable to assess education. Goal: Will remain free from infection Outcome: Progressing Goal: Diagnostic test results will improve Outcome: Progressing Goal: Respiratory complications will improve Outcome: Progressing Goal: Cardiovascular complication will be avoided Outcome: Progressing   Problem: Activity: Goal: Risk for activity intolerance will decrease Outcome: Not Progressing Note: Patient is unable to ambulate   Problem: Nutrition: Goal: Adequate nutrition will be maintained Outcome: Progressing   Problem: Coping: Goal: Level of anxiety will decrease Outcome: Progressing   Problem: Elimination: Goal: Will not experience complications related to bowel motility Outcome: Not Progressing Note: Patient has not had documented bowel movement in 72 hours, PRN stool softener given during shift Goal: Will not experience complications related to urinary retention Outcome: Progressing   Problem: Pain Managment: Goal: General experience of comfort will improve Outcome: Progressing   Problem: Safety: Goal: Ability to remain free from injury will improve Outcome: Progressing   Problem: Skin Integrity: Goal: Risk for impaired skin integrity will decrease Outcome: Progressing   Problem: Education: Goal: Knowledge of disease or condition  will improve Outcome: Progressing Goal: Knowledge of secondary prevention will improve Outcome: Progressing   Problem: Coping: Goal: Will verbalize positive feelings about self Outcome: Not Progressing Note: No progress being made towards goals; patient is non-verbal.  Unable to assess education. Goal: Will identify appropriate support needs Outcome: Not Progressing Note: No progress being made towards goals; patient is non-verbal.  Unable to assess education.   Problem: Health Behavior/Discharge Planning: Goal: Ability to manage health-related needs will improve Outcome: Not Progressing Note: No progress being made towards goals; patient is non-verbal.  Unable to assess education.

## 2020-02-23 NOTE — Progress Notes (Signed)
PROGRESS NOTE                                                                                                                                                                                                             Patient Demographics:    Tyler Vargas, is a 34 y.o. adult, DOB - 1986/02/05, JYN:[id number]  Outpatient Primary MD for the patient is Patient, No Pcp Per   Admit date - 02/14/2020   LOS - 9  Chief Complaint  Patient presents with  . Code Stroke       Brief Narrative: Patient is a 34 y.o. 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  Hospital course complicated by persistent encephalopathy, COVID-19 pneumonia, and Staph bacteremia.  See below for further details.  Significant Events: 3/27>> admit to [facility] for acute CVA. 3/28>> EEG negative for seizures 3/28>> EF 55-60%, no obvious valvular abnormalities. 3/31>>cortak tube inserted 4/1>> Recurrent fever>>Blood cultures-Prelim MRSA 4/1>> EEG negative for seizures 4/1>> CT head negative for acute abnormalities 4/3>>d/w patients father-DNR  COVID-19 medications: Steroids: 3/28>>4/2 Remdesivir: 3/28>>4/1  Antibiotics: Vancomycin 4/3>> Penicillin G: 3/27>> 3/28  Microbiology data: 4/4>> blood culture: Pending 4/1>>blood culture:staph simulans 3/27>> Blood culture: negative 3/27>> CSF culture: Negative 3/27>> urine culture: Multiple morphotypes 3/27>> VDRL CSF: Nonreactive 3/27>> CSF cryptococcal antigen negative  DVT prophylaxis: SQ Lovenox  Procedures: 3/27 >>lumbar puncture: By ED MD  Consults: ID Neuro Palliative care    Subjective:   A bit more alert-tracking my movement.  But really does not   Assessment  & Plan :   Acute CVA: Remains aphasic-exam is difficult because of encephalopathy.  He has obvious left-sided weakness-but is  not moving his right side as well-but picture is clouded by encephalopathy.  Repeat CT head on 4/1 without any new findings.  Spot EEG on 4/1 without obvious seizure-like activity.  Extensive stroke work-up/neurology evaluation completed-CTA neck without any significant extracranial stenosis, echo without any obvious embolic source, transcranial Doppler negative for PFO.  Given ongoing encephalopathy-repeat CT head 4/1 was negative for acute abnormalities.  This MD spoke with Dr. 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  He is still very aphasic-encephalopathy is waxing and waning-somewhat better today.  He really is not following commands-I suspect he will require a PEG tube.  Given his other numerous issues-his prognosis was poor-I have consulted palliative care for further delineation of goals of care before we proceed with the PEG tube placement.  Will ask SLP to follow to see if dysphagia has improved any.  Covid 19 Viral pneumonia: Remains stable on room air-has completed a course of steroids/remdesivir.  Fever: afebrile  O2 requirements:  SpO2: 94 %   COVID-19 Labs: Recent Labs    02/21/20 0343  FERRITIN 843*  CRP 2.0*    No results found for: BNP  No results for input(s): PROCALCITON in the last 168 hours.  Lab Results  Component Value Date   SARSCOV2NAA POSITIVE (A) 02/14/2020    ?  Aseptic meningitis: CSF with elevated protein levels-and slight leukocytosis-mostly lymphocytes.  CSF cultures negative-VDRL CSF negative-CSF cryptococcal antigen negative.  Remains with waxing and waning encephalopathy-slightly better today.  Coagulase-negative Staph bacteremia: Had persistent fever on 4/1-blood cultures done on 4/1-positive for 2 different varieties of coagulase-negative  Staphylococcus.  Thought to be secondary to IV infiltration from the right arm.  Appreciate ID input-continue vancomycin.  Will need to repeat blood culture at some point again.  Acute metabolic encephalopathy: Waxing and waning-slightly more awake today but really not following commands.  Repeat CT head on 4/1 without any acute findings.  EEG on 4/1 without seizures. Suspect etiology related to encephalitis/aseptic meningitis related to HIV.  Note CSF VDRL and CSF cryptococcal antigen negative.  Continue supportive care.    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  History of syphilis: CSF VDRL negative-not likely to have neurosyphilis as the cause of CVA.  No longer on penicillin G.   Cocaine use: We will counsel when more awake and alert  Right upper extremity swelling: Likely secondary to infiltrated IV-Doppler ultrasound on 3/31 neg for DVT.  Goals of care: Long discussion with father again on 4/3-patient is not married-does not have any children-father was already aware of patient's HIV diagnoses from patient's sister who learned about the diagnosis from patient's roommate.  He is aware of the patient's overall tenuous condition-and underlying serious medical issues as outlined above-he is really appreciative of our efforts to take care of his son.  He understands the critical nature of the situation-and that there is potential for further decline resulting in death during this hospital stay.  He also understands that if the patient does survive-patient may require PEG tube-and may have significant long-term neurological sequelae from his encephalitis/CVA.  I have consulted palliative care-who is talking with the patient's father-we will await further recommendations.  Nutrition Problem: Nutrition Problem: Increased nutrient needs Etiology: chronic illness(HIV/AIDS) Signs/Symptoms: estimated needs Interventions: Tube feeding,  Prostat  ABG:    Component Value Date/Time   PHART 7.254 (L) 03/07/2014 1019   PCO2ART 40.7 03/07/2014 1019   PO2ART 107.0 (H) 03/07/2014 1019   HCO3 18.0 (L) 03/07/2014 1019   TCO2 27 02/14/2020 1911   ACIDBASEDEF 9.0 (H) 03/07/2014 1019   O2SAT 97.0 03/07/2014 1019    Vent Settings: N/A  Condition -extremely guarded  Family Communication  : Spoke at length with father-on 4/5  Code Status :  DNR  Diet :  Diet Order            Diet NPO time specified  Diet effective now  Disposition Plan  :  Remain hospitalized  Barriers to discharge: CVA-aphasic-dysphagia-ongoing encephalopathy-fever  Antimicorbials  :    Anti-infectives (From admission, onward)   Start     Dose/Rate Route Frequency Ordered Stop   02/21/20 0900  vancomycin (VANCOCIN) IVPB 1000 mg/200 mL premix     1,000 mg 200 mL/hr over 60 Minutes Intravenous Every 12 hours 02/20/20 2000     02/20/20 2030  vancomycin (VANCOREADY) IVPB 1250 mg/250 mL     1,250 mg 166.7 mL/hr over 90 Minutes Intravenous  Once 02/20/20 1956 02/20/20 2156   02/19/20 1400  abacavir-dolutegravir-lamiVUDine (TRIUMEQ) 600-50-300 MG per tablet 1 tablet     1 tablet Oral Daily 02/19/20 1358     02/19/20 1000  sulfamethoxazole-trimethoprim (BACTRIM) 200-40 MG/5ML suspension 20 mL     20 mL Per Tube Daily 02/19/20 0901     02/17/20 0000  bictegravir-emtricitabine-tenofovir AF (BIKTARVY) 50-200-25 MG TABS tablet     1 tablet Oral Daily 02/17/20 1250 03/18/20 2359   02/16/20 1000  remdesivir 100 mg in sodium chloride 0.9 % 100 mL IVPB     100 mg 200 mL/hr over 30 Minutes Intravenous Daily 02/15/20 1016 02/19/20 0858   02/15/20 1100  remdesivir 200 mg in sodium chloride 0.9% 250 mL IVPB     200 mg 580 mL/hr over 30 Minutes Intravenous Once 02/15/20 1016 02/15/20 1150   02/15/20 1000  bictegravir-emtricitabine-tenofovir AF (BIKTARVY) 50-200-25 MG per tablet 1 tablet  Status:  Discontinued    Note to Pharmacy: Try to take at  the same time each day with or without food.     1 tablet Oral Daily 02/14/20 2231 02/19/20 1358   02/15/20 1000  sulfamethoxazole-trimethoprim (BACTRIM DS) 800-160 MG per tablet 1 tablet  Status:  Discontinued     1 tablet Oral Daily 02/14/20 2231 02/19/20 0901   02/15/20 0530  vancomycin (VANCOCIN) IVPB 1000 mg/200 mL premix  Status:  Discontinued     1,000 mg 200 mL/hr over 60 Minutes Intravenous Every 8 hours 02/14/20 2120 02/15/20 0957   02/14/20 2215  penicillin G potassium 4 Million Units in dextrose 5 % 250 mL IVPB  Status:  Discontinued     4 Million Units 250 mL/hr over 60 Minutes Intravenous Every 4 hours 02/14/20 2202 02/15/20 1653   02/14/20 2200  ceFEPIme (MAXIPIME) 2 g in sodium chloride 0.9 % 100 mL IVPB  Status:  Discontinued     2 g 200 mL/hr over 30 Minutes Intravenous Every 8 hours 02/14/20 2120 02/14/20 2145   02/14/20 2130  vancomycin (VANCOREADY) IVPB 1500 mg/300 mL     1,500 mg 150 mL/hr over 120 Minutes Intravenous  Once 02/14/20 2120 02/15/20 0044   02/14/20 2115  ceFEPIme (MAXIPIME) 2 g in sodium chloride 0.9 % 100 mL IVPB  Status:  Discontinued     2 g 200 mL/hr over 30 Minutes Intravenous  Once 02/14/20 2103 02/14/20 2120   02/14/20 2115  metroNIDAZOLE (FLAGYL) IVPB 500 mg  Status:  Discontinued     500 mg 100 mL/hr over 60 Minutes Intravenous  Once 02/14/20 2103 02/14/20 2120   02/14/20 2115  vancomycin (VANCOCIN) IVPB 1000 mg/200 mL premix  Status:  Discontinued     1,000 mg 200 mL/hr over 60 Minutes Intravenous  Once 02/14/20 2103 02/14/20 2120      Inpatient Medications  Scheduled Meds: .  stroke: mapping our early stages of recovery book   Does not apply Once  . abacavir-dolutegravir-lamiVUDine  1 tablet Oral Daily  .  aspirin  325 mg Per NG tube Daily  . enoxaparin (LOVENOX) injection  40 mg Subcutaneous Q24H  . feeding supplement (PRO-STAT SUGAR FREE 64)  30 mL Per Tube BID  . influenza vac split quadrivalent PF  0.5 mL Intramuscular  Tomorrow-1000  . pneumococcal 23 valent vaccine  0.5 mL Intramuscular Tomorrow-1000  . sulfamethoxazole-trimethoprim  20 mL Per Tube Daily   Continuous Infusions: . dextrose 5% lactated ringers 10 mL/hr at 02/21/20 0400  . feeding supplement (OSMOLITE 1.5 CAL) 1,000 mL (02/22/20 1037)  . vancomycin 1,000 mg (02/23/20 1054)   PRN Meds:.acetaminophen **OR** acetaminophen (TYLENOL) oral liquid 160 mg/5 mL **OR** acetaminophen, senna-docusate   Time Spent in minutes  35  See all Orders from today for further details   Jeoffrey Massed M.D on 02/23/2020 at 11:32 AM  To page go to [email or web address] - use universal password  [facility] -  Office  [phone]    Objective:   Vitals:   02/22/20 1934 02/23/20 0000 02/23/20 0500 02/23/20 0730  BP:  109/73 114/90 108/72  Pulse:  85 99   Resp:  20 (!) 21   Temp: 98.9 F (37.2 C) 98.2 F (36.8 C) 98.1 F (36.7 C) 99 F (37.2 C)  TempSrc: Axillary Axillary Axillary Axillary  SpO2:  95% 94%   Weight:      Height:        Wt Readings from Last 3 Encounters:  02/21/20 64.8 kg  01/28/20 69.9 kg  10/30/19 57.2 kg     Intake/Output Summary (Last 24 hours) at 02/23/2020 1132 Last data filed at 02/23/2020 1027 Gross per 24 hour  Intake 1975 ml  Output 2300 ml  Net -325 ml     Physical Exam Gen Exam: A bit more awake today-tracks my movement-remains aphasic.  Not in any distress.  Does not follow any commands. HEENT:atraumatic, normocephalic Chest: B/L clear to auscultation anteriorly CVS:S1S2 regular Abdomen:soft non tender, non distended Extremities:no edema Neurology: Not following commands-encephalopathic-difficult to assess but moves lower extremities to pain. Skin: no rash   Data Review:    CBC Recent Labs  Lab 02/19/20 0450 02/19/20 1431 02/20/20 0541 02/21/20 0343 02/23/20 0253  WBC 8.2 12.7* 9.0 4.4 5.5  HGB 11.1* 11.3* 11.3* 11.7* 12.2*  HCT 34.6* 35.2* 35.8* 36.2* 37.5*  PLT 690* 710* 684* 581* 536*   MCV 90.8 91.7 92.7 90.7 90.6  MCH 29.1 29.4 29.3 29.3 29.5  MCHC 32.1 32.1 31.6 32.3 32.5  RDW 15.2 15.6* 15.7* 15.6* 15.8*  LYMPHSABS  --  0.7  --   --   --   MONOABS  --  0.2  --   --   --   EOSABS  --  0.0  --   --   --   BASOSABS  --  0.0  --   --   --     Chemistries  Recent Labs  Lab 02/18/20 0336 02/19/20 0450 02/20/20 0541 02/21/20 0343 02/23/20 0253  NA 136 136 135 135 129*  K 3.8 3.6 3.9 4.2 4.4  CL 103 101 100 101 95*  CO2 22 23 26 22 22   GLUCOSE 371* 136* 106* 107* 133*  BUN 12 13 16 19 20   CREATININE 0.75 0.82 0.98 0.90 0.82  CALCIUM 8.6* 8.4* 8.5* 8.8* 8.8*  AST 43* 60* 81* 55* 64*  ALT 18 29 32 31 28  ALKPHOS 41 57 72 71 69  BILITOT <0.1* 0.3 0.5 0.3 0.3   ------------------------------------------------------------------------------------------------------------------ No results for input(s): CHOL, HDL,  LDLCALC, TRIG, CHOLHDL, LDLDIRECT in the last 72 hours.  Lab Results  Component Value Date   HGBA1C 5.9 (H) 02/15/2020   ------------------------------------------------------------------------------------------------------------------ No results for input(s): TSH, T4TOTAL, T3FREE, THYROIDAB in the last 72 hours.  Invalid input(s): FREET3 ------------------------------------------------------------------------------------------------------------------ Recent Labs    02/21/20 0343  FERRITIN 843*    Coagulation profile No results for input(s): INR, PROTIME in the last 168 hours.  No results for input(s): DDIMER in the last 72 hours.  Cardiac Enzymes No results for input(s): CKMB, TROPONINI, MYOGLOBIN in the last 168 hours.  Invalid input(s): CK ------------------------------------------------------------------------------------------------------------------ No results found for: BNP  Micro Results Recent Results (from the past 240 hour(s))  Culture, blood (Routine X 2) w Reflex to ID Panel     Status: None   Collection Time: 02/14/20   9:40 PM   Specimen: BLOOD  Result Value Ref Range Status   Specimen Description BLOOD RIGHT ARM  Final   Special Requests   Final    BOTTLES DRAWN AEROBIC AND ANAEROBIC Blood Culture adequate volume   Culture   Final    NO GROWTH 5 DAYS Performed at [facility], [place] [address], [place], [place] [address]    Report Status 02/19/2020 FINAL  Final  Culture, blood (Routine X 2) w Reflex to ID Panel     Status: None   Collection Time: 02/14/20  9:46 PM   Specimen: BLOOD  Result Value Ref Range Status   Specimen Description BLOOD RIGHT FOREARM  Final   Special Requests   Final    BOTTLES DRAWN AEROBIC AND ANAEROBIC Blood Culture adequate volume   Culture   Final    NO GROWTH 5 DAYS Performed at [facility], [place] [address], [place], [place] [address]    Report Status 02/19/2020 FINAL  Final  Urine culture     Status: Abnormal   Collection Time: 02/14/20  9:46 PM   Specimen: In/Out Cath Urine  Result Value Ref Range Status   Specimen Description IN/OUT CATH URINE  Final   Special Requests   Final    NONE Performed at [facility], [place] [address], [place], [place] [address]    Culture MULTIPLE SPECIES PRESENT, SUGGEST RECOLLECTION (A)  Final   Report Status 02/16/2020 FINAL  Final  SARS CORONAVIRUS 2 (TAT 6-24 HRS)     Status: Abnormal   Collection Time: 02/14/20 10:55 PM  Result Value Ref Range Status   SARS Coronavirus 2 POSITIVE (A) NEGATIVE Final    Comment: RESULT CALLED TO, READ BACK BY AND VERIFIED WITH: K. GUY,RN 0335 02/15/2020 T. TYSOR (NOTE) SARS-CoV-2 target nucleic acids are DETECTED. The SARS-CoV-2 RNA is generally detectable in upper and lower respiratory specimens during the acute phase of infection. Positive results are indicative of the presence of SARS-CoV-2 RNA. Clinical correlation with patient history and other diagnostic information is  necessary to determine patient infection status. Positive results do not rule out bacterial infection  or co-infection with other viruses.  The expected result is Negative. Fact Sheet for Patients: SugarRoll.be Fact Sheet for Healthcare Providers: https://www.woods-mathews.com/ This test is not yet approved or cleared by the [place] FDA and  has been authorized for detection and/or diagnosis of SARS-CoV-2 by FDA under an Emergency Use Authorization (EUA). This EUA will remain  in effect (meaning this test can be used) for the  duration of the COVID-19 declaration under Section 564(b)(1) of the Act, 21 U.S.C. section 360bbb-3(b)(1), unless the authorization is terminated or  revoked sooner. Performed at [facility], [address] [place], [place] [address]   CSF culture with Stat gram stain     Status: None   Collection Time: 02/14/20 11:35 PM   Specimen: CSF; Cerebrospinal Fluid  Result Value Ref Range Status   Specimen Description CSF  Final   Special Requests NONE  Final   Gram Stain   Final    CYTOSPIN SMEAR WBC PRESENT,BOTH PMN AND MONONUCLEAR NO ORGANISMS SEEN    Culture   Final    NO GROWTH 3 DAYS Performed at [facility], [address], [place], [place] [address]    Report Status 02/18/2020 FINAL  Final  Culture, blood (routine x 2)     Status: Abnormal   Collection Time: 02/19/20  7:39 AM   Specimen: BLOOD  Result Value Ref Range Status   Specimen Description BLOOD RIGHT ANTECUBITAL  Final   Special Requests   Final    BOTTLES DRAWN AEROBIC AND ANAEROBIC Blood Culture adequate volume   Culture  Setup Time   Final    GRAM POSITIVE COCCI IN CLUSTERS IN BOTH AEROBIC AND ANAEROBIC BOTTLES Organism ID to follow CRITICAL RESULT CALLED TO, READ BACK BY AND VERIFIED WITHColin Rhein PHARMD 2148 02/20/20 A BROWNING Performed at [facility], [address], [place], [place] [address]    Culture (A)  Final    STAPHYLOCOCCUS SIMULANS CORRECTED ON 04/04 AT 1031: PREVIOUSLY REPORTED AS STAPHYLOCOCCUS SPECIES  (COAGULASE NEGATIVE)   Report Status 02/23/2020 FINAL  Final   Organism ID, Bacteria STAPHYLOCOCCUS SIMULANS  Final      Susceptibility   Staphylococcus simulans - MIC*    CIPROFLOXACIN <=0.5 SENSITIVE Sensitive     ERYTHROMYCIN >=8 RESISTANT Resistant     GENTAMICIN <=0.5 SENSITIVE Sensitive     OXACILLIN >=4 RESISTANT Resistant     TETRACYCLINE >=16 RESISTANT Resistant     VANCOMYCIN <=0.5 SENSITIVE Sensitive     TRIMETH/SULFA 160 RESISTANT Resistant     CLINDAMYCIN RESISTANT Resistant     RIFAMPIN <=0.5 SENSITIVE Sensitive     Inducible Clindamycin POSITIVE Resistant     * STAPHYLOCOCCUS SIMULANS CORRECTED ON 04/04 AT 1031: PREVIOUSLY REPORTED AS STAPHYLOCOCCUS SPECIES (COAGULASE NEGATIVE)  Blood Culture ID Panel (Reflexed)     Status: Abnormal   Collection Time: 02/19/20  7:39 AM  Result Value Ref Range Status   Enterococcus species NOT DETECTED NOT DETECTED Final   Listeria monocytogenes NOT DETECTED NOT DETECTED Final   Staphylococcus species DETECTED (A) NOT DETECTED Final    Comment: Methicillin (oxacillin) resistant coagulase negative staphylococcus. Possible blood culture contaminant (unless isolated from more than one blood culture draw or clinical case suggests pathogenicity). No antibiotic treatment is indicated for blood  culture contaminants. CRITICAL RESULT CALLED TO, READ BACK BY AND VERIFIED WITH: B MANCHERIL PHARMD 2148 02/20/20 A BROWNING    Staphylococcus aureus (BCID) NOT DETECTED NOT DETECTED Final   Methicillin resistance DETECTED (A) NOT DETECTED Final    Comment: CRITICAL RESULT CALLED TO, READ BACK BY AND VERIFIED WITH: B MANCHERIL PHARMD 2148 02/20/20 A BROWNING    Streptococcus species NOT DETECTED NOT DETECTED Final   Streptococcus agalactiae NOT DETECTED NOT DETECTED Final   Streptococcus pneumoniae NOT DETECTED NOT DETECTED Final   Streptococcus pyogenes NOT DETECTED NOT DETECTED Final   Acinetobacter baumannii NOT DETECTED NOT DETECTED Final    Enterobacteriaceae species NOT DETECTED NOT DETECTED Final   Enterobacter cloacae complex NOT DETECTED NOT DETECTED Final  Escherichia coli NOT DETECTED NOT DETECTED Final   Klebsiella oxytoca NOT DETECTED NOT DETECTED Final   Klebsiella pneumoniae NOT DETECTED NOT DETECTED Final   Proteus species NOT DETECTED NOT DETECTED Final   Serratia marcescens NOT DETECTED NOT DETECTED Final   Haemophilus influenzae NOT DETECTED NOT DETECTED Final   Neisseria meningitidis NOT DETECTED NOT DETECTED Final   Pseudomonas aeruginosa NOT DETECTED NOT DETECTED Final   Candida albicans NOT DETECTED NOT DETECTED Final   Candida glabrata NOT DETECTED NOT DETECTED Final   Candida krusei NOT DETECTED NOT DETECTED Final   Candida parapsilosis NOT DETECTED NOT DETECTED Final   Candida tropicalis NOT DETECTED NOT DETECTED Final    Comment: Performed at [facility], [address], [place], [place] [address]  Culture, blood (routine x 2)     Status: Abnormal   Collection Time: 02/19/20  7:47 AM   Specimen: BLOOD  Result Value Ref Range Status   Specimen Description BLOOD RIGHT ANTECUBITAL  Final   Special Requests   Final    BOTTLES DRAWN AEROBIC AND ANAEROBIC Blood Culture adequate volume   Culture  Setup Time   Final    GRAM POSITIVE COCCI IN CLUSTERS IN BOTH AEROBIC AND ANAEROBIC BOTTLES CRITICAL RESULT CALLED TO, READ BACK BY AND VERIFIED WITH: Janice Norrie, PHARMD AT 1908 ON 02/20/20 BY C. JESSUP, MT Performed at [facility], [address] [place], [place] [address]    Culture STAPHYLOCOCCUS EPIDERMIDIS (A)  Final   Report Status 02/23/2020 FINAL  Final   Organism ID, Bacteria STAPHYLOCOCCUS EPIDERMIDIS  Final      Susceptibility   Staphylococcus epidermidis - MIC*    CIPROFLOXACIN <=0.5 SENSITIVE Sensitive     ERYTHROMYCIN >=8 RESISTANT Resistant     GENTAMICIN <=0.5 SENSITIVE Sensitive     OXACILLIN 1 RESISTANT Resistant     TETRACYCLINE <=1 SENSITIVE Sensitive     VANCOMYCIN <=0.5  SENSITIVE Sensitive     TRIMETH/SULFA 20 SENSITIVE Sensitive     CLINDAMYCIN >=8 RESISTANT Resistant     RIFAMPIN <=0.5 SENSITIVE Sensitive     Inducible Clindamycin NEGATIVE Sensitive     * STAPHYLOCOCCUS EPIDERMIDIS  Culture, blood (routine x 2)     Status: None (Preliminary result)   Collection Time: 02/22/20  7:02 AM   Specimen: BLOOD  Result Value Ref Range Status   Specimen Description BLOOD LEFT ANTECUBITAL  Final   Special Requests   Final    BOTTLES DRAWN AEROBIC AND ANAEROBIC Blood Culture adequate volume   Culture  Setup Time   Final    GRAM POSITIVE COCCI IN BOTH AEROBIC AND ANAEROBIC BOTTLES CRITICAL RESULT CALLED TO, READ BACK BY AND VERIFIED WITH: PHRMD J LEDFORD @0456  02/23/20 BY S GEZAHEGN Performed at [facility], [address], [place], [place]    Culture PENDING  Incomplete   Report Status PENDING  Incomplete    Radiology Reports EEG  Result Date: 02/15/2020 02/17/2020, MD     02/15/2020  4:13 PM Patient Name: Tyler Vargas MRN: [facility]os Epilepsy Attending: [id number] Referring Physician/Provider: Dr Charlsie Quest A[place]or Date: 02/15/2020 Duration: 23.26 mins Patient history: 33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 EEG to evaluate for seizure Level of alertness: asleep AEDs during  EEG study: None Technical aspects: This EEG study was done with scalp electrodes positioned according to the 10-20 International system of electrode placement. Electrical activity was acquired at a sampling rate of  and reviewed with a high frequency filter of  and a low frequency filter of . EEG data were recorded continuously and digitally stored. DESCRIPTION:  Sleep was characterized by vertex waves, sleep  spindles, maximal frontocentral. Hyperventilation and photic stimulation were not performed. IMPRESSION: This study during sleep only is within normal limits. No seizures or epileptiform discharges were seen throughout the recording. If suspicion for interictal activity remains a concern, a prolonged study can be considered. Priyanka Annabelle Harman   CT ANGIO HEAD W OR WO CONTRAST  Result Date: 02/14/2020 CLINICAL DATA:  Stroke. Right-sided weakness and speech disturbance. EXAM: CT ANGIOGRAPHY HEAD AND NECK CT PERFUSION BRAIN TECHNIQUE: Multidetector CT imaging of the head and neck was performed using the standard protocol during bolus administration of intravenous contrast. Multiplanar CT image reconstructions and MIPs were obtained to evaluate the vascular anatomy. Carotid stenosis measurements (when applicable) are obtained utilizing NASCET criteria, using the distal internal carotid diameter as the denominator. Multiphase CT imaging of the brain was performed following IV bolus contrast injection. Subsequent parametric perfusion maps were calculated using RAPID software. CONTRAST:  40mL OMNIPAQUE IOHEXOL 350 MG/ML SOLN; 75mL OMNIPAQUE IOHEXOL 350 MG/ML SOLN COMPARISON:  None. FINDINGS: CTA NECK FINDINGS Aortic arch: Standard 3 vessel aortic arch with widely patent arch vessel origins. Right carotid system: Patent and smooth without evidence of stenosis or dissection. Left carotid system: Patent and smooth without evidence of stenosis or dissection. Vertebral arteries: Patent, smooth, and codominant without evidence of stenosis or dissection. Skeleton: Poor dentition with multiple periapical erosions. Other neck: Borderline enlarged level II lymph nodes bilaterally. Upper chest: Centrilobular emphysema. Small superior mediastinal lymph nodes. Review of the MIP images confirms the above findings CTA HEAD FINDINGS Anterior circulation: The internal carotid arteries are widely patent from skull base to carotid termini.  The A1 and M1 segments are widely patent with the left A1 segment being mildly hypoplastic. There is a decreased number of branch vessels in the right MCA superior division with suggestion of occlusion and intermittent reconstitution of distal M2 and M3 branches. There is also multifocal irregular narrowing of M3 branches in the right MCA inferior division, and there is mild left MCA branch vessel irregularity. No aneurysm is identified. Posterior circulation: The intracranial vertebral arteries are widely patent to the basilar. The basilar artery is widely patent. There are small posterior communicating arteries bilaterally. PCAs are patent with mild branch vessel irregularity more notable on the right without evidence of flow limiting proximal stenosis. No aneurysm is identified. Venous sinuses: Patent. Anatomic variants: None. Review of the MIP images confirms the above findings CT Brain Perfusion Findings: ASPECTS: 4 CBF (<30%) Volume: 0mL Perfusion (Tmax>6.0s) volume: 12mL Mismatch Volume: 12mL, however the core infarct on noncontrast head CT is not being picked up by the automated processing and the true penumbra is less than 12 mL Infarction Location: Right MCA IMPRESSION: 1. Multiple missing right MCA branch vessels in the superior division with wide patency of the M1 segment and no discrete proximal M2 occlusion identified. 2. Widely patent common carotid, internal carotid, and vertebral arteries. 3. CT perfusion demonstrating at most a small penumbra as detailed above. These results were called by telephone at the time of interpretation on 02/14/2020 at 7:55 p.m. to Dr. Laurence Slate, who verbally acknowledged these results. Electronically Signed  By: Sebastian Ache M.D.   On: 02/14/2020 20:22   CT HEAD WO CONTRAST  Result Date: 02/19/2020 CLINICAL DATA:  Stroke, follow-up. EXAM: CT HEAD WITHOUT CONTRAST TECHNIQUE: Contiguous axial images were obtained from the base of the skull through the vertex without  intravenous contrast. COMPARISON:  Head CT 02/07/2020, brain MRI 02/15/2020 FINDINGS: Brain: Again demonstrated are, now subacute, infarcts within the right basal ganglia, right internal capsule, right frontal operculum, right insula and subinsular region as well as left basal/internal capsule. These infarcts have not significant changed in extent as compared to prior head CT 02/15/2020. No significant mass effect. No midline shift. No evidence of hemorrhagic conversion. No new demarcated infarct is identified. No evidence of intracranial mass. No extra-axial fluid collection. Stable, mild generalized parenchymal atrophy. Vascular: Redemonstrated hyperdense right MCA branch within the right sylvian fissure. Skull: Normal. Negative for fracture or focal lesion. Sinuses/Orbits: Visualized orbits demonstrate no acute abnormality. Mild ethmoid sinus mucosal thickening. No significant mastoid effusion at the imaged levels IMPRESSION: Subacute infarcts within the right MCA territory as well as left basal ganglia/internal capsule, unchanged in extent as compared to head CT 02/15/2020. No significant mass effect. No hemorrhagic conversion. No interval infarct is identified. Stable, mild generalized parenchymal atrophy. Mild ethmoid sinus mucosal thickening. Electronically Signed   By: Jackey Loge DO   On: 02/19/2020 10:33   CT HEAD WO CONTRAST  Result Date: 02/15/2020 CLINICAL DATA:  Stroke follow-up. COVID-19 infection. History of HIV. EXAM: CT HEAD WITHOUT CONTRAST TECHNIQUE: Contiguous axial images were obtained from the base of the skull through the vertex without intravenous contrast. COMPARISON:  Head MRI 02/15/2020 FINDINGS: Brain: Acute right MCA infarcts involving the basal ganglia, internal capsule, insula, and frontal operculum are unchanged as are small acute left basal ganglia and left internal capsule infarcts. A subcentimeter focus of hemorrhage in the right frontal operculum is unchanged. No new  infarct, new intracranial hemorrhage, midline shift, or extra-axial fluid collection is identified. Mild ventricular prominence for age is unchanged and likely reflective of mild cerebral atrophy. Vascular: Hyperdense right MCA branch vessels in the sylvian fissure. Skull: No fracture or suspicious osseous lesion. Sinuses/Orbits: Mild right sphenoid sinus mucosal thickening. Small right maxillary sinus mucous retention cyst. Clear mastoid air cells. Unremarkable orbits. Other: None. IMPRESSION: 1. Unchanged acute right MCA infarcts with unchanged small hemorrhage in the right frontal operculum. 2. Unchanged small acute left basal ganglia and internal capsule infarcts. 3. No new intracranial abnormality. Electronically Signed   By: Sebastian Ache M.D.   On: 02/15/2020 19:40   CT ANGIO NECK W OR WO CONTRAST  Result Date: 02/14/2020 CLINICAL DATA:  Stroke. Right-sided weakness and speech disturbance. EXAM: CT ANGIOGRAPHY HEAD AND NECK CT PERFUSION BRAIN TECHNIQUE: Multidetector CT imaging of the head and neck was performed using the standard protocol during bolus administration of intravenous contrast. Multiplanar CT image reconstructions and MIPs were obtained to evaluate the vascular anatomy. Carotid stenosis measurements (when applicable) are obtained utilizing NASCET criteria, using the distal internal carotid diameter as the denominator. Multiphase CT imaging of the brain was performed following IV bolus contrast injection. Subsequent parametric perfusion maps were calculated using RAPID software. CONTRAST:  40mL OMNIPAQUE IOHEXOL 350 MG/ML SOLN; 75mL OMNIPAQUE IOHEXOL 350 MG/ML SOLN COMPARISON:  None. FINDINGS: CTA NECK FINDINGS Aortic arch: Standard 3 vessel aortic arch with widely patent arch vessel origins. Right carotid system: Patent and smooth without evidence of stenosis or dissection. Left carotid system: Patent and smooth without evidence of stenosis or  dissection. Vertebral arteries: Patent, smooth,  and codominant without evidence of stenosis or dissection. Skeleton: Poor dentition with multiple periapical erosions. Other neck: Borderline enlarged level II lymph nodes bilaterally. Upper chest: Centrilobular emphysema. Small superior mediastinal lymph nodes. Review of the MIP images confirms the above findings CTA HEAD FINDINGS Anterior circulation: The internal carotid arteries are widely patent from skull base to carotid termini. The A1 and M1 segments are widely patent with the left A1 segment being mildly hypoplastic. There is a decreased number of branch vessels in the right MCA superior division with suggestion of occlusion and intermittent reconstitution of distal M2 and M3 branches. There is also multifocal irregular narrowing of M3 branches in the right MCA inferior division, and there is mild left MCA branch vessel irregularity. No aneurysm is identified. Posterior circulation: The intracranial vertebral arteries are widely patent to the basilar. The basilar artery is widely patent. There are small posterior communicating arteries bilaterally. PCAs are patent with mild branch vessel irregularity more notable on the right without evidence of flow limiting proximal stenosis. No aneurysm is identified. Venous sinuses: Patent. Anatomic variants: None. Review of the MIP images confirms the above findings CT Brain Perfusion Findings: ASPECTS: 4 CBF (<30%) Volume: 0mL Perfusion (Tmax>6.0s) volume: 12mL Mismatch Volume: 12mL, however the core infarct on noncontrast head CT is not being picked up by the automated processing and the true penumbra is less than 12 mL Infarction Location: Right MCA IMPRESSION: 1. Multiple missing right MCA branch vessels in the superior division with wide patency of the M1 segment and no discrete proximal M2 occlusion identified. 2. Widely patent common carotid, internal carotid, and vertebral arteries. 3. CT perfusion demonstrating at most a small penumbra as detailed above. These  results were called by telephone at the time of interpretation on 02/14/2020 at 7:55 p.m. to Dr. Laurence Slate, who verbally acknowledged these results. Electronically Signed   By: Sebastian Ache M.D.   On: 02/14/2020 20:22   MR BRAIN WO CONTRAST  Result Date: 02/15/2020 CLINICAL DATA:  Stroke.  HIV. EXAM: MRI HEAD WITHOUT CONTRAST TECHNIQUE: Multiplanar, multiecho pulse sequences of the brain and surrounding structures were obtained without intravenous contrast. COMPARISON:  CT head 02/14/2020 FINDINGS: Brain: Acute right MCA infarct involving the right lenticular nucleus as well as the right frontal operculum and right parietal operculum. Small area of acute infarction in the genu internal capsule on the right. Small area of associated hemorrhage in the right frontal operculum and right parietal operculum. Small areas of acute infarct in the left basal ganglia involving the genu internal capsule and posterior external capsule fibers. Ventricle size normal.  No midline shift.  Negative for mass lesion. Vascular: Normal arterial flow voids. Skull and upper cervical spine: No focal skeletal lesion. Sinuses/Orbits: Mild mucosal edema paranasal sinuses. Negative orbit Other: None IMPRESSION: Acute infarct right MCA territory involving the basal ganglia as well as the right frontal and parietal operculum where there are small areas of associated hemorrhage. Small area of acute infarct in the left internal capsule and external capsule. Electronically Signed   By: Marlan Palau M.D.   On: 02/15/2020 07:27   CT CEREBRAL PERFUSION W CONTRAST  Result Date: 02/14/2020 CLINICAL DATA:  Stroke. Right-sided weakness and speech disturbance. EXAM: CT ANGIOGRAPHY HEAD AND NECK CT PERFUSION BRAIN TECHNIQUE: Multidetector CT imaging of the head and neck was performed using the standard protocol during bolus administration of intravenous contrast. Multiplanar CT image reconstructions and MIPs were obtained to evaluate the vascular  anatomy.  Carotid stenosis measurements (when applicable) are obtained utilizing NASCET criteria, using the distal internal carotid diameter as the denominator. Multiphase CT imaging of the brain was performed following IV bolus contrast injection. Subsequent parametric perfusion maps were calculated using RAPID software. CONTRAST:  40mL OMNIPAQUE IOHEXOL 350 MG/ML SOLN; 75mL OMNIPAQUE IOHEXOL 350 MG/ML SOLN COMPARISON:  None. FINDINGS: CTA NECK FINDINGS Aortic arch: Standard 3 vessel aortic arch with widely patent arch vessel origins. Right carotid system: Patent and smooth without evidence of stenosis or dissection. Left carotid system: Patent and smooth without evidence of stenosis or dissection. Vertebral arteries: Patent, smooth, and codominant without evidence of stenosis or dissection. Skeleton: Poor dentition with multiple periapical erosions. Other neck: Borderline enlarged level II lymph nodes bilaterally. Upper chest: Centrilobular emphysema. Small superior mediastinal lymph nodes. Review of the MIP images confirms the above findings CTA HEAD FINDINGS Anterior circulation: The internal carotid arteries are widely patent from skull base to carotid termini. The A1 and M1 segments are widely patent with the left A1 segment being mildly hypoplastic. There is a decreased number of branch vessels in the right MCA superior division with suggestion of occlusion and intermittent reconstitution of distal M2 and M3 branches. There is also multifocal irregular narrowing of M3 branches in the right MCA inferior division, and there is mild left MCA branch vessel irregularity. No aneurysm is identified. Posterior circulation: The intracranial vertebral arteries are widely patent to the basilar. The basilar artery is widely patent. There are small posterior communicating arteries bilaterally. PCAs are patent with mild branch vessel irregularity more notable on the right without evidence of flow limiting proximal stenosis.  No aneurysm is identified. Venous sinuses: Patent. Anatomic variants: None. Review of the MIP images confirms the above findings CT Brain Perfusion Findings: ASPECTS: 4 CBF (<30%) Volume: 0mL Perfusion (Tmax>6.0s) volume: 12mL Mismatch Volume: 12mL, however the core infarct on noncontrast head CT is not being picked up by the automated processing and the true penumbra is less than 12 mL Infarction Location: Right MCA IMPRESSION: 1. Multiple missing right MCA branch vessels in the superior division with wide patency of the M1 segment and no discrete proximal M2 occlusion identified. 2. Widely patent common carotid, internal carotid, and vertebral arteries. 3. CT perfusion demonstrating at most a small penumbra as detailed above. These results were called by telephone at the time of interpretation on 02/14/2020 at 7:55 p.m. to Dr. Laurence Slate, who verbally acknowledged these results. Electronically Signed   By: Sebastian Ache M.D.   On: 02/14/2020 20:22   DG Chest Port 1 View  Result Date: 02/14/2020 CLINICAL DATA:  Acute CVA. Evaluate for pneumonia. EXAM: PORTABLE CHEST 1 VIEW COMPARISON:  Radiograph 07/07/2019. Lung apices from CT angiography earlier today. FINDINGS: Ill-defined opacity in the right mid lung abutting the minor fissure. Mild cardiomegaly. Normal mediastinal contours. No pulmonary edema, large pleural effusion or pneumothorax. No acute osseous abnormalities are seen. IMPRESSION: Ill-defined opacity in the right mid lung abutting the minor fissure, suspicious for pneumonia. Mild cardiomegaly. Electronically Signed   By: Narda Rutherford M.D.   On: 02/14/2020 21:58   DG Chest Port 1V same Day  Result Date: 02/19/2020 CLINICAL DATA:  COVID-19 positive, shortness of breath EXAM: PORTABLE CHEST 1 VIEW COMPARISON:  02/14/2020 FINDINGS: Interval placement of enteric tube coursing below the diaphragm, distal tip beyond the inferior margin of the film. Cardiomediastinal contours are stable. Subtle opacity within  the right mid lung is again seen abutting the minor fissure, slightly improved from prior. No  new focal airspace consolidation. No pleural effusion or pneumothorax. IMPRESSION: 1. Interval placement of enteric tube, distal tip beyond the inferior margin of the film. 2. Slight interval improvement in right mid lung opacity. Electronically Signed   By: Duanne Guess D.O.   On: 02/19/2020 08:54   DG Abd Portable 1V  Result Date: 02/15/2020 CLINICAL DATA:  MRI clearance EXAM: PORTABLE ABDOMEN - 1 VIEW COMPARISON:  None. FINDINGS: The bowel gas pattern is normal. No radio-opaque calculi or other significant radiographic abnormality are seen. IMPRESSION: No radial pain metallic foreign body in the abdomen or pelvis. Electronically Signed   By: Elige Ko   On: 02/15/2020 05:57   EEG adult  Result Date: 02/19/2020 Charlsie Quest, MD     02/19/2020  6:27 PM Patient Name: Tyler Vargas MRN: [id number] Epilepsy Attending: Charlsie Quest Referring Physician/Provider: Dr Jeoffrey Massed Date: 02/19/2020 Duration: 23.23 mins Patient history: 33yo M with acute R MCA stroke as well as L basal ganglia/internal capsue stroke who continues to be encephalopathic. EEG to evaluate for seizure. Level of alertness: awake AEDs during EEG study: None Technical aspects: This EEG study was done with scalp electrodes positioned according to the 10-20 International system of electrode placement. Electrical activity was acquired at a sampling rate of  and reviewed with a high frequency filter of  and a low frequency filter of . EEG data were recorded continuously and digitally stored. DESCRIPTION: The posterior dominant rhythm consists of 8 Hz activity of moderate voltage (25-35 uV) seen predominantly in posterior head regions, symmetric and reactive to eye opening and eye closing. EEG also showed intermittent generalized and maximal right frontotemporal region 3-6hz  theta-delta slowing. Hyperventilation and photic  stimulation were not performed. ABNORMALITY - Intermittent slow, generalized and maximal right frontotemporal region IMPRESSION: This study is suggestive of cortical dysfunction in right frontotemporal region likely secondary to underlying stroke as well as mild diffuse encephalopathy, non specific to etiology. No seizures or epileptiform discharges were seen throughout the recording. Priyanka O Yadav   VAS [place] TRANSCRANIAL DOPPLER W BUBBLES  Result Date: 02/17/2020  Transcranial Doppler with Bubble Indications: PFO. Comparison Study: No prior studies. Performing Technologist: Chanda Busing RVT  Examination Guidelines: A complete evaluation includes B-mode imaging, spectral Doppler, color Doppler, and power Doppler as needed of all accessible portions of each vessel. Bilateral testing is considered an integral part of a complete examination. Limited examinations for reoccurring indications may be performed as noted.  Summary:  A vascular evaluation was performed. The right P2 was studied. An IV was inserted into the patient's right Brachial. Verbal informed consent was obtained. No HITS. Negative for PFO.  Negative TCD Bubble study .No indication of right to left shunt noted. *See table(s) above for TCD measurements and observations.  Diagnosing physician: Delia Heady MD Electronically signed by Delia Heady MD on 02/17/2020 at 12:36:13 PM.    Final    CT HEAD CODE STROKE WO CONTRAST  Result Date: 02/14/2020 CLINICAL DATA:  Code stroke.  Right-sided weakness. EXAM: CT HEAD WITHOUT CONTRAST TECHNIQUE: Contiguous axial images were obtained from the base of the skull through the vertex without intravenous contrast. COMPARISON:  03/07/2014 FINDINGS: Brain: There is an acute right basal ganglia infarct involving the caudate nucleus, lentiform nucleus, and a portion of the anterior limb of the right internal capsule. There is a subcentimeter focus of acute hemorrhage superficially in the right frontal operculum  with a small amount of surrounding low-density, and there is also loss of gray-white differentiation  indicative of an acute infarct in the anterior aspect of the right insula and more anterior aspect of the right frontal lobe. There is a new age indeterminate lacunar infarct in the genu of the left internal capsule. The ventricles are normal in size. There is no midline shift or extra-axial fluid collection. Vascular: Hyperdense right MCA. Skull: No fracture or suspicious osseous lesion. Sinuses/Orbits: Partially visualized small mucous retention cyst in the right maxillary sinus. Clear mastoid air cells. Rightward gaze. Other: None. ASPECTS [facility] Stroke Program Early CT Score) - Ganglionic level infarction (caudate, lentiform nuclei, internal capsule, insula, M1-M3 cortex): 1 - Supraganglionic infarction (M4-M6 cortex): 3 Total score (0-10 with 10 being normal): 4 IMPRESSION: 1. 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 2. ASPECTS is 4. 3. 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 These results were communicated to Dr. Laurence Slate at 7:29 pmon 02/14/2020 by text page via the [facility] messaging system. Electronically Signed   By: Sebastian Ache M.D.   On: 02/14/2020 19:30   VAS [place] UPPER EXTREMITY VENOUS DUPLEX  Result Date: 02/18/2020 UPPER VENOUS STUDY  Indications: Swelling Limitations: Patient positioning. Comparison Study: no prior Performing Technologist: Blanch Media RVS  Examination Guidelines: A complete evaluation includes B-mode imaging, spectral Doppler, color Doppler, and power Doppler as needed of all accessible portions of each vessel. Bilateral testing is considered an integral part of a complete examination. Limited examinations for reoccurring indications may be performed as noted.  Right Findings: +----------+------------+---------+-----------+----------+--------------+ RIGHT      CompressiblePhasicitySpontaneousProperties   Summary     +----------+------------+---------+-----------+----------+--------------+ IJV                                                 Not visualized +----------+------------+---------+-----------+----------+--------------+ Subclavian    Full       Yes       Yes                             +----------+------------+---------+-----------+----------+--------------+ Axillary      Full       Yes       Yes                             +----------+------------+---------+-----------+----------+--------------+ Brachial      Full       Yes       Yes                             +----------+------------+---------+-----------+----------+--------------+ Radial        Full                                                 +----------+------------+---------+-----------+----------+--------------+ Ulnar         Full                                                 +----------+------------+---------+-----------+----------+--------------+  Cephalic      Full                                                 +----------+------------+---------+-----------+----------+--------------+ Basilic       Full                                                 +----------+------------+---------+-----------+----------+--------------+  Summary:  Right: No evidence of deep vein thrombosis in the upper extremity. No evidence of superficial vein thrombosis in the upper extremity.  *See table(s) above for measurements and observations.  Diagnosing physician: Gretta Began MD Electronically signed by Gretta Began MD on 02/18/2020 at 6:41:36 PM.    Final    ECHOCARDIOGRAM LIMITED  Result Date: 02/15/2020    ECHOCARDIOGRAM LIMITED REPORT   Patient Name:   Tyler Vargas Date of Exam: 02/15/2020 Medical Rec #:  [id number]      Height:       68.0 in Accession #:    [id number]     Weight:       152.1 lb Date of Birth:  [birth year]       BSA:          1.819 m Patient  Age:    33 years       BP:           139/86 mmHg Patient Gender: M              HR:           78 bpm. Exam Location:  Inpatient Procedure: Limited Echo, Limited Color Doppler and Cardiac Doppler Indications:    Stroke 434.91 / I163.9                 Endocarditis I38  History:        Patient has no prior history of Echocardiogram examinations.                 HIV.  Sonographer:    Leta Jungling RDCS Referring Phys: [id number] TIMOTHY S OPYD IMPRESSIONS  1. Left ventricular ejection fraction, by estimation, is 55 to 60%. The left ventricle has normal function. The left ventricle has no regional wall motion abnormalities. Left ventricular diastolic parameters were normal.  2. The mitral valve is grossly normal. Trivial mitral valve regurgitation. No evidence of mitral stenosis.  3. The aortic valve is tricuspid. Aortic valve regurgitation is not visualized. No aortic stenosis is present.  4. There is normal pulmonary artery systolic pressure. The estimated right ventricular systolic pressure is 24.0 mmHg.  5. The inferior vena cava is normal in size with greater than 50% respiratory variability, suggesting right atrial pressure of 3 mmHg. Conclusion(s)/Recommendation(s): No evidence of valvular vegetations on this transthoracic echocardiogram. Would recommend a transesophageal echocardiogram to exclude infective endocarditis if clinically indicated. FINDINGS  Left Ventricle: Left ventricular ejection fraction, by estimation, is 55 to 60%. The left ventricle has normal function. The left ventricle has no regional wall motion abnormalities. The left ventricular internal cavity size was normal in size. There is  no left ventricular hypertrophy. Right Ventricle: There is normal pulmonary artery systolic pressure. The tricuspid regurgitant velocity is 2.29 m/s, and with an assumed right atrial pressure of 3  mmHg, the estimated right ventricular systolic pressure is 24.0 mmHg. Left Atrium: Left atrial size was normal in size.  Right Atrium: Right atrial size was normal in size. Pericardium: A small pericardial effusion is present. The pericardial effusion is circumferential. Mitral Valve: The mitral valve is grossly normal. Trivial mitral valve regurgitation. No evidence of mitral valve stenosis. There is no evidence of mitral valve vegetation. Tricuspid Valve: The tricuspid valve is grossly normal. Tricuspid valve regurgitation is trivial. No evidence of tricuspid stenosis. There is no evidence of tricuspid valve vegetation. Aortic Valve: The aortic valve is tricuspid. Aortic valve regurgitation is not visualized. No aortic stenosis is present. There is no evidence of aortic valve vegetation. Pulmonic Valve: The pulmonic valve was grossly normal. Pulmonic valve regurgitation is trivial. No evidence of pulmonic stenosis. There is no evidence of pulmonic valve vegetation. Venous: The inferior vena cava is normal in size with greater than 50% respiratory variability, suggesting right atrial pressure of 3 mmHg. IAS/Shunts: The atrial septum is grossly normal.  LEFT VENTRICLE PLAX 2D LVIDd:         5.00 cm      Diastology LVIDs:         3.50 cm      LV e' lateral:   12.70 cm/s LV PW:         1.00 cm      LV E/e' lateral: 5.0 LV IVS:        1.00 cm      LV e' medial:    12.50 cm/s                             LV E/e' medial:  5.1  LV Volumes (MOD) LV vol d, MOD A2C: 151.0 ml LV vol d, MOD A4C: 177.0 ml LV vol s, MOD A2C: 73.1 ml LV vol s, MOD A4C: 68.9 ml LV SV MOD A2C:     77.9 ml LV SV MOD A4C:     177.0 ml LV SV MOD BP:      94.8 ml LEFT ATRIUM             Index       RIGHT ATRIUM           Index LA diam:        3.30 cm 1.81 cm/m  RA Area:     14.30 cm LA Vol (A2C):   54.3 ml 29.85 ml/m RA Volume:   36.30 ml  19.95 ml/m LA Vol (A4C):   44.8 ml 24.62 ml/m LA Biplane Vol: 53.9 ml 29.63 ml/m  AORTIC VALVE LVOT Vmax:   104.00 cm/s LVOT Vmean:  72.900 cm/s LVOT VTI:    0.168 m  AORTA Ao Root diam: 3.50 cm MITRAL VALVE                TRICUSPID VALVE MV Area (PHT): 2.66 cm    TR Peak grad:   21.0 mmHg MV Decel Time: 285 msec    TR Vmax:        229.00 cm/s MV E velocity: 63.40 cm/s MV A velocity: 75.80 cm/s  SHUNTS MV E/A ratio:  0.84        Systemic VTI: 0.17 m Lennie Odor MD Electronically signed by Lennie Odor MD Signature Date/Time: 02/15/2020/1:27:05 PM    Final

## 2020-02-23 NOTE — Progress Notes (Signed)
Physical Therapy Treatment Patient Details Name: Tyler Vargas MRN: [id number] DOB: [birth year] Today's Date: 02/23/2020    History of Present Illness 34 year old transgender adult with medical history significant for HIV/AIDS with CD4 count 84 and viral load 1.8 million earlier this month, latent syphilis, and cocaine abuse, now presenting to the emergency department with weakness. Pt aphasic and initially not following commands in ED, eventually able to follow commands but not provide history. 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 Acute CVA thought to be secondary to hypercoagulable state from Covid 19 and cocaine use. Pt also + COVID. Admitted 02/14/20 for treatment of acute CVA.  Echo without embolic source, CTA neck without any significant extracranial stenosis, transcranial Doppler negative for PFO. Neurology recommends to continue aspirin. Patient on Enoxaparin for DVT prophylaxis. Negative for DVT RUE 02/18/20.    PT Comments    Patient continues to require two person assist for bed mobility and maxA to maintain sitting balance at EOB. During this session, noted both eyes wide open sitting EOB and at end of session when patient back in supine. LUE appeared to be posturing at times when sitting EOB and noted clonus LUE when repositioning on pillow. Reflexive movement of LUE noted when sitting EOB but still no purposeful movement of trunk or limbs observed. Patient continues to drool when sitting EOB and unable to lift head on own on command. Sitting EOB time limited due to high DBP recorded when sitting (139/104). Once back to supine, BP 102/82. Since last session, palliative care consulted with continued discussion with patient's family. Patient now DNR but with continuing current treatment plan.   Follow Up  Recommendations  SNF;Supervision/Assistance - 24 hour     Equipment Recommendations  Hospital bed, Hoyer lift       Precautions / Restrictions Precautions Precautions: Fall;Other (comment) Precaution Comments: Aspiration precautions, NPO, HOB>30 degrees, NG tube Restrictions Weight Bearing Restrictions: No    Mobility  Bed Mobility Overal bed mobility: Needs Assistance Bed Mobility: Rolling;Sidelying to Sit;Sit to Supine Rolling: Max assist;+2 for physical assistance;+2 for safety/equipment Sidelying to sit: Max assist;+2 for physical assistance;+2 for safety/equipment;HOB elevated;Total assist   Sit to supine: Max assist;Total assist;+2 for physical assistance;+2 for safety/equipment   General bed mobility comments: Patient does not assist with bed mobility and requires assist for head support during supine<>sit transition as well.     Balance Overall balance assessment: Needs assistance Sitting-balance support: Feet supported;Single extremity supported;Bilateral upper extremity supported;No upper extremity supported Sitting balance-Leahy Scale: Zero Sitting balance - Comments: PT attempted to have patient use of UEs to assist with sitting balance by position UEs on either side of patient during sitting. Noted LUE wtih posturing movements while sitting EOB. DBP sitting EOB high so limited time spent sitting EOB (approx 2-3 minutes).   Postural control: Posterior lean     Standing balance comment: not safe to attempt due to poor sitting balance      Cognition Arousal/Alertness: Lethargic(more alert with eyes open sitting EOB and at end of session)   Overall Cognitive Status: Difficult to assess     General Comments: Patient with both eyes open sitting EOB and at end of session. At start of session, patient attempts to open L eye but minimally opens when supine in bed, does not open R eye until sitting EOB. This is the first  time this therapist has seen both eyes so open and  alert.      Exercises Low Level/ICU Exercises Ankle Circles/Pumps: PROM;Supine;5 reps;Both Hip ABduction/ADduction: 5 reps;PROM;Both;Supine Heel Slides: Both;5 reps;PROM;Supine    General Comments General comments (skin integrity, edema, etc.): HR, RR, O2 saturation stable during session on room air. BP 139/104 sitting EOB so patient assisted back to supine and BP 102/82, HR 99 bpm, O2 sat 95%, RR 21. HOB raised to 47 degrees at end of session and patient with stable HR, RR, O2 saturation.        Pertinent Vitals/Pain Pain Assessment: Faces Faces Pain Scale: No hurt Pain Intervention(s): Monitored during session;Repositioned              Frequency    Min 2X/week      PT Plan Current plan remains appropriate       AM-PAC PT "6 Clicks" Mobility   Outcome Measure  Help needed turning from your back to your side while in a flat bed without using bedrails?: Total Help needed moving from lying on your back to sitting on the side of a flat bed without using bedrails?: Total Help needed moving to and from a bed to a chair (including a wheelchair)?: Total Help needed standing up from a chair using your arms (e.g., wheelchair or bedside chair)?: Total Help needed to walk in hospital room?: Total Help needed climbing 3-5 steps with a railing? : Total 6 Click Score: 6    End of Session   Activity Tolerance: Patient limited by lethargy Patient left: in bed;with bed alarm set Nurse Communication: Mobility status;Other (comment);Need for lift equipment(patient status) PT Visit Diagnosis: Unsteadiness on feet (R26.81);Other abnormalities of gait and mobility (R26.89);Muscle weakness (generalized) (M62.81);Difficulty in walking, not elsewhere classified (R26.2);Hemiplegia and hemiparesis     Time: 1223-1248 PT Time Calculation (min) (ACUTE ONLY): 25 min  Charges:  $Therapeutic Activity: 23-37 mins                     [name], PT, DPT Acute Rehab [phone]  office     [name] 02/23/2020, 1:58 PM

## 2020-02-23 NOTE — Care Management (Signed)
Pt does not have LTACH benefits.  If pt becomes stable for an outpt facility other than residential hospice - pt will require SNF.  TOC will continue to follow

## 2020-02-23 NOTE — Progress Notes (Signed)
PMT progress note  Chart checked, checked in with bedside RN as well as with TRH MD. Overall, patient with no significant improvement/recovery. Maybe more awake,tracking providers in the room. Remains non verbal.   BP 106/75 (BP Location: Right Arm)   Pulse 99   Temp 99.2 F (37.3 C) (Axillary)   Resp 19   Ht 5\' 8"  (1.727 m)   Wt 64.8 kg   SpO2 97%   BMI 21.72 kg/m  Labs and imaging noted.   34 y.o. 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  Hospital course complicated by persistent encephalopathy, COVID-19 pneumonia, and Staph bacteremia.  Remains on NGT, tube feedings.   Call placed and discussed with father Mr [place]. We picked up our discussion from yesterday. He has discussed with his family regarding the patient's current hospitalization and overall condition.   Mr [place] is again thankful for the care the patient is receiving. He simply says, "I know you all are doing all that you can for [name]." He again reiterates that placement of PEG tubes and SNF residence would not be in line with the patient's wishes.   We talked about nutrition and hydration, discussed that SLP has been asked to evaluate, to determine if it is at all possible to have a safe possible PO diet.   I gave him some more information about the type of care that can be provided in a residential hospice setting. He approves of comfort care and hospice if the patient has ongoing decline.   PMT to follow, will follow hospital course and check in with father again on 02-24-20.   20 minutes spent.  09-27-1992 MD [place] [facility] [phone].

## 2020-02-24 LAB — GLUCOSE, CAPILLARY
Glucose-Capillary: 102 mg/dL — ABNORMAL HIGH (ref 70–99)
Glucose-Capillary: 123 mg/dL — ABNORMAL HIGH (ref 70–99)
Glucose-Capillary: 125 mg/dL — ABNORMAL HIGH (ref 70–99)
Glucose-Capillary: 121 mg/dL — ABNORMAL HIGH (ref 70–99)
Glucose-Capillary: 126 mg/dL — ABNORMAL HIGH (ref 70–99)

## 2020-02-24 MED ORDER — MORPHINE SULFATE (CONCENTRATE) 10 MG/0.5ML PO SOLN: 5.0000 mg | ORAL | Status: DC | PRN

## 2020-02-24 MED ORDER — GLYCOPYRROLATE 0.2 MG/ML IJ SOLN
0.2000 mg | INTRAMUSCULAR | Status: DC | PRN
  Administered 2020-02-24: 0.2 mg via INTRAVENOUS
  Filled 2020-02-24: qty 1

## 2020-02-24 MED ORDER — LORAZEPAM 0.5 MG PO TABS: 0.5000 mg | ORAL_TABLET | Freq: Three times a day (TID) | ORAL | Status: DC | PRN

## 2020-02-24 NOTE — Plan of Care (Signed)
  Problem: Clinical Measurements: Goal: Respiratory complications will improve Outcome: Progressing   

## 2020-02-24 NOTE — Progress Notes (Addendum)
PROGRESS NOTE                                                                                                                                                                                                             Patient Demographics:    Tyler Vargas, is a 34 y.o. adult, DOB - [birth year], ZOX:[id number]  Outpatient Primary MD for the patient is Patient, No Pcp Per   Admit date - 02/14/2020   LOS - 10  Chief Complaint  Patient presents with  . Code Stroke       Brief Narrative: Patient is a 34 y.o. 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  Hospital course complicated by persistent encephalopathy, dysphagia, COVID-19 pneumonia, and Staph bacteremia.  See below for further details.  Significant Events: 3/27>> admit to [facility] for acute CVA. 3/28>> EEG negative for seizures 3/28>> EF 55-60%, no obvious valvular abnormalities. 3/31>>cortak tube inserted 4/1>> Recurrent fever>>Blood cultures-Prelim MRSA 4/1>> EEG negative for seizures 4/1>> CT head negative for acute abnormalities 4/3>>d/w patients father-DNR  COVID-19 medications: Steroids: 3/28>>4/2 Remdesivir: 3/28>>4/1  Antibiotics: Vancomycin 4/3>> Penicillin G: 3/27>> 3/28  Microbiology data: 4/4>> blood culture: Gram-positive cocci in clusters 4/1>>blood culture:staph simulans/staph epidermidis 3/27>> Blood culture: negative 3/27>> CSF culture: Negative 3/27>> urine culture: Multiple morphotypes 3/27>> VDRL CSF: Nonreactive 3/27>> CSF cryptococcal antigen negative  DVT prophylaxis: SQ Lovenox  Procedures: 3/27 >>lumbar puncture: By ED MD  Consults: ID Neuro Palliative care    Subjective:   [name].  Briefly opens eyes and tracks me at times but still very encephalopathic.   Assessment  & Plan :   Acute CVA: Remains  aphasic-exam is difficult because of encephalopathy.  He has obvious left-sided weakness-but is not moving his right side as well-but picture is clouded by encephalopathy.  Repeat CT head on 4/1 without any new findings.  Spot EEG on 4/1 without obvious seizure-like activity.  Extensive stroke work-up/neurology evaluation completed-CTA neck without any significant extracranial stenosis, echo without any obvious embolic source, transcranial Doppler negative for PFO.  Given ongoing encephalopathy-repeat CT head 4/1 was negative for acute abnormalities.  This MD spoke with Dr. 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  He is still very aphasic-and persistently encephalopathic.  SLP follow-up on 4/5 appreciated-not ready for oral intake in the near future.  Palliative care following-we will await for further goals of care discussion with family-but from preliminary discussion-family not keen on placing PEG tube.  Covid 19 Viral pneumonia: Remains stable on room air-has completed a course of steroids/remdesivir.  Fever: afebrile  O2 requirements:  SpO2: 97 %   COVID-19 Labs: No results for input(s): DDIMER, FERRITIN, LDH, CRP in the last 72 hours.  No results found for: BNP  No results for input(s): PROCALCITON in the last 168 hours.  Lab Results  Component Value Date   SARSCOV2NAA POSITIVE (A) 02/14/2020    ?  Aseptic meningitis: CSF with elevated protein levels-and slight leukocytosis-mostly lymphocytes.  CSF cultures negative-VDRL CSF negative-CSF cryptococcal antigen negative.  Remains persistently encephalopathic-although waxes and wanes at times.    Coagulase-negative Staph bacteremia: Had persistent fever on 4/1-blood cultures done on 4/1-positive for 2 different varieties of  coagulase-negative Staphylococcus.  Repeat blood cultures on 4/4 still pending-but 1 set is positive for gram-positive cocci.  Etiology thought to be secondary to IV infiltration from the right arm.  Appreciate ID input-continue vancomycin.  Will need to repeat blood culture at some point again.  Acute metabolic encephalopathy: Continues to be persistently encephalopathic-although waxes and wanes.  Repeat CT head on 4/1 without any acute findings.  EEG on 4/1 without seizures. Suspect etiology related to encephalitis/aseptic meningitis related to HIV.  Note CSF VDRL and CSF cryptococcal antigen negative.  Continue supportive care.    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  History of syphilis: CSF VDRL negative-not likely to have neurosyphilis as the cause of CVA.  No longer on penicillin G.   Cocaine use: We will counsel when more awake and alert  Right upper extremity swelling: Likely secondary to infiltrated IV-Doppler ultrasound on 3/31 neg for DVT.  Goals of care: Long discussion with father again on 4/3-patient is not married-does not have any children-father was already aware of patient's HIV diagnoses from patient's sister who learned about the diagnosis from patient's roommate.  He is aware of the patient's overall tenuous condition-and underlying serious medical issues as outlined above-he is really appreciative of our efforts to take care of his son.  He understands the critical nature of the situation-and that there is potential for further decline resulting in death during this hospital stay.  He also understands that if the patient does survive-patient may require PEG tube-and may have significant long-term neurological sequelae from his encephalitis/CVA.  I have consulted palliative care-who is talking with the patient's father-we will await further recommendations-but from preliminary discussions-family against PEG tube  placement-and considering residential hospice placement.  Nutrition Problem: Nutrition Problem: Increased nutrient needs Etiology: chronic illness(HIV/AIDS) Signs/Symptoms: estimated needs Interventions: Tube feeding, Prostat  ABG:    Component Value Date/Time   PHART 7.254 (L) 03/07/2014 1019   PCO2ART 40.7 03/07/2014 1019   PO2ART 107.0 (H) 03/07/2014 1019   HCO3 18.0 (L) 03/07/2014 1019   TCO2 27 02/14/2020 1911   ACIDBASEDEF 9.0 (H) 03/07/2014 1019   O2SAT 97.0 03/07/2014 1019    Vent Settings: N/A  Condition -extremely guarded  Family Communication: Spoke with patient's father over the phone-then after his permission-I also spoke to the patient's mother over the phone.  Family still struggling-with goals of care discussion/disposition.  Code Status :  DNR  Diet :  Diet Order            Diet NPO time specified  Diet effective now               Disposition Plan  :  Remain hospitalized  Barriers to discharge: CVA-aphasic-dysphagia-ongoing encephalopathy-fever  Antimicorbials  :    Anti-infectives (From admission, onward)   Start     Dose/Rate Route Frequency Ordered Stop   02/21/20 0900  vancomycin (VANCOCIN) IVPB 1000 mg/200 mL premix     1,000 mg 200 mL/hr over 60 Minutes Intravenous Every 12 hours 02/20/20 2000     02/20/20 2030  vancomycin (VANCOREADY) IVPB 1250 mg/250 mL     1,250 mg 166.7 mL/hr over 90 Minutes Intravenous  Once 02/20/20 1956 02/20/20 2156   02/19/20 1400  abacavir-dolutegravir-lamiVUDine (TRIUMEQ) 600-50-300 MG per tablet 1 tablet     1 tablet Oral Daily 02/19/20 1358     02/19/20 1000  sulfamethoxazole-trimethoprim (BACTRIM) 200-40 MG/5ML suspension 20 mL     20 mL Per Tube Daily 02/19/20 0901     02/17/20 0000  bictegravir-emtricitabine-tenofovir AF (BIKTARVY) 50-200-25 MG TABS tablet     1 tablet Oral Daily 02/17/20 1250 03/18/20 2359   02/16/20 1000  remdesivir 100 mg in sodium chloride 0.9 % 100 mL IVPB     100 mg 200 mL/hr  over 30 Minutes Intravenous Daily 02/15/20 1016 02/19/20 0858   02/15/20 1100  remdesivir 200 mg in sodium chloride 0.9% 250 mL IVPB     200 mg 580 mL/hr over 30 Minutes Intravenous Once 02/15/20 1016 02/15/20 1150   02/15/20 1000  bictegravir-emtricitabine-tenofovir AF (BIKTARVY) 50-200-25 MG per tablet 1 tablet  Status:  Discontinued    Note to Pharmacy: Try to take at the same time each day with or without food.     1 tablet Oral Daily 02/14/20 2231 02/19/20 1358   02/15/20 1000  sulfamethoxazole-trimethoprim (BACTRIM DS) 800-160 MG per tablet 1 tablet  Status:  Discontinued     1 tablet Oral Daily 02/14/20 2231 02/19/20 0901   02/15/20 0530  vancomycin (VANCOCIN) IVPB 1000 mg/200 mL premix  Status:  Discontinued     1,000 mg 200 mL/hr over 60 Minutes Intravenous Every 8 hours 02/14/20 2120 02/15/20 0957   02/14/20 2215  penicillin G potassium 4 Million Units in dextrose 5 % 250 mL IVPB  Status:  Discontinued     4 Million Units 250 mL/hr over 60 Minutes Intravenous Every 4 hours 02/14/20 2202 02/15/20 1653   02/14/20 2200  ceFEPIme (MAXIPIME) 2 g in sodium chloride 0.9 % 100 mL IVPB  Status:  Discontinued     2 g 200 mL/hr over 30 Minutes Intravenous Every 8 hours 02/14/20 2120 02/14/20 2145   02/14/20 2130  vancomycin (VANCOREADY) IVPB 1500 mg/300 mL     1,500 mg 150 mL/hr over 120 Minutes Intravenous  Once 02/14/20 2120 02/15/20 0044   02/14/20 2115  ceFEPIme (MAXIPIME) 2 g in sodium chloride 0.9 % 100 mL IVPB  Status:  Discontinued     2 g 200 mL/hr over 30 Minutes Intravenous  Once 02/14/20 2103 02/14/20 2120   02/14/20 2115  metroNIDAZOLE (FLAGYL) IVPB 500 mg  Status:  Discontinued     500 mg 100 mL/hr over 60 Minutes Intravenous  Once 02/14/20 2103 02/14/20 2120   02/14/20 2115  vancomycin (VANCOCIN) IVPB 1000 mg/200 mL premix  Status:  Discontinued     1,000 mg 200 mL/hr over 60 Minutes Intravenous  Once 02/14/20 2103 02/14/20 2120      Inpatient Medications  Scheduled  Meds: .  stroke: mapping our early stages of recovery book   Does not apply Once  . abacavir-dolutegravir-lamiVUDine  1 tablet Oral Daily  . aspirin  325 mg Per NG tube Daily  . enoxaparin (LOVENOX) injection  40 mg Subcutaneous Q24H  . feeding supplement (PRO-STAT SUGAR FREE 64)  30 mL Per Tube BID  . influenza vac split quadrivalent PF  0.5 mL Intramuscular Tomorrow-1000  . pneumococcal 23 valent vaccine  0.5 mL Intramuscular Tomorrow-1000  . sulfamethoxazole-trimethoprim  20 mL Per Tube Daily   Continuous Infusions: . dextrose 5% lactated ringers 10 mL/hr at 02/21/20 0400  . feeding supplement (OSMOLITE 1.5 CAL) 1,000 mL (02/24/20 1135)  . vancomycin 1,000 mg (02/24/20 0847)   PRN Meds:.acetaminophen **OR** acetaminophen (TYLENOL) oral liquid 160 mg/5 mL **OR** acetaminophen, senna-docusate   Time Spent in minutes  25  See all Orders from today for further details   Jeoffrey Massed M.D on 02/24/2020 at 11:36 AM  To page go to [email or web address] - use universal password  [facility] -  Office  [phone]    Objective:   Vitals:   02/24/20 0006 02/24/20 0400 02/24/20 0740 02/24/20 1128  BP: 134/77 118/76 115/76 111/78  Pulse: (!) 117 (!) 112 (!) 106 (!) 103  Resp: (!) 23 (!) 27 (!) 21 (!) 25  Temp: 98.2 F (36.8 C) 99.5 F (37.5 C) (!) 101.1 F (38.4 C) 99 F (37.2 C)  TempSrc: Axillary Axillary Axillary Axillary  SpO2: 90% 95% 95% 97%  Weight:      Height:        Wt Readings from Last 3 Encounters:  02/21/20 64.8 kg  01/28/20 69.9 kg  10/30/19 57.2 kg     Intake/Output Summary (Last 24 hours) at 02/24/2020 1136 Last data filed at 02/24/2020 0948 Gross per 24 hour  Intake 1720 ml  Output 1375 ml  Net 345 ml     Physical Exam Gen Exam: Aphasic-opens eyes at times-not tracking the as much as she was doing yesterday.  Does not follow commands. HEENT:atraumatic, normocephalic Chest: B/L clear to auscultation anteriorly CVS:S1S2 regular Abdomen:soft non  tender, non distended Extremities:no edema Neurology: Withdraws lower extremities to pain-but otherwise does not have any purposeful movement. Skin: no rash   Data Review:    CBC Recent Labs  Lab 02/19/20 0450 02/19/20 1431 02/20/20 0541 02/21/20 0343 02/23/20 0253  WBC 8.2 12.7* 9.0 4.4 5.5  HGB 11.1* 11.3* 11.3* 11.7* 12.2*  HCT 34.6* 35.2* 35.8* 36.2* 37.5*  PLT 690* 710* 684* 581* 536*  MCV 90.8 91.7 92.7 90.7 90.6  MCH 29.1 29.4 29.3 29.3 29.5  MCHC 32.1 32.1 31.6 32.3 32.5  RDW 15.2 15.6* 15.7* 15.6* 15.8*  LYMPHSABS  --  0.7  --   --   --   MONOABS  --  0.2  --   --   --   EOSABS  --  0.0  --   --   --   BASOSABS  --  0.0  --   --   --     Chemistries  Recent Labs  Lab 02/18/20 0336 02/19/20 0450 02/20/20 0541 02/21/20 0343 02/23/20 0253  NA 136 136 135 135 129*  K 3.8 3.6 3.9 4.2 4.4  CL 103 101 100 101 95*  CO2 22 23 26 22 22   GLUCOSE 371* 136* 106* 107* 133*  BUN 12 13 16 19 20   CREATININE  0.75 0.82 0.98 0.90 0.82  CALCIUM 8.6* 8.4* 8.5* 8.8* 8.8*  AST 43* 60* 81* 55* 64*  ALT 18 29 32 31 28  ALKPHOS 41 57 72 71 69  BILITOT <0.1* 0.3 0.5 0.3 0.3   ------------------------------------------------------------------------------------------------------------------ No results for input(s): CHOL, HDL, LDLCALC, TRIG, CHOLHDL, LDLDIRECT in the last 72 hours.  Lab Results  Component Value Date   HGBA1C 5.9 (H) 02/15/2020   ------------------------------------------------------------------------------------------------------------------ No results for input(s): TSH, T4TOTAL, T3FREE, THYROIDAB in the last 72 hours.  Invalid input(s): FREET3 ------------------------------------------------------------------------------------------------------------------ No results for input(s): VITAMINB12, FOLATE, FERRITIN, TIBC, IRON, RETICCTPCT in the last 72 hours.  Coagulation profile No results for input(s): INR, PROTIME in the last 168 hours.  No results for  input(s): DDIMER in the last 72 hours.  Cardiac Enzymes No results for input(s): CKMB, TROPONINI, MYOGLOBIN in the last 168 hours.  Invalid input(s): CK ------------------------------------------------------------------------------------------------------------------ No results found for: BNP  Micro Results Recent Results (from the past 240 hour(s))  Culture, blood (Routine X 2) w Reflex to ID Panel     Status: None   Collection Time: 02/14/20  9:40 PM   Specimen: BLOOD  Result Value Ref Range Status   Specimen Description BLOOD RIGHT ARM  Final   Special Requests   Final    BOTTLES DRAWN AEROBIC AND ANAEROBIC Blood Culture adequate volume   Culture   Final    NO GROWTH 5 DAYS Performed at [facility], [address], [place], [place] [address]    Report Status 02/19/2020 FINAL  Final  Culture, blood (Routine X 2) w Reflex to ID Panel     Status: None   Collection Time: 02/14/20  9:46 PM   Specimen: BLOOD  Result Value Ref Range Status   Specimen Description BLOOD RIGHT FOREARM  Final   Special Requests   Final    BOTTLES DRAWN AEROBIC AND ANAEROBIC Blood Culture adequate volume   Culture   Final    NO GROWTH 5 DAYS Performed at [facility], [address], [place], [place] [address]    Report Status 02/19/2020 FINAL  Final  Urine culture     Status: Abnormal   Collection Time: 02/14/20  9:46 PM   Specimen: In/Out Cath Urine  Result Value Ref Range Status   Specimen Description IN/OUT CATH URINE  Final   Special Requests   Final    NONE Performed at [facility], [address] [place], [place] [address]    Culture MULTIPLE SPECIES PRESENT, SUGGEST RECOLLECTION (A)  Final   Report Status 02/16/2020 FINAL  Final  SARS CORONAVIRUS 2 (TAT 6-24 HRS)     Status: Abnormal   Collection Time: 02/14/20 10:55 PM  Result Value Ref Range Status   SARS Coronavirus 2 POSITIVE (A) NEGATIVE Final    Comment: RESULT CALLED TO, READ BACK BY AND VERIFIED WITH: K.  GUY,RN 0335 02/15/2020 T. TYSOR (NOTE) SARS-CoV-2 target nucleic acids are DETECTED. The SARS-CoV-2 RNA is generally detectable in upper and lower respiratory specimens during the acute phase of infection. Positive results are indicative of the presence of SARS-CoV-2 RNA. Clinical correlation with patient history and other diagnostic information is  necessary to determine patient infection status. Positive results do not rule out bacterial infection or co-infection with other viruses.  The expected result is Negative. Fact Sheet for Patients: HairSlick.no Fact Sheet for Healthcare Providers: quierodirigir.com This test is not yet approved or cleared by the [place] FDA and  has been  authorized for detection and/or diagnosis of SARS-CoV-2 by FDA under an Emergency Use Authorization (EUA). This EUA will remain  in effect (meaning this test can be used) for the  duration of the COVID-19 declaration under Section 564(b)(1) of the Act, 21 U.S.C. section 360bbb-3(b)(1), unless the authorization is terminated or revoked sooner. Performed at [facility], [address] [place], [place] [address]   CSF culture with Stat gram stain     Status: None   Collection Time: 02/14/20 11:35 PM   Specimen: CSF; Cerebrospinal Fluid  Result Value Ref Range Status   Specimen Description CSF  Final   Special Requests NONE  Final   Gram Stain   Final    CYTOSPIN SMEAR WBC PRESENT,BOTH PMN AND MONONUCLEAR NO ORGANISMS SEEN    Culture   Final    NO GROWTH 3 DAYS Performed at [facility], [address], [place], [place] [address]    Report Status 02/18/2020 FINAL  Final  Culture, blood (routine x 2)     Status: Abnormal   Collection Time: 02/19/20  7:39 AM   Specimen: BLOOD  Result Value Ref Range Status   Specimen Description BLOOD RIGHT ANTECUBITAL  Final   Special Requests   Final    BOTTLES DRAWN AEROBIC AND ANAEROBIC  Blood Culture adequate volume   Culture  Setup Time   Final    GRAM POSITIVE COCCI IN CLUSTERS IN BOTH AEROBIC AND ANAEROBIC BOTTLES Organism ID to follow CRITICAL RESULT CALLED TO, READ BACK BY AND VERIFIED WITHColin Rhein PHARMD 2148 02/20/20 A BROWNING Performed at [facility], [address], [place], [place] [address]    Culture (A)  Final    STAPHYLOCOCCUS SIMULANS CORRECTED ON 04/04 AT 1031: PREVIOUSLY REPORTED AS STAPHYLOCOCCUS SPECIES (COAGULASE NEGATIVE)   Report Status 02/23/2020 FINAL  Final   Organism ID, Bacteria STAPHYLOCOCCUS SIMULANS  Final      Susceptibility   Staphylococcus simulans - MIC*    CIPROFLOXACIN <=0.5 SENSITIVE Sensitive     ERYTHROMYCIN >=8 RESISTANT Resistant     GENTAMICIN <=0.5 SENSITIVE Sensitive     OXACILLIN >=4 RESISTANT Resistant     TETRACYCLINE >=16 RESISTANT Resistant     VANCOMYCIN <=0.5 SENSITIVE Sensitive     TRIMETH/SULFA 160 RESISTANT Resistant     CLINDAMYCIN RESISTANT Resistant     RIFAMPIN <=0.5 SENSITIVE Sensitive     Inducible Clindamycin POSITIVE Resistant     * STAPHYLOCOCCUS SIMULANS CORRECTED ON 04/04 AT 1031: PREVIOUSLY REPORTED AS STAPHYLOCOCCUS SPECIES (COAGULASE NEGATIVE)  Blood Culture ID Panel (Reflexed)     Status: Abnormal   Collection Time: 02/19/20  7:39 AM  Result Value Ref Range Status   Enterococcus species NOT DETECTED NOT DETECTED Final   Listeria monocytogenes NOT DETECTED NOT DETECTED Final   Staphylococcus species DETECTED (A) NOT DETECTED Final    Comment: Methicillin (oxacillin) resistant coagulase negative staphylococcus. Possible blood culture contaminant (unless isolated from more than one blood culture draw or clinical case suggests pathogenicity). No antibiotic treatment is indicated for blood  culture contaminants. CRITICAL RESULT CALLED TO, READ BACK BY AND VERIFIED WITH: B MANCHERIL PHARMD 9147 02/20/20 A BROWNING    Staphylococcus aureus (BCID) NOT DETECTED NOT DETECTED Final   Methicillin  resistance DETECTED (A) NOT DETECTED Final    Comment: CRITICAL RESULT CALLED TO, READ BACK BY AND VERIFIED WITH: B MANCHERIL PHARMD 2148 02/20/20 A BROWNING    Streptococcus species NOT DETECTED NOT DETECTED Final   Streptococcus agalactiae  NOT DETECTED NOT DETECTED Final   Streptococcus pneumoniae NOT DETECTED NOT DETECTED Final   Streptococcus pyogenes NOT DETECTED NOT DETECTED Final   Acinetobacter baumannii NOT DETECTED NOT DETECTED Final   Enterobacteriaceae species NOT DETECTED NOT DETECTED Final   Enterobacter cloacae complex NOT DETECTED NOT DETECTED Final   Escherichia coli NOT DETECTED NOT DETECTED Final   Klebsiella oxytoca NOT DETECTED NOT DETECTED Final   Klebsiella pneumoniae NOT DETECTED NOT DETECTED Final   Proteus species NOT DETECTED NOT DETECTED Final   Serratia marcescens NOT DETECTED NOT DETECTED Final   Haemophilus influenzae NOT DETECTED NOT DETECTED Final   Neisseria meningitidis NOT DETECTED NOT DETECTED Final   Pseudomonas aeruginosa NOT DETECTED NOT DETECTED Final   Candida albicans NOT DETECTED NOT DETECTED Final   Candida glabrata NOT DETECTED NOT DETECTED Final   Candida krusei NOT DETECTED NOT DETECTED Final   Candida parapsilosis NOT DETECTED NOT DETECTED Final   Candida tropicalis NOT DETECTED NOT DETECTED Final    Comment: Performed at [facility], [address], [place], [place] [address]  Culture, blood (routine x 2)     Status: Abnormal   Collection Time: 02/19/20  7:47 AM   Specimen: BLOOD  Result Value Ref Range Status   Specimen Description BLOOD RIGHT ANTECUBITAL  Final   Special Requests   Final    BOTTLES DRAWN AEROBIC AND ANAEROBIC Blood Culture adequate volume   Culture  Setup Time   Final    GRAM POSITIVE COCCI IN CLUSTERS IN BOTH AEROBIC AND ANAEROBIC BOTTLES CRITICAL RESULT CALLED TO, READ BACK BY AND VERIFIED WITH: Janice Norrie, PHARMD AT 1908 ON 02/20/20 BY C. JESSUP, MT Performed at [facility], [address],  [place], [place] [address]    Culture STAPHYLOCOCCUS EPIDERMIDIS (A)  Final   Report Status 02/23/2020 FINAL  Final   Organism ID, Bacteria STAPHYLOCOCCUS EPIDERMIDIS  Final      Susceptibility   Staphylococcus epidermidis - MIC*    CIPROFLOXACIN <=0.5 SENSITIVE Sensitive     ERYTHROMYCIN >=8 RESISTANT Resistant     GENTAMICIN <=0.5 SENSITIVE Sensitive     OXACILLIN 1 RESISTANT Resistant     TETRACYCLINE <=1 SENSITIVE Sensitive     VANCOMYCIN <=0.5 SENSITIVE Sensitive     TRIMETH/SULFA 20 SENSITIVE Sensitive     CLINDAMYCIN >=8 RESISTANT Resistant     RIFAMPIN <=0.5 SENSITIVE Sensitive     Inducible Clindamycin NEGATIVE Sensitive     * STAPHYLOCOCCUS EPIDERMIDIS  Culture, blood (routine x 2)     Status: None (Preliminary result)   Collection Time: 02/22/20  7:02 AM   Specimen: BLOOD  Result Value Ref Range Status   Specimen Description BLOOD LEFT ANTECUBITAL  Final   Special Requests   Final    BOTTLES DRAWN AEROBIC AND ANAEROBIC Blood Culture adequate volume   Culture  Setup Time   Final    GRAM POSITIVE COCCI IN BOTH AEROBIC AND ANAEROBIC BOTTLES CRITICAL RESULT CALLED TO, READ BACK BY AND VERIFIED WITH: PHRMD J LEDFORD  02/23/20 BY S GEZAHEGN    Culture   Final    CULTURE REINCUBATED FOR BETTER GROWTH Performed at [facility], [address], [place], [place] [address]    Report Status PENDING  Incomplete  Culture, blood (routine x 2)     Status: None (Preliminary result)   Collection Time: 02/22/20  7:11 AM   Specimen: BLOOD  Result Value Ref Range Status   Specimen Description BLOOD LEFT ARM  Final   Special Requests   Final    BOTTLES DRAWN AEROBIC AND ANAEROBIC Blood Culture adequate volume   Culture   Final    NO GROWTH 2 DAYS Performed at [facility], [address], [place], [place] [address]    Report Status PENDING  Incomplete    Radiology Reports EEG  Result Date: 02/15/2020 Charlsie Quest, MD     02/15/2020  4:13 PM Patient Name: Tyler Vargas MRN: [id number] Epilepsy Attending: Charlsie Quest Referring Physician/Provider: Dr Georgiana Spinner Aroor Date: 02/15/2020 Duration: 23.26 mins Patient history: 33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 EEG to evaluate for seizure Level of alertness: asleep AEDs during EEG study: None Technical aspects: This EEG study was done with scalp electrodes positioned according to the 10-20 International system of electrode placement. Electrical activity was acquired at a sampling rate of  and reviewed with a high frequency filter of  and a low frequency filter of . EEG data were recorded continuously and digitally stored. DESCRIPTION:  Sleep was characterized by vertex waves, sleep spindles, maximal frontocentral. Hyperventilation and photic stimulation were not performed. IMPRESSION: This study during sleep only is within normal limits. No seizures or epileptiform discharges were seen throughout the recording. If suspicion for interictal activity remains a concern, a prolonged study can be considered. Priyanka Annabelle Harman   CT ANGIO HEAD W OR WO CONTRAST  Result Date: 02/14/2020 CLINICAL DATA:  Stroke. Right-sided weakness and speech disturbance. EXAM: CT ANGIOGRAPHY HEAD AND NECK CT PERFUSION BRAIN TECHNIQUE: Multidetector CT imaging of the head and neck was performed using the standard protocol during bolus administration of intravenous contrast. Multiplanar CT image reconstructions and MIPs were obtained to evaluate the vascular anatomy. Carotid stenosis measurements (when applicable) are obtained utilizing NASCET criteria, using the distal internal carotid diameter as the denominator. Multiphase CT imaging of the brain was performed following IV bolus  contrast injection. Subsequent parametric perfusion maps were calculated using RAPID software. CONTRAST:  40mL OMNIPAQUE IOHEXOL 350 MG/ML SOLN; 75mL OMNIPAQUE IOHEXOL 350 MG/ML SOLN COMPARISON:  None. FINDINGS: CTA NECK FINDINGS Aortic arch: Standard 3 vessel aortic arch with widely patent arch vessel origins. Right carotid system: Patent and smooth without evidence of stenosis or dissection. Left carotid system: Patent and smooth without evidence of stenosis or dissection. Vertebral arteries: Patent, smooth, and codominant without evidence of stenosis or dissection. Skeleton: Poor dentition with multiple periapical erosions. Other neck: Borderline enlarged level II lymph nodes bilaterally. Upper chest: Centrilobular emphysema. Small superior mediastinal lymph nodes. Review of the MIP images confirms the above findings CTA HEAD FINDINGS Anterior circulation: The internal carotid arteries are widely patent from skull base to carotid termini. The A1 and M1 segments are widely patent with the left A1 segment being mildly hypoplastic. There is a decreased number of branch vessels in the right MCA superior division with suggestion of occlusion and intermittent reconstitution of distal M2 and M3 branches. There is also multifocal irregular narrowing of M3 branches in the right MCA inferior division, and there is mild left MCA branch vessel irregularity. No aneurysm is identified. Posterior circulation: The intracranial vertebral arteries are widely patent to the basilar. The basilar artery is widely patent. There are small posterior communicating arteries bilaterally. PCAs are patent with mild  branch vessel irregularity more notable on the right without evidence of flow limiting proximal stenosis. No aneurysm is identified. Venous sinuses: Patent. Anatomic variants: None. Review of the MIP images confirms the above findings CT Brain Perfusion Findings: ASPECTS: 4 CBF (<30%) Volume: 0mL Perfusion (Tmax>6.0s) volume: 12mL  Mismatch Volume: 12mL, however the core infarct on noncontrast head CT is not being picked up by the automated processing and the true penumbra is less than 12 mL Infarction Location: Right MCA IMPRESSION: 1. Multiple missing right MCA branch vessels in the superior division with wide patency of the M1 segment and no discrete proximal M2 occlusion identified. 2. Widely patent common carotid, internal carotid, and vertebral arteries. 3. CT perfusion demonstrating at most a small penumbra as detailed above. These results were called by telephone at the time of interpretation on 02/14/2020 at 7:55 p.m. to Dr. Laurence SlateAroor, who verbally acknowledged these results. Electronically Signed   By: Sebastian AcheAllen  Grady M.D.   On: 02/14/2020 20:22   CT HEAD WO CONTRAST  Result Date: 02/19/2020 CLINICAL DATA:  Stroke, follow-up. EXAM: CT HEAD WITHOUT CONTRAST TECHNIQUE: Contiguous axial images were obtained from the base of the skull through the vertex without intravenous contrast. COMPARISON:  Head CT 02/07/2020, brain MRI 02/15/2020 FINDINGS: Brain: Again demonstrated are, now subacute, infarcts within the right basal ganglia, right internal capsule, right frontal operculum, right insula and subinsular region as well as left basal/internal capsule. These infarcts have not significant changed in extent as compared to prior head CT 02/15/2020. No significant mass effect. No midline shift. No evidence of hemorrhagic conversion. No new demarcated infarct is identified. No evidence of intracranial mass. No extra-axial fluid collection. Stable, mild generalized parenchymal atrophy. Vascular: Redemonstrated hyperdense right MCA branch within the right sylvian fissure. Skull: Normal. Negative for fracture or focal lesion. Sinuses/Orbits: Visualized orbits demonstrate no acute abnormality. Mild ethmoid sinus mucosal thickening. No significant mastoid effusion at the imaged levels IMPRESSION: Subacute infarcts within the right MCA territory as well  as left basal ganglia/internal capsule, unchanged in extent as compared to head CT 02/15/2020. No significant mass effect. No hemorrhagic conversion. No interval infarct is identified. Stable, mild generalized parenchymal atrophy. Mild ethmoid sinus mucosal thickening. Electronically Signed   By: Jackey LogeKyle  Golden DO   On: 02/19/2020 10:33   CT HEAD WO CONTRAST  Result Date: 02/15/2020 CLINICAL DATA:  Stroke follow-up. COVID-19 infection. History of HIV. EXAM: CT HEAD WITHOUT CONTRAST TECHNIQUE: Contiguous axial images were obtained from the base of the skull through the vertex without intravenous contrast. COMPARISON:  Head MRI 02/15/2020 FINDINGS: Brain: Acute right MCA infarcts involving the basal ganglia, internal capsule, insula, and frontal operculum are unchanged as are small acute left basal ganglia and left internal capsule infarcts. A subcentimeter focus of hemorrhage in the right frontal operculum is unchanged. No new infarct, new intracranial hemorrhage, midline shift, or extra-axial fluid collection is identified. Mild ventricular prominence for age is unchanged and likely reflective of mild cerebral atrophy. Vascular: Hyperdense right MCA branch vessels in the sylvian fissure. Skull: No fracture or suspicious osseous lesion. Sinuses/Orbits: Mild right sphenoid sinus mucosal thickening. Small right maxillary sinus mucous retention cyst. Clear mastoid air cells. Unremarkable orbits. Other: None. IMPRESSION: 1. Unchanged acute right MCA infarcts with unchanged small hemorrhage in the right frontal operculum. 2. Unchanged small acute left basal ganglia and internal capsule infarcts. 3. No new intracranial abnormality. Electronically Signed   By: Sebastian AcheAllen  Grady M.D.   On: 02/15/2020 19:40   CT ANGIO NECK W  OR WO CONTRAST  Result Date: 02/14/2020 CLINICAL DATA:  Stroke. Right-sided weakness and speech disturbance. EXAM: CT ANGIOGRAPHY HEAD AND NECK CT PERFUSION BRAIN TECHNIQUE: Multidetector CT imaging of  the head and neck was performed using the standard protocol during bolus administration of intravenous contrast. Multiplanar CT image reconstructions and MIPs were obtained to evaluate the vascular anatomy. Carotid stenosis measurements (when applicable) are obtained utilizing NASCET criteria, using the distal internal carotid diameter as the denominator. Multiphase CT imaging of the brain was performed following IV bolus contrast injection. Subsequent parametric perfusion maps were calculated using RAPID software. CONTRAST:  40mL OMNIPAQUE IOHEXOL 350 MG/ML SOLN; 75mL OMNIPAQUE IOHEXOL 350 MG/ML SOLN COMPARISON:  None. FINDINGS: CTA NECK FINDINGS Aortic arch: Standard 3 vessel aortic arch with widely patent arch vessel origins. Right carotid system: Patent and smooth without evidence of stenosis or dissection. Left carotid system: Patent and smooth without evidence of stenosis or dissection. Vertebral arteries: Patent, smooth, and codominant without evidence of stenosis or dissection. Skeleton: Poor dentition with multiple periapical erosions. Other neck: Borderline enlarged level II lymph nodes bilaterally. Upper chest: Centrilobular emphysema. Small superior mediastinal lymph nodes. Review of the MIP images confirms the above findings CTA HEAD FINDINGS Anterior circulation: The internal carotid arteries are widely patent from skull base to carotid termini. The A1 and M1 segments are widely patent with the left A1 segment being mildly hypoplastic. There is a decreased number of branch vessels in the right MCA superior division with suggestion of occlusion and intermittent reconstitution of distal M2 and M3 branches. There is also multifocal irregular narrowing of M3 branches in the right MCA inferior division, and there is mild left MCA branch vessel irregularity. No aneurysm is identified. Posterior circulation: The intracranial vertebral arteries are widely patent to the basilar. The basilar artery is widely  patent. There are small posterior communicating arteries bilaterally. PCAs are patent with mild branch vessel irregularity more notable on the right without evidence of flow limiting proximal stenosis. No aneurysm is identified. Venous sinuses: Patent. Anatomic variants: None. Review of the MIP images confirms the above findings CT Brain Perfusion Findings: ASPECTS: 4 CBF (<30%) Volume: 0mL Perfusion (Tmax>6.0s) volume: 12mL Mismatch Volume: 12mL, however the core infarct on noncontrast head CT is not being picked up by the automated processing and the true penumbra is less than 12 mL Infarction Location: Right MCA IMPRESSION: 1. Multiple missing right MCA branch vessels in the superior division with wide patency of the M1 segment and no discrete proximal M2 occlusion identified. 2. Widely patent common carotid, internal carotid, and vertebral arteries. 3. CT perfusion demonstrating at most a small penumbra as detailed above. These results were called by telephone at the time of interpretation on 02/14/2020 at 7:55 p.m. to Dr. Laurence Slate, who verbally acknowledged these results. Electronically Signed   By: Sebastian Ache M.D.   On: 02/14/2020 20:22   MR BRAIN WO CONTRAST  Result Date: 02/15/2020 CLINICAL DATA:  Stroke.  HIV. EXAM: MRI HEAD WITHOUT CONTRAST TECHNIQUE: Multiplanar, multiecho pulse sequences of the brain and surrounding structures were obtained without intravenous contrast. COMPARISON:  CT head 02/14/2020 FINDINGS: Brain: Acute right MCA infarct involving the right lenticular nucleus as well as the right frontal operculum and right parietal operculum. Small area of acute infarction in the genu internal capsule on the right. Small area of associated hemorrhage in the right frontal operculum and right parietal operculum. Small areas of acute infarct in the left basal ganglia involving the genu internal capsule and posterior  external capsule fibers. Ventricle size normal.  No midline shift.  Negative for mass  lesion. Vascular: Normal arterial flow voids. Skull and upper cervical spine: No focal skeletal lesion. Sinuses/Orbits: Mild mucosal edema paranasal sinuses. Negative orbit Other: None IMPRESSION: Acute infarct right MCA territory involving the basal ganglia as well as the right frontal and parietal operculum where there are small areas of associated hemorrhage. Small area of acute infarct in the left internal capsule and external capsule. Electronically Signed   By: Marlan Palau M.D.   On: 02/15/2020 07:27   CT CEREBRAL PERFUSION W CONTRAST  Result Date: 02/14/2020 CLINICAL DATA:  Stroke. Right-sided weakness and speech disturbance. EXAM: CT ANGIOGRAPHY HEAD AND NECK CT PERFUSION BRAIN TECHNIQUE: Multidetector CT imaging of the head and neck was performed using the standard protocol during bolus administration of intravenous contrast. Multiplanar CT image reconstructions and MIPs were obtained to evaluate the vascular anatomy. Carotid stenosis measurements (when applicable) are obtained utilizing NASCET criteria, using the distal internal carotid diameter as the denominator. Multiphase CT imaging of the brain was performed following IV bolus contrast injection. Subsequent parametric perfusion maps were calculated using RAPID software. CONTRAST:  40mL OMNIPAQUE IOHEXOL 350 MG/ML SOLN; 75mL OMNIPAQUE IOHEXOL 350 MG/ML SOLN COMPARISON:  None. FINDINGS: CTA NECK FINDINGS Aortic arch: Standard 3 vessel aortic arch with widely patent arch vessel origins. Right carotid system: Patent and smooth without evidence of stenosis or dissection. Left carotid system: Patent and smooth without evidence of stenosis or dissection. Vertebral arteries: Patent, smooth, and codominant without evidence of stenosis or dissection. Skeleton: Poor dentition with multiple periapical erosions. Other neck: Borderline enlarged level II lymph nodes bilaterally. Upper chest: Centrilobular emphysema. Small superior mediastinal lymph nodes.  Review of the MIP images confirms the above findings CTA HEAD FINDINGS Anterior circulation: The internal carotid arteries are widely patent from skull base to carotid termini. The A1 and M1 segments are widely patent with the left A1 segment being mildly hypoplastic. There is a decreased number of branch vessels in the right MCA superior division with suggestion of occlusion and intermittent reconstitution of distal M2 and M3 branches. There is also multifocal irregular narrowing of M3 branches in the right MCA inferior division, and there is mild left MCA branch vessel irregularity. No aneurysm is identified. Posterior circulation: The intracranial vertebral arteries are widely patent to the basilar. The basilar artery is widely patent. There are small posterior communicating arteries bilaterally. PCAs are patent with mild branch vessel irregularity more notable on the right without evidence of flow limiting proximal stenosis. No aneurysm is identified. Venous sinuses: Patent. Anatomic variants: None. Review of the MIP images confirms the above findings CT Brain Perfusion Findings: ASPECTS: 4 CBF (<30%) Volume: 0mL Perfusion (Tmax>6.0s) volume: 12mL Mismatch Volume: 12mL, however the core infarct on noncontrast head CT is not being picked up by the automated processing and the true penumbra is less than 12 mL Infarction Location: Right MCA IMPRESSION: 1. Multiple missing right MCA branch vessels in the superior division with wide patency of the M1 segment and no discrete proximal M2 occlusion identified. 2. Widely patent common carotid, internal carotid, and vertebral arteries. 3. CT perfusion demonstrating at most a small penumbra as detailed above. These results were called by telephone at the time of interpretation on 02/14/2020 at 7:55 p.m. to Dr. Laurence Slate, who verbally acknowledged these results. Electronically Signed   By: Sebastian Ache M.D.   On: 02/14/2020 20:22   DG Chest Port 1 View  Result Date:  02/14/2020 CLINICAL DATA:  Acute CVA. Evaluate for pneumonia. EXAM: PORTABLE CHEST 1 VIEW COMPARISON:  Radiograph 07/07/2019. Lung apices from CT angiography earlier today. FINDINGS: Ill-defined opacity in the right mid lung abutting the minor fissure. Mild cardiomegaly. Normal mediastinal contours. No pulmonary edema, large pleural effusion or pneumothorax. No acute osseous abnormalities are seen. IMPRESSION: Ill-defined opacity in the right mid lung abutting the minor fissure, suspicious for pneumonia. Mild cardiomegaly. Electronically Signed   By: Narda Rutherford M.D.   On: 02/14/2020 21:58   DG Chest Port 1V same Day  Result Date: 02/19/2020 CLINICAL DATA:  COVID-19 positive, shortness of breath EXAM: PORTABLE CHEST 1 VIEW COMPARISON:  02/14/2020 FINDINGS: Interval placement of enteric tube coursing below the diaphragm, distal tip beyond the inferior margin of the film. Cardiomediastinal contours are stable. Subtle opacity within the right mid lung is again seen abutting the minor fissure, slightly improved from prior. No new focal airspace consolidation. No pleural effusion or pneumothorax. IMPRESSION: 1. Interval placement of enteric tube, distal tip beyond the inferior margin of the film. 2. Slight interval improvement in right mid lung opacity. Electronically Signed   By: Duanne Guess D.O.   On: 02/19/2020 08:54   DG Abd Portable 1V  Result Date: 02/15/2020 CLINICAL DATA:  MRI clearance EXAM: PORTABLE ABDOMEN - 1 VIEW COMPARISON:  None. FINDINGS: The bowel gas pattern is normal. No radio-opaque calculi or other significant radiographic abnormality are seen. IMPRESSION: No radial pain metallic foreign body in the abdomen or pelvis. Electronically Signed   By: Elige Ko   On: 02/15/2020 05:57   EEG adult  Result Date: 02/19/2020 Charlsie Quest, MD     02/19/2020  6:27 PM Patient Name: Tyler Vargas MRN: [id number] Epilepsy Attending: Charlsie Quest Referring Physician/Provider: Dr Jeoffrey Massed Date: 02/19/2020 Duration: 23.23 mins Patient history: 33yo M with acute R MCA stroke as well as L basal ganglia/internal capsue stroke who continues to be encephalopathic. EEG to evaluate for seizure. Level of alertness: awake AEDs during EEG study: None Technical aspects: This EEG study was done with scalp electrodes positioned according to the 10-20 International system of electrode placement. Electrical activity was acquired at a sampling rate of 500Hz  and reviewed with a high frequency filter of 70Hz  and a low frequency filter of 1Hz . EEG data were recorded continuously and digitally stored. DESCRIPTION: The posterior dominant rhythm consists of 8 Hz activity of moderate voltage (25-35 uV) seen predominantly in posterior head regions, symmetric and reactive to eye opening and eye closing. EEG also showed intermittent generalized and maximal right frontotemporal region 3-6hz  theta-delta slowing. Hyperventilation and photic stimulation were not performed. ABNORMALITY - Intermittent slow, generalized and maximal right frontotemporal region IMPRESSION: This study is suggestive of cortical dysfunction in right frontotemporal region likely secondary to underlying stroke as well as mild diffuse encephalopathy, non specific to etiology. No seizures or epileptiform discharges were seen throughout the recording. Priyanka O Yadav   VAS [place] TRANSCRANIAL DOPPLER W BUBBLES  Result Date: 02/17/2020  Transcranial Doppler with Bubble Indications: PFO. Comparison Study: No prior studies. Performing Technologist: Chanda Busing RVT  Examination Guidelines: A complete evaluation includes B-mode imaging, spectral Doppler, color Doppler, and power Doppler as needed of all accessible portions of each vessel. Bilateral testing is considered an integral part of a complete examination. Limited examinations for reoccurring indications may be performed as noted.  Summary:  A vascular evaluation was performed. The right P2 was  studied. An IV was inserted into the patient's right  Brachial. Verbal informed consent was obtained. No HITS. Negative for PFO.  Negative TCD Bubble study .No indication of right to left shunt noted. *See table(s) above for TCD measurements and observations.  Diagnosing physician: Delia Heady MD Electronically signed by Delia Heady MD on 02/17/2020 at 12:36:13 PM.    Final    CT HEAD CODE STROKE WO CONTRAST  Result Date: 02/14/2020 CLINICAL DATA:  Code stroke.  Right-sided weakness. EXAM: CT HEAD WITHOUT CONTRAST TECHNIQUE: Contiguous axial images were obtained from the base of the skull through the vertex without intravenous contrast. COMPARISON:  03/07/2014 FINDINGS: Brain: There is an acute right basal ganglia infarct involving the caudate nucleus, lentiform nucleus, and a portion of the anterior limb of the right internal capsule. There is a subcentimeter focus of acute hemorrhage superficially in the right frontal operculum with a small amount of surrounding low-density, and there is also loss of gray-white differentiation indicative of an acute infarct in the anterior aspect of the right insula and more anterior aspect of the right frontal lobe. There is a new age indeterminate lacunar infarct in the genu of the left internal capsule. The ventricles are normal in size. There is no midline shift or extra-axial fluid collection. Vascular: Hyperdense right MCA. Skull: No fracture or suspicious osseous lesion. Sinuses/Orbits: Partially visualized small mucous retention cyst in the right maxillary sinus. Clear mastoid air cells. Rightward gaze. Other: None. ASPECTS [facility] Stroke Program Early CT Score) - Ganglionic level infarction (caudate, lentiform nuclei, internal capsule, insula, M1-M3 cortex): 1 - Supraganglionic infarction (M4-M6 cortex): 3 Total score (0-10 with 10 being normal): 4 IMPRESSION: 1. Acute right MCA infarct involving the frontal lobe, insula, and basal ganglia with small focus of  hemorrhage in the right frontal operculum. 2. ASPECTS is 4. 3. 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 These results were communicated to Dr. Laurence Slate at 7:29 pmon 02/14/2020 by text page via the [employer] messaging system. Electronically Signed   By: Sebastian Ache M.D.   On: 02/14/2020 19:30   VAS [place] UPPER EXTREMITY VENOUS DUPLEX  Result Date: 02/18/2020 UPPER VENOUS STUDY  Indications: Swelling Limitations: Patient positioning. Comparison Study: no prior Performing Technologist: Blanch Media RVS  Examination Guidelines: A complete evaluation includes B-mode imaging, spectral Doppler, color Doppler, and power Doppler as needed of all accessible portions of each vessel. Bilateral testing is considered an integral part of a complete examination. Limited examinations for reoccurring indications may be performed as noted.  Right Findings: +----------+------------+---------+-----------+----------+--------------+ RIGHT     CompressiblePhasicitySpontaneousProperties   Summary     +----------+------------+---------+-----------+----------+--------------+ IJV                                                 Not visualized +----------+------------+---------+-----------+----------+--------------+ Subclavian    Full       Yes       Yes                             +----------+------------+---------+-----------+----------+--------------+ Axillary      Full       Yes       Yes                             +----------+------------+---------+-----------+----------+--------------+ Brachial      Full  Yes       Yes                             +----------+------------+---------+-----------+----------+--------------+ Radial        Full                                                 +----------+------------+---------+-----------+----------+--------------+ Ulnar         Full                                                  +----------+------------+---------+-----------+----------+--------------+ Cephalic      Full                                                 +----------+------------+---------+-----------+----------+--------------+ Basilic       Full                                                 +----------+------------+---------+-----------+----------+--------------+  Summary:  Right: No evidence of deep vein thrombosis in the upper extremity. No evidence of superficial vein thrombosis in the upper extremity.  *See table(s) above for measurements and observations.  Diagnosing physician: Gretta Began MD Electronically signed by Gretta Began MD on 02/18/2020 at 6:41:36 PM.    Final    ECHOCARDIOGRAM LIMITED  Result Date: 02/15/2020    ECHOCARDIOGRAM LIMITED REPORT   Patient Name:   Tyler Vargas Date of Exam: 02/15/2020 Medical Rec #:  [id number]      Height:       68.0 in Accession #:    [id number]     Weight:       152.1 lb Date of Birth:  1986/04/26       BSA:          1.819 m Patient Age:    33 years       BP:           139/86 mmHg Patient Gender: M              HR:           78 bpm. Exam Location:  Inpatient Procedure: Limited Echo, Limited Color Doppler and Cardiac Doppler Indications:    Stroke 434.91 / I163.9                 Endocarditis I38  History:        Patient has no prior history of Echocardiogram examinations.                 HIV.  Sonographer:    Leta Jungling RDCS Referring Phys: [id number] TIMOTHY S OPYD IMPRESSIONS  1. Left ventricular ejection fraction, by estimation, is 55 to 60%. The left ventricle has normal function. The left ventricle has no regional wall motion abnormalities. Left ventricular diastolic parameters were normal.  2. The mitral valve is grossly normal. Trivial mitral valve regurgitation. No evidence of  mitral stenosis.  3. The aortic valve is tricuspid. Aortic valve regurgitation is not visualized. No aortic stenosis is present.  4. There is normal pulmonary artery systolic pressure.  The estimated right ventricular systolic pressure is 91.4 mmHg.  5. The inferior vena cava is normal in size with greater than 50% respiratory variability, suggesting right atrial pressure of 3 mmHg. Conclusion(s)/Recommendation(s): No evidence of valvular vegetations on this transthoracic echocardiogram. Would recommend a transesophageal echocardiogram to exclude infective endocarditis if clinically indicated. FINDINGS  Left Ventricle: Left ventricular ejection fraction, by estimation, is 55 to 60%. The left ventricle has normal function. The left ventricle has no regional wall motion abnormalities. The left ventricular internal cavity size was normal in size. There is  no left ventricular hypertrophy. Right Ventricle: There is normal pulmonary artery systolic pressure. The tricuspid regurgitant velocity is 2.29 m/s, and with an assumed right atrial pressure of 3 mmHg, the estimated right ventricular systolic pressure is 78.2 mmHg. Left Atrium: Left atrial size was normal in size. Right Atrium: Right atrial size was normal in size. Pericardium: A small pericardial effusion is present. The pericardial effusion is circumferential. Mitral Valve: The mitral valve is grossly normal. Trivial mitral valve regurgitation. No evidence of mitral valve stenosis. There is no evidence of mitral valve vegetation. Tricuspid Valve: The tricuspid valve is grossly normal. Tricuspid valve regurgitation is trivial. No evidence of tricuspid stenosis. There is no evidence of tricuspid valve vegetation. Aortic Valve: The aortic valve is tricuspid. Aortic valve regurgitation is not visualized. No aortic stenosis is present. There is no evidence of aortic valve vegetation. Pulmonic Valve: The pulmonic valve was grossly normal. Pulmonic valve regurgitation is trivial. No evidence of pulmonic stenosis. There is no evidence of pulmonic valve vegetation. Venous: The inferior vena cava is normal in size with greater than 50% respiratory  variability, suggesting right atrial pressure of 3 mmHg. IAS/Shunts: The atrial septum is grossly normal.  LEFT VENTRICLE PLAX 2D LVIDd:         5.00 cm      Diastology LVIDs:         3.50 cm      LV e' lateral:   12.70 cm/s LV PW:         1.00 cm      LV E/e' lateral: 5.0 LV IVS:        1.00 cm      LV e' medial:    12.50 cm/s                             LV E/e' medial:  5.1  LV Volumes (MOD) LV vol d, MOD A2C: 151.0 ml LV vol d, MOD A4C: 177.0 ml LV vol s, MOD A2C: 73.1 ml LV vol s, MOD A4C: 68.9 ml LV SV MOD A2C:     77.9 ml LV SV MOD A4C:     177.0 ml LV SV MOD BP:      94.8 ml LEFT ATRIUM             Index       RIGHT ATRIUM           Index LA diam:        3.30 cm 1.81 cm/m  RA Area:     14.30 cm LA Vol (A2C):   54.3 ml 29.85 ml/m RA Volume:   36.30 ml  19.95 ml/m LA Vol (A4C):   44.8 ml 24.62 ml/m LA Biplane  Vol: 53.9 ml 29.63 ml/m  AORTIC VALVE LVOT Vmax:   104.00 cm/s LVOT Vmean:  72.900 cm/s LVOT VTI:    0.168 m  AORTA Ao Root diam: 3.50 cm MITRAL VALVE               TRICUSPID VALVE MV Area (PHT): 2.66 cm    TR Peak grad:   21.0 mmHg MV Decel Time: 285 msec    TR Vmax:        229.00 cm/s MV E velocity: 63.40 cm/s MV A velocity: 75.80 cm/s  SHUNTS MV E/A ratio:  0.84        Systemic VTI: 0.17 m Lennie Odor MD Electronically signed by Lennie Odor MD Signature Date/Time: 02/15/2020/1:27:05 PM    Final

## 2020-02-24 NOTE — Progress Notes (Signed)
PMT provider follow-up. Initial palliative discussions by Dr. Linna Darner, PMT MD. Chart reviewed in detail. Patient's father, Emmaline Kluver unavailable this afternoon. Voicemail left. PMT provider will f/u tomorrow 4/7 for further GOC discussions. Thank you.  NO CHARGE  Vennie Homans, DNP, FNP-C Palliative Medicine Team  Phone: [phone] Fax: [phone]

## 2020-02-24 NOTE — Progress Notes (Signed)
Daily Progress Note   Tyler Vargas Name: Tyler Vargas       Date: 02/24/2020 DOB: 03/11/1986  Age: 34 y.o. MRN#: [id number] Attending Physician: Maretta Bees, MD Primary Care Physician: Tyler Vargas, No Pcp Per Admit Date: 02/14/2020  Reason for Consultation/Follow-up: Establishing goals of care and Terminal Care  Subjective: Per chart review, Tyler Vargas will open eyes but does not track or follow commands.   GOC:  F/u with Tyler Vargas's father Emmaline Kluver) via telephone. Emmaline Kluver shares that he has discussed with the Tyler Vargas's mother and sister and family has made decision to shift to comfort and pursue hospice facility. He speaks of not wishing to prolong this longer and confirms decision not to continue aggressive medical management including PEG tube placement. Family understands diagnoses, interventions, and poor prognosis.   Discussed hospice philosophy and focus on symptom management to ensure comfort and relief from suffering. Family interested in [facility] facility. Reassured of ongoing support from PMT and follow-up tomorrow to determine if [facility] will accept [place] since covid positive.   Explained that interventions not aimed at comfort will be discontinued. Father understands but does prefer to continue cortrak and tube feeds while Tyler Vargas is still hospitalized. Father understands that cortrak and tube feeds will be discontinued when transferred to hospice home.   Answered questions and concerns. Father has PMT contact information.  Length of Stay: 10  Current Medications: Scheduled Meds:  .  stroke: mapping our early stages of recovery book   Does not apply Once  . abacavir-dolutegravir-lamiVUDine  1 tablet Oral Daily  . aspirin  325 mg Per NG tube Daily  . enoxaparin  (LOVENOX) injection  40 mg Subcutaneous Q24H  . feeding supplement (PRO-STAT SUGAR FREE 64)  30 mL Per Tube BID  . influenza vac split quadrivalent PF  0.5 mL Intramuscular Tomorrow-1000  . pneumococcal 23 valent vaccine  0.5 mL Intramuscular Tomorrow-1000  . sulfamethoxazole-trimethoprim  20 mL Per Tube Daily    Continuous Infusions: . dextrose 5% lactated ringers 10 mL/hr at 02/21/20 0400  . feeding supplement (OSMOLITE 1.5 CAL) 1,000 mL (02/24/20 1135)  . vancomycin 1,000 mg (02/24/20 0847)    PRN Meds: acetaminophen **OR** acetaminophen (TYLENOL) oral liquid 160 mg/5 mL **OR** acetaminophen, senna-docusate  Physical Exam Vitals and nursing note reviewed.  Vital Signs: BP 113/81 (BP Location: Right Arm)   Pulse (!) 104   Temp 99 F (37.2 C) (Axillary)   Resp (!) 23   Ht 5\' 8"  (1.727 m)   Wt 64.8 kg   SpO2 100%   BMI 21.72 kg/m  SpO2: SpO2: 100 % O2 Device: O2 Device: Room Air O2 Flow Rate:    Intake/output summary:   Intake/Output Summary (Last 24 hours) at 02/24/2020 1648 Last data filed at 02/24/2020 1348 Gross per 24 hour  Intake 1720 ml  Output 1275 ml  Net 445 ml   LBM: Last BM Date: (pta) Baseline Weight: Weight: 67.7 kg Most recent weight: Weight: 64.8 kg       Palliative Assessment/Data: PPS 30%      Tyler Vargas Active Problem List   Diagnosis Date Noted  . Palliative care by specialist   . Goals of care, counseling/discussion   . General weakness   . Coagulase negative Staphylococcus bacteremia 02/21/2020  . COVID-19 02/16/2020  . Acute CVA (cerebrovascular accident) (HCC) 02/14/2020  . Cocaine use disorder, mild, abuse (HCC) 01/29/2020  . Psoriasis 09/30/2019  . 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 HIV (human immunodeficiency virus infection) (HCC) 09/30/2019  . AIDS (acquired immune deficiency syndrome) (HCC) 09/30/2019  . History of syphilis 09/30/2019  . Male-to-male transgender person 09/30/2019  . Depression 09/30/2019     Palliative Care Assessment & Plan   Tyler Vargas Profile: Tyler Vargas is 15 years old.  Lives at home with roommate.  Past medical history of HIV, syphilis, cocaine use.  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  Hospital course complicated by persistent encephalopathy, COVID-19 pneumonia and staph bacteremia.  Infectious disease specialist following.  Hospital course also significant for Tyler Vargas requiring core track tube and tube feedings.  A palliative medicine consultation has been requested for goals of care discussions with family.  Assessment: Acute right MCA CVA Dysphagia with cortrak/tube feeds Covid 19 viral pneumonia Acute metabolic encephalopathy ? Aseptic meningitis/encephalitis Staph bacteremia HIV/AIDS Hx of syphilis Hx of cocaine use  Recommendations/Plan:  Further GOC discussion with father 32) who has spoken with the rest of the family. Understanding diagnoses and poor prognosis, family does not wish to pursue ongoing aggressive medical management including PEG tube. Family is ready for shift to comfort measures and hospice facility placement.  TOC team notified. [facility]. Will need to see if they will accept with positive covid test on admit.   Comfort meds initiated. Discontinued interventions not aimed at comfort.  Father prefers to continue cortrak/tube feeds until discharge to hospice facility. Father understands this will be discontinued when transferred to hospice facility.   PMT will f/u in AM.  Code Status: DNR   Code Status Orders  (From admission, onward)         Start     Ordered   02/21/20 1130  Do not attempt resuscitation (DNR)  Continuous    Question Answer Comment  In the event of cardiac or respiratory ARREST Do not call a "code blue"   In the event of cardiac or respiratory ARREST Do not perform Intubation, CPR, defibrillation or ACLS   In the event of cardiac or respiratory ARREST  Use medication by any route, position, wound care, and other measures to relive pain and suffering. May use oxygen, suction and manual treatment of airway obstruction as needed for comfort.      02/21/20 1129        Code Status History  Date Active Date Inactive Code Status Order ID Comments User Context   02/14/2020 2231 02/21/2020 1129 Full Code [id number]  Vianne Bulls, MD ED   Advance Care Planning Activity       Prognosis:   < 2 weeks: once all life-prolonging interventions are discontinued.  Discharge Planning:  Hospice facility  Care plan was discussed with father, updated RN and Dr. Sloan Leiter via epic secure chat  Thank you for allowing the Palliative Medicine Team to assist in the care of this Tyler Vargas.  The above conversation was completed via telephone due to visitor restrictions during COVID-19 pandemic. Thorough chart review and discussion with multidisciplinary team was completed as part of assessment. No physical examination was performed.    Time In: 1640 Time Out: 1705 Total Time 25 Prolonged Time Billed no      Greater than 50%  of this time was spent counseling and coordinating care related to the above assessment and plan.  Ihor Dow, DNP, FNP-C Palliative Medicine Team  Phone: [phone] Fax: [phone]  Please contact Palliative Medicine Team phone at [phone] for questions and concerns.

## 2020-02-25 LAB — CULTURE, BLOOD (ROUTINE X 2): Special Requests: ADEQUATE

## 2020-02-25 MED ORDER — MORPHINE SULFATE (PF) 2 MG/ML IV SOLN: 1.0000 mg | INTRAVENOUS | Status: DC | PRN

## 2020-02-25 NOTE — Progress Notes (Signed)
Manufacturing systems engineer [facility]) Hospital Liaison note.   Paperwork completed. TOC can arrange transport to [facility]. RN please call report to [phone].   Thank you,   Elsie Saas, RN, CCM  [facility] Liaison (listed on [facility] under Hospice/[facility])  [phone]

## 2020-02-25 NOTE — Progress Notes (Signed)
Patient is leaving via [facility] now.  All belongings sent with patient.

## 2020-02-25 NOTE — Discharge Summary (Signed)
Physician Discharge Summary  Marily MemosGabriel Bower ZOX:[id number] DOB: 11/02/1986 DOA: 02/14/2020  PCP: Patient, No Pcp Per  Admit date: 02/14/2020 Discharge date: 02/25/2020  Admitted From: Home. Disposition: Inpatient hospice.    Discharge Condition: Serious CODE STATUS: Comfort care Diet recommendation: Comfort feeding  Discharge summary: 34 year old transgender (male to male) with past medical history of HIV, syphilis, cocaine use who presented to the emergency room with weakness/aphasia and found to have acute right MCA infarct.  Hospital course was complicated by persistent encephalopathy, dysphagia, COVID-19 pneumonia and coag negative bacteremia.  Patient was admitted to hospital and treated with aggressive measures, remained persistently encephalopathic.  He was also treated for COVID-19 infection with steroids and remdesivir.  He was treated with broad-spectrum antibiotics with vancomycin and penicillin G.  Patient remains aphasic.  He remained persistently encephalopathic.  Unable to eat.  No improvement of clinical status.  With her poor clinical condition, family agreed for comfort care measures and patient will be transferred to inpatient hospice facility today to provide terminal care. Patient will be medicated with comfort care medications before facility transfer.  Discharge Diagnoses:  Principal Problem:   Coagulase negative Staphylococcus bacteremia Active Problems:   AIDS (acquired immune deficiency syndrome) (HCC)   History of syphilis   Cocaine use disorder, mild, abuse (HCC)   Cerebrovascular accident (CVA) (HCC)   COVID-19   Palliative care by specialist   Terminal care   General weakness    Discharge Instructions  Discharge Instructions    Activity as tolerated - No restrictions   Complete by: As directed    Diet general   Complete by: As directed    Comfort feeding , aspiration precautions     Allergies as of 02/25/2020   No Known Allergies      Medication List    STOP taking these medications   ibuprofen 200 MG tablet Commonly known as: ADVIL   naproxen sodium 220 MG tablet Commonly known as: ALEVE   sulfamethoxazole-trimethoprim 800-160 MG tablet Commonly known as: BACTRIM DS     TAKE these medications   Biktarvy 50-200-25 MG Tabs tablet Generic drug: bictegravir-emtricitabine-tenofovir AF Take 1 tablet by mouth daily. What changed: additional instructions       No Known Allergies  Consultations:  Neuro  Palliative care medicine   Procedures/Studies: EEG  Result Date: 02/15/2020 Charlsie QuestYadav, Priyanka O, MD     02/15/2020  4:13 PM Patient Name: Marily MemosGabriel Stlouis MRN: [id number] Epilepsy Attending: Charlsie QuestPriyanka O Yadav Referring Physician/Provider: Dr Georgiana SpinnerSushanth Aroor Date: 02/15/2020 Duration: 23.26 mins Patient history: 33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 EEG to evaluate for seizure Level of alertness: asleep AEDs during EEG study: None Technical aspects: This EEG study was done with scalp electrodes positioned according to the 10-20 International system of electrode placement. Electrical activity was acquired at a sampling rate of 500Hz  and reviewed with a high frequency filter of 70Hz  and a low frequency filter of 1Hz . EEG data were recorded continuously and digitally stored. DESCRIPTION:  Sleep was characterized by vertex waves, sleep spindles, maximal frontocentral. Hyperventilation and photic stimulation were not performed. IMPRESSION: This study during sleep only is within normal limits. No seizures or epileptiform discharges were seen throughout the recording. If suspicion for interictal activity remains a concern, a prolonged study can be  considered. Priyanka Annabelle Harman   CT  ANGIO HEAD W OR WO CONTRAST  Result Date: 02/14/2020 CLINICAL DATA:  Stroke. Right-sided weakness and speech disturbance. EXAM: CT ANGIOGRAPHY HEAD AND NECK CT PERFUSION BRAIN TECHNIQUE: Multidetector CT imaging of the head and neck was performed using the standard protocol during bolus administration of intravenous contrast. Multiplanar CT image reconstructions and MIPs were obtained to evaluate the vascular anatomy. Carotid stenosis measurements (when applicable) are obtained utilizing NASCET criteria, using the distal internal carotid diameter as the denominator. Multiphase CT imaging of the brain was performed following IV bolus contrast injection. Subsequent parametric perfusion maps were calculated using RAPID software. CONTRAST:  40mL OMNIPAQUE IOHEXOL 350 MG/ML SOLN; 75mL OMNIPAQUE IOHEXOL 350 MG/ML SOLN COMPARISON:  None. FINDINGS: CTA NECK FINDINGS Aortic arch: Standard 3 vessel aortic arch with widely patent arch vessel origins. Right carotid system: Patent and smooth without evidence of stenosis or dissection. Left carotid system: Patent and smooth without evidence of stenosis or dissection. Vertebral arteries: Patent, smooth, and codominant without evidence of stenosis or dissection. Skeleton: Poor dentition with multiple periapical erosions. Other neck: Borderline enlarged level II lymph nodes bilaterally. Upper chest: Centrilobular emphysema. Small superior mediastinal lymph nodes. Review of the MIP images confirms the above findings CTA HEAD FINDINGS Anterior circulation: The internal carotid arteries are widely patent from skull base to carotid termini. The A1 and M1 segments are widely patent with the left A1 segment being mildly hypoplastic. There is a decreased number of branch vessels in the right MCA superior division with suggestion of occlusion and intermittent reconstitution of distal M2 and M3 branches. There is also multifocal irregular narrowing of M3 branches in the right MCA inferior  division, and there is mild left MCA branch vessel irregularity. No aneurysm is identified. Posterior circulation: The intracranial vertebral arteries are widely patent to the basilar. The basilar artery is widely patent. There are small posterior communicating arteries bilaterally. PCAs are patent with mild branch vessel irregularity more notable on the right without evidence of flow limiting proximal stenosis. No aneurysm is identified. Venous sinuses: Patent. Anatomic variants: None. Review of the MIP images confirms the above findings CT Brain Perfusion Findings: ASPECTS: 4 CBF (<30%) Volume: 0mL Perfusion (Tmax>6.0s) volume: 12mL Mismatch Volume: 12mL, however the core infarct on noncontrast head CT is not being picked up by the automated processing and the true penumbra is less than 12 mL Infarction Location: Right MCA IMPRESSION: 1. Multiple missing right MCA branch vessels in the superior division with wide patency of the M1 segment and no discrete proximal M2 occlusion identified. 2. Widely patent common carotid, internal carotid, and vertebral arteries. 3. CT perfusion demonstrating at most a small penumbra as detailed above. These results were called by telephone at the time of interpretation on 02/14/2020 at 7:55 p.m. to Dr. Laurence Slate, who verbally acknowledged these results. Electronically Signed   By: Sebastian Ache M.D.   On: 02/14/2020 20:22   CT HEAD WO CONTRAST  Result Date: 02/19/2020 CLINICAL DATA:  Stroke, follow-up. EXAM: CT HEAD WITHOUT CONTRAST TECHNIQUE: Contiguous axial images were obtained from the base of the skull through the vertex without intravenous contrast. COMPARISON:  Head CT 02/07/2020, brain MRI 02/15/2020 FINDINGS: Brain: Again demonstrated are, now subacute, infarcts within the right basal ganglia, right internal capsule, right frontal operculum, right insula and subinsular region as well as left basal/internal capsule. These infarcts have not significant changed in extent as  compared to prior head CT 02/15/2020. No significant mass effect. No midline  shift. No evidence of hemorrhagic conversion. No new demarcated infarct is identified. No evidence of intracranial mass. No extra-axial fluid collection. Stable, mild generalized parenchymal atrophy. Vascular: Redemonstrated hyperdense right MCA branch within the right sylvian fissure. Skull: Normal. Negative for fracture or focal lesion. Sinuses/Orbits: Visualized orbits demonstrate no acute abnormality. Mild ethmoid sinus mucosal thickening. No significant mastoid effusion at the imaged levels IMPRESSION: Subacute infarcts within the right MCA territory as well as left basal ganglia/internal capsule, unchanged in extent as compared to head CT 02/15/2020. No significant mass effect. No hemorrhagic conversion. No interval infarct is identified. Stable, mild generalized parenchymal atrophy. Mild ethmoid sinus mucosal thickening. Electronically Signed   By: Jackey Loge DO   On: 02/19/2020 10:33   CT HEAD WO CONTRAST  Result Date: 02/15/2020 CLINICAL DATA:  Stroke follow-up. COVID-19 infection. History of HIV. EXAM: CT HEAD WITHOUT CONTRAST TECHNIQUE: Contiguous axial images were obtained from the base of the skull through the vertex without intravenous contrast. COMPARISON:  Head MRI 02/15/2020 FINDINGS: Brain: Acute right MCA infarcts involving the basal ganglia, internal capsule, insula, and frontal operculum are unchanged as are small acute left basal ganglia and left internal capsule infarcts. A subcentimeter focus of hemorrhage in the right frontal operculum is unchanged. No new infarct, new intracranial hemorrhage, midline shift, or extra-axial fluid collection is identified. Mild ventricular prominence for age is unchanged and likely reflective of mild cerebral atrophy. Vascular: Hyperdense right MCA branch vessels in the sylvian fissure. Skull: No fracture or suspicious osseous lesion. Sinuses/Orbits: Mild right sphenoid sinus  mucosal thickening. Small right maxillary sinus mucous retention cyst. Clear mastoid air cells. Unremarkable orbits. Other: None. IMPRESSION: 1. Unchanged acute right MCA infarcts with unchanged small hemorrhage in the right frontal operculum. 2. Unchanged small acute left basal ganglia and internal capsule infarcts. 3. No new intracranial abnormality. Electronically Signed   By: Sebastian Ache M.D.   On: 02/15/2020 19:40   CT ANGIO NECK W OR WO CONTRAST  Result Date: 02/14/2020 CLINICAL DATA:  Stroke. Right-sided weakness and speech disturbance. EXAM: CT ANGIOGRAPHY HEAD AND NECK CT PERFUSION BRAIN TECHNIQUE: Multidetector CT imaging of the head and neck was performed using the standard protocol during bolus administration of intravenous contrast. Multiplanar CT image reconstructions and MIPs were obtained to evaluate the vascular anatomy. Carotid stenosis measurements (when applicable) are obtained utilizing NASCET criteria, using the distal internal carotid diameter as the denominator. Multiphase CT imaging of the brain was performed following IV bolus contrast injection. Subsequent parametric perfusion maps were calculated using RAPID software. CONTRAST:  40mL OMNIPAQUE IOHEXOL 350 MG/ML SOLN; 75mL OMNIPAQUE IOHEXOL 350 MG/ML SOLN COMPARISON:  None. FINDINGS: CTA NECK FINDINGS Aortic arch: Standard 3 vessel aortic arch with widely patent arch vessel origins. Right carotid system: Patent and smooth without evidence of stenosis or dissection. Left carotid system: Patent and smooth without evidence of stenosis or dissection. Vertebral arteries: Patent, smooth, and codominant without evidence of stenosis or dissection. Skeleton: Poor dentition with multiple periapical erosions. Other neck: Borderline enlarged level II lymph nodes bilaterally. Upper chest: Centrilobular emphysema. Small superior mediastinal lymph nodes. Review of the MIP images confirms the above findings CTA HEAD FINDINGS Anterior circulation: The  internal carotid arteries are widely patent from skull base to carotid termini. The A1 and M1 segments are widely patent with the left A1 segment being mildly hypoplastic. There is a decreased number of branch vessels in the right MCA superior division with suggestion of occlusion and intermittent reconstitution of distal M2 and M3  branches. There is also multifocal irregular narrowing of M3 branches in the right MCA inferior division, and there is mild left MCA branch vessel irregularity. No aneurysm is identified. Posterior circulation: The intracranial vertebral arteries are widely patent to the basilar. The basilar artery is widely patent. There are small posterior communicating arteries bilaterally. PCAs are patent with mild branch vessel irregularity more notable on the right without evidence of flow limiting proximal stenosis. No aneurysm is identified. Venous sinuses: Patent. Anatomic variants: None. Review of the MIP images confirms the above findings CT Brain Perfusion Findings: ASPECTS: 4 CBF (<30%) Volume: 21mL Perfusion (Tmax>6.0s) volume: 33mL Mismatch Volume: 70mL, however the core infarct on noncontrast head CT is not being picked up by the automated processing and the true penumbra is less than 12 mL Infarction Location: Right MCA IMPRESSION: 1. Multiple missing right MCA branch vessels in the superior division with wide patency of the M1 segment and no discrete proximal M2 occlusion identified. 2. Widely patent common carotid, internal carotid, and vertebral arteries. 3. CT perfusion demonstrating at most a small penumbra as detailed above. These results were called by telephone at the time of interpretation on 02/14/2020 at 7:55 p.m. to Dr. Lorraine Lax, who verbally acknowledged these results. Electronically Signed   By: Logan Bores M.D.   On: 02/14/2020 20:22   MR BRAIN WO CONTRAST  Result Date: 02/15/2020 CLINICAL DATA:  Stroke.  HIV. EXAM: MRI HEAD WITHOUT CONTRAST TECHNIQUE: Multiplanar,  multiecho pulse sequences of the brain and surrounding structures were obtained without intravenous contrast. COMPARISON:  CT head 02/14/2020 FINDINGS: Brain: Acute right MCA infarct involving the right lenticular nucleus as well as the right frontal operculum and right parietal operculum. Small area of acute infarction in the genu internal capsule on the right. Small area of associated hemorrhage in the right frontal operculum and right parietal operculum. Small areas of acute infarct in the left basal ganglia involving the genu internal capsule and posterior external capsule fibers. Ventricle size normal.  No midline shift.  Negative for mass lesion. Vascular: Normal arterial flow voids. Skull and upper cervical spine: No focal skeletal lesion. Sinuses/Orbits: Mild mucosal edema paranasal sinuses. Negative orbit Other: None IMPRESSION: Acute infarct right MCA territory involving the basal ganglia as well as the right frontal and parietal operculum where there are small areas of associated hemorrhage. Small area of acute infarct in the left internal capsule and external capsule. Electronically Signed   By: Franchot Gallo M.D.   On: 02/15/2020 07:27   CT CEREBRAL PERFUSION W CONTRAST  Result Date: 02/14/2020 CLINICAL DATA:  Stroke. Right-sided weakness and speech disturbance. EXAM: CT ANGIOGRAPHY HEAD AND NECK CT PERFUSION BRAIN TECHNIQUE: Multidetector CT imaging of the head and neck was performed using the standard protocol during bolus administration of intravenous contrast. Multiplanar CT image reconstructions and MIPs were obtained to evaluate the vascular anatomy. Carotid stenosis measurements (when applicable) are obtained utilizing NASCET criteria, using the distal internal carotid diameter as the denominator. Multiphase CT imaging of the brain was performed following IV bolus contrast injection. Subsequent parametric perfusion maps were calculated using RAPID software. CONTRAST:  26mL OMNIPAQUE IOHEXOL  350 MG/ML SOLN; 21mL OMNIPAQUE IOHEXOL 350 MG/ML SOLN COMPARISON:  None. FINDINGS: CTA NECK FINDINGS Aortic arch: Standard 3 vessel aortic arch with widely patent arch vessel origins. Right carotid system: Patent and smooth without evidence of stenosis or dissection. Left carotid system: Patent and smooth without evidence of stenosis or dissection. Vertebral arteries: Patent, smooth, and codominant without evidence of  stenosis or dissection. Skeleton: Poor dentition with multiple periapical erosions. Other neck: Borderline enlarged level II lymph nodes bilaterally. Upper chest: Centrilobular emphysema. Small superior mediastinal lymph nodes. Review of the MIP images confirms the above findings CTA HEAD FINDINGS Anterior circulation: The internal carotid arteries are widely patent from skull base to carotid termini. The A1 and M1 segments are widely patent with the left A1 segment being mildly hypoplastic. There is a decreased number of branch vessels in the right MCA superior division with suggestion of occlusion and intermittent reconstitution of distal M2 and M3 branches. There is also multifocal irregular narrowing of M3 branches in the right MCA inferior division, and there is mild left MCA branch vessel irregularity. No aneurysm is identified. Posterior circulation: The intracranial vertebral arteries are widely patent to the basilar. The basilar artery is widely patent. There are small posterior communicating arteries bilaterally. PCAs are patent with mild branch vessel irregularity more notable on the right without evidence of flow limiting proximal stenosis. No aneurysm is identified. Venous sinuses: Patent. Anatomic variants: None. Review of the MIP images confirms the above findings CT Brain Perfusion Findings: ASPECTS: 4 CBF (<30%) Volume: 0mL Perfusion (Tmax>6.0s) volume: 12mL Mismatch Volume: 12mL, however the core infarct on noncontrast head CT is not being picked up by the automated processing and the  true penumbra is less than 12 mL Infarction Location: Right MCA IMPRESSION: 1. Multiple missing right MCA branch vessels in the superior division with wide patency of the M1 segment and no discrete proximal M2 occlusion identified. 2. Widely patent common carotid, internal carotid, and vertebral arteries. 3. CT perfusion demonstrating at most a small penumbra as detailed above. These results were called by telephone at the time of interpretation on 02/14/2020 at 7:55 p.m. to Dr. Laurence Slate, who verbally acknowledged these results. Electronically Signed   By: Sebastian Ache M.D.   On: 02/14/2020 20:22   DG Chest Port 1 View  Result Date: 02/14/2020 CLINICAL DATA:  Acute CVA. Evaluate for pneumonia. EXAM: PORTABLE CHEST 1 VIEW COMPARISON:  Radiograph 07/07/2019. Lung apices from CT angiography earlier today. FINDINGS: Ill-defined opacity in the right mid lung abutting the minor fissure. Mild cardiomegaly. Normal mediastinal contours. No pulmonary edema, large pleural effusion or pneumothorax. No acute osseous abnormalities are seen. IMPRESSION: Ill-defined opacity in the right mid lung abutting the minor fissure, suspicious for pneumonia. Mild cardiomegaly. Electronically Signed   By: Narda Rutherford M.D.   On: 02/14/2020 21:58   DG Chest Port 1V same Day  Result Date: 02/19/2020 CLINICAL DATA:  COVID-19 positive, shortness of breath EXAM: PORTABLE CHEST 1 VIEW COMPARISON:  02/14/2020 FINDINGS: Interval placement of enteric tube coursing below the diaphragm, distal tip beyond the inferior margin of the film. Cardiomediastinal contours are stable. Subtle opacity within the right mid lung is again seen abutting the minor fissure, slightly improved from prior. No new focal airspace consolidation. No pleural effusion or pneumothorax. IMPRESSION: 1. Interval placement of enteric tube, distal tip beyond the inferior margin of the film. 2. Slight interval improvement in right mid lung opacity. Electronically Signed   By:  Duanne Guess D.O.   On: 02/19/2020 08:54   DG Abd Portable 1V  Result Date: 02/15/2020 CLINICAL DATA:  MRI clearance EXAM: PORTABLE ABDOMEN - 1 VIEW COMPARISON:  None. FINDINGS: The bowel gas pattern is normal. No radio-opaque calculi or other significant radiographic abnormality are seen. IMPRESSION: No radial pain metallic foreign body in the abdomen or pelvis. Electronically Signed   By: Alan Ripper  Patel   On: 02/15/2020 05:57   EEG adult  Result Date: 02/19/2020 Charlsie Quest, MD     02/19/2020  6:27 PM Patient Name: Arlon Bleier MRN: [id number] Epilepsy Attending: Charlsie Quest Referring Physician/Provider: Dr Jeoffrey Massed Date: 02/19/2020 Duration: 23.23 mins Patient history: 33yo M with acute R MCA stroke as well as L basal ganglia/internal capsue stroke who continues to be encephalopathic. EEG to evaluate for seizure. Level of alertness: awake AEDs during EEG study: None Technical aspects: This EEG study was done with scalp electrodes positioned according to the 10-20 International system of electrode placement. Electrical activity was acquired at a sampling rate of  and reviewed with a high frequency filter of  and a low frequency filter of . EEG data were recorded continuously and digitally stored. DESCRIPTION: The posterior dominant rhythm consists of 8 Hz activity of moderate voltage (25-35 uV) seen predominantly in posterior head regions, symmetric and reactive to eye opening and eye closing. EEG also showed intermittent generalized and maximal right frontotemporal region 3-6hz  theta-delta slowing. Hyperventilation and photic stimulation were not performed. ABNORMALITY - Intermittent slow, generalized and maximal right frontotemporal region IMPRESSION: This study is suggestive of cortical dysfunction in right frontotemporal region likely secondary to underlying stroke as well as mild diffuse encephalopathy, non specific to etiology. No seizures or epileptiform discharges were  seen throughout the recording. Priyanka O Yadav   VAS [place] TRANSCRANIAL DOPPLER W BUBBLES  Result Date: 02/17/2020  Transcranial Doppler with Bubble Indications: PFO. Comparison Study: No prior studies. Performing Technologist: Chanda Busing RVT  Examination Guidelines: A complete evaluation includes B-mode imaging, spectral Doppler, color Doppler, and power Doppler as needed of all accessible portions of each vessel. Bilateral testing is considered an integral part of a complete examination. Limited examinations for reoccurring indications may be performed as noted.  Summary:  A vascular evaluation was performed. The right P2 was studied. An IV was inserted into the patient's right Brachial. Verbal informed consent was obtained. No HITS. Negative for PFO.  Negative TCD Bubble study .No indication of right to left shunt noted. *See table(s) above for TCD measurements and observations.  Diagnosing physician: Delia Heady MD Electronically signed by Delia Heady MD on 02/17/2020 at 12:36:13 PM.    Final    CT HEAD CODE STROKE WO CONTRAST  Result Date: 02/14/2020 CLINICAL DATA:  Code stroke.  Right-sided weakness. EXAM: CT HEAD WITHOUT CONTRAST TECHNIQUE: Contiguous axial images were obtained from the base of the skull through the vertex without intravenous contrast. COMPARISON:  03/07/2014 FINDINGS: Brain: There is an acute right basal ganglia infarct involving the caudate nucleus, lentiform nucleus, and a portion of the anterior limb of the right internal capsule. There is a subcentimeter focus of acute hemorrhage superficially in the right frontal operculum with a small amount of surrounding low-density, and there is also loss of gray-white differentiation indicative of an acute infarct in the anterior aspect of the right insula and more anterior aspect of the right frontal lobe. There is a new age indeterminate lacunar infarct in the genu of the left internal capsule. The ventricles are normal in size. There  is no midline shift or extra-axial fluid collection. Vascular: Hyperdense right MCA. Skull: No fracture or suspicious osseous lesion. Sinuses/Orbits: Partially visualized small mucous retention cyst in the right maxillary sinus. Clear mastoid air cells. Rightward gaze. Other: None. ASPECTS [facility] Stroke Program Early CT Score) - Ganglionic level infarction (caudate, lentiform nuclei, internal capsule, insula, M1-M3 cortex): 1 - Supraganglionic infarction (M4-M6  cortex): 3 Total score (0-10 with 10 being normal): 4 IMPRESSION: 1. 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 2. ASPECTS is 4. 3. 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 These results were communicated to Dr. Laurence Slate at 7:29 pmon 02/14/2020 by text page via the [facility] messaging system. Electronically Signed   By: Sebastian Ache M.D.   On: 02/14/2020 19:30   VAS [place] UPPER EXTREMITY VENOUS DUPLEX  Result Date: 02/18/2020 UPPER VENOUS STUDY  Indications: Swelling Limitations: Patient positioning. Comparison Study: no prior Performing Technologist: Blanch Media RVS  Examination Guidelines: A complete evaluation includes B-mode imaging, spectral Doppler, color Doppler, and power Doppler as needed of all accessible portions of each vessel. Bilateral testing is considered an integral part of a complete examination. Limited examinations for reoccurring indications may be performed as noted.  Right Findings: +----------+------------+---------+-----------+----------+--------------+ RIGHT     CompressiblePhasicitySpontaneousProperties   Summary     +----------+------------+---------+-----------+----------+--------------+ IJV                                                 Not visualized +----------+------------+---------+-----------+----------+--------------+ Subclavian    Full       Yes       Yes                              +----------+------------+---------+-----------+----------+--------------+ Axillary      Full       Yes       Yes                             +----------+------------+---------+-----------+----------+--------------+ Brachial      Full       Yes       Yes                             +----------+------------+---------+-----------+----------+--------------+ Radial        Full                                                 +----------+------------+---------+-----------+----------+--------------+ Ulnar         Full                                                 +----------+------------+---------+-----------+----------+--------------+ Cephalic      Full                                                 +----------+------------+---------+-----------+----------+--------------+ Basilic       Full                                                 +----------+------------+---------+-----------+----------+--------------+  Summary:  Right: No evidence of deep vein thrombosis in the upper extremity. No evidence of superficial vein thrombosis in the upper extremity.  *See table(s) above for measurements and observations.  Diagnosing physician: Gretta Began MD Electronically signed by Gretta Began MD on 02/18/2020 at 6:41:36 PM.    Final    ECHOCARDIOGRAM LIMITED  Result Date: 02/15/2020    ECHOCARDIOGRAM LIMITED REPORT   Patient Name:   CARLITOS BOTTINO Date of Exam: 02/15/2020 Medical Rec #:  [id number]      Height:       68.0 in Accession #:    [id number]     Weight:       152.1 lb Date of Birth:  07-22-1986       BSA:          1.819 m Patient Age:    33 years       BP:           139/86 mmHg Patient Gender: M              HR:           78 bpm. Exam Location:  Inpatient Procedure: Limited Echo, Limited Color Doppler and Cardiac Doppler Indications:    Stroke 434.91 / I163.9                 Endocarditis I38  History:        Patient has no prior history of Echocardiogram examinations.                  HIV.  Sonographer:    Leta Jungling RDCS Referring Phys: [id number] TIMOTHY S OPYD IMPRESSIONS  1. Left ventricular ejection fraction, by estimation, is 55 to 60%. The left ventricle has normal function. The left ventricle has no regional wall motion abnormalities. Left ventricular diastolic parameters were normal.  2. The mitral valve is grossly normal. Trivial mitral valve regurgitation. No evidence of mitral stenosis.  3. The aortic valve is tricuspid. Aortic valve regurgitation is not visualized. No aortic stenosis is present.  4. There is normal pulmonary artery systolic pressure. The estimated right ventricular systolic pressure is 24.0 mmHg.  5. The inferior vena cava is normal in size with greater than 50% respiratory variability, suggesting right atrial pressure of 3 mmHg. Conclusion(s)/Recommendation(s): No evidence of valvular vegetations on this transthoracic echocardiogram. Would recommend a transesophageal echocardiogram to exclude infective endocarditis if clinically indicated. FINDINGS  Left Ventricle: Left ventricular ejection fraction, by estimation, is 55 to 60%. The left ventricle has normal function. The left ventricle has no regional wall motion abnormalities. The left ventricular internal cavity size was normal in size. There is  no left ventricular hypertrophy. Right Ventricle: There is normal pulmonary artery systolic pressure. The tricuspid regurgitant velocity is 2.29 m/s, and with an assumed right atrial pressure of 3 mmHg, the estimated right ventricular systolic pressure is 24.0 mmHg. Left Atrium: Left atrial size was normal in size. Right Atrium: Right atrial size was normal in size. Pericardium: A small pericardial effusion is present. The pericardial effusion is circumferential. Mitral Valve: The mitral valve is grossly normal. Trivial mitral valve regurgitation. No evidence of mitral valve stenosis. There is no evidence of mitral valve vegetation. Tricuspid Valve: The tricuspid valve  is grossly normal. Tricuspid valve regurgitation is trivial. No evidence of tricuspid stenosis. There is no evidence of tricuspid valve vegetation. Aortic Valve: The aortic valve is tricuspid. Aortic valve regurgitation is not visualized. No aortic stenosis is present. There is no  evidence of aortic valve vegetation. Pulmonic Valve: The pulmonic valve was grossly normal. Pulmonic valve regurgitation is trivial. No evidence of pulmonic stenosis. There is no evidence of pulmonic valve vegetation. Venous: The inferior vena cava is normal in size with greater than 50% respiratory variability, suggesting right atrial pressure of 3 mmHg. IAS/Shunts: The atrial septum is grossly normal.  LEFT VENTRICLE PLAX 2D LVIDd:         5.00 cm      Diastology LVIDs:         3.50 cm      LV e' lateral:   12.70 cm/s LV PW:         1.00 cm      LV E/e' lateral: 5.0 LV IVS:        1.00 cm      LV e' medial:    12.50 cm/s                             LV E/e' medial:  5.1  LV Volumes (MOD) LV vol d, MOD A2C: 151.0 ml LV vol d, MOD A4C: 177.0 ml LV vol s, MOD A2C: 73.1 ml LV vol s, MOD A4C: 68.9 ml LV SV MOD A2C:     77.9 ml LV SV MOD A4C:     177.0 ml LV SV MOD BP:      94.8 ml LEFT ATRIUM             Index       RIGHT ATRIUM           Index LA diam:        3.30 cm 1.81 cm/m  RA Area:     14.30 cm LA Vol (A2C):   54.3 ml 29.85 ml/m RA Volume:   36.30 ml  19.95 ml/m LA Vol (A4C):   44.8 ml 24.62 ml/m LA Biplane Vol: 53.9 ml 29.63 ml/m  AORTIC VALVE LVOT Vmax:   104.00 cm/s LVOT Vmean:  72.900 cm/s LVOT VTI:    0.168 m  AORTA Ao Root diam: 3.50 cm MITRAL VALVE               TRICUSPID VALVE MV Area (PHT): 2.66 cm    TR Peak grad:   21.0 mmHg MV Decel Time: 285 msec    TR Vmax:        229.00 cm/s MV E velocity: 63.40 cm/s MV A velocity: 75.80 cm/s  SHUNTS MV E/A ratio:  0.84        Systemic VTI: 0.17 m Lennie Odor MD Electronically signed by Lennie Odor MD Signature Date/Time: 02/15/2020/1:27:05 PM    Final       Subjective:  Patient seen and examined.  Remains unresponsive.  No meaningful response.   Discharge Exam: Vitals:   02/24/20 1633 02/24/20 1952  BP: 113/81 109/73  Pulse: (!) 104 (!) 111  Resp: (!) 23 (!) 27  Temp: 99 F (37.2 C) 99.7 F (37.6 C)  SpO2: 100% 100%   Vitals:   02/24/20 0740 02/24/20 1128 02/24/20 1633 02/24/20 1952  BP: 115/76 111/78 113/81 109/73  Pulse: (!) 106 (!) 103 (!) 104 (!) 111  Resp: (!) 21 (!) 25 (!) 23 (!) 27  Temp: (!) 101.1 F (38.4 C) 99 F (37.2 C) 99 F (37.2 C) 99.7 F (37.6 C)  TempSrc: Axillary Axillary Axillary Axillary  SpO2: 95% 97% 100% 100%  Weight:      Height:  Physical exam : Physical Exam Unable to talk.  Opens eyes at times, not tracking or not following commands. Looks comfortable with comfort medications. Withdraws lower extremities to pain, otherwise does not have any purposeful movements. Sick looking, on room air.   The results of significant diagnostics from this hospitalization (including imaging, microbiology, ancillary and laboratory) are listed below for reference.     Microbiology: Recent Results (from the past 240 hour(s))  Culture, blood (routine x 2)     Status: Abnormal   Collection Time: 02/19/20  7:39 AM   Specimen: BLOOD  Result Value Ref Range Status   Specimen Description BLOOD RIGHT ANTECUBITAL  Final   Special Requests   Final    BOTTLES DRAWN AEROBIC AND ANAEROBIC Blood Culture adequate volume   Culture  Setup Time   Final    GRAM POSITIVE COCCI IN CLUSTERS IN BOTH AEROBIC AND ANAEROBIC BOTTLES Organism ID to follow CRITICAL RESULT CALLED TO, READ BACK BY AND VERIFIED WITHColin Rhein PHARMD 2148 02/20/20 A BROWNING Performed at [facility], [address] [place], [place] [address]    Culture (A)  Final    STAPHYLOCOCCUS SIMULANS CORRECTED ON 04/04 AT 1031: PREVIOUSLY REPORTED AS STAPHYLOCOCCUS SPECIES (COAGULASE NEGATIVE)   Report Status 02/23/2020 FINAL  Final   Organism ID, Bacteria  STAPHYLOCOCCUS SIMULANS  Final      Susceptibility   Staphylococcus simulans - MIC*    CIPROFLOXACIN <=0.5 SENSITIVE Sensitive     ERYTHROMYCIN >=8 RESISTANT Resistant     GENTAMICIN <=0.5 SENSITIVE Sensitive     OXACILLIN >=4 RESISTANT Resistant     TETRACYCLINE >=16 RESISTANT Resistant     VANCOMYCIN <=0.5 SENSITIVE Sensitive     TRIMETH/SULFA 160 RESISTANT Resistant     CLINDAMYCIN RESISTANT Resistant     RIFAMPIN <=0.5 SENSITIVE Sensitive     Inducible Clindamycin POSITIVE Resistant     * STAPHYLOCOCCUS SIMULANS CORRECTED ON 04/04 AT 1031: PREVIOUSLY REPORTED AS STAPHYLOCOCCUS SPECIES (COAGULASE NEGATIVE)  Blood Culture ID Panel (Reflexed)     Status: Abnormal   Collection Time: 02/19/20  7:39 AM  Result Value Ref Range Status   Enterococcus species NOT DETECTED NOT DETECTED Final   Listeria monocytogenes NOT DETECTED NOT DETECTED Final   Staphylococcus species DETECTED (A) NOT DETECTED Final    Comment: Methicillin (oxacillin) resistant coagulase negative staphylococcus. Possible blood culture contaminant (unless isolated from more than one blood culture draw or clinical case suggests pathogenicity). No antibiotic treatment is indicated for blood  culture contaminants. CRITICAL RESULT CALLED TO, READ BACK BY AND VERIFIED WITH: B MANCHERIL PHARMD 2148 02/20/20 A BROWNING    Staphylococcus aureus (BCID) NOT DETECTED NOT DETECTED Final   Methicillin resistance DETECTED (A) NOT DETECTED Final    Comment: CRITICAL RESULT CALLED TO, READ BACK BY AND VERIFIED WITH: B MANCHERIL PHARMD 2148 02/20/20 A BROWNING    Streptococcus species NOT DETECTED NOT DETECTED Final   Streptococcus agalactiae NOT DETECTED NOT DETECTED Final   Streptococcus pneumoniae NOT DETECTED NOT DETECTED Final   Streptococcus pyogenes NOT DETECTED NOT DETECTED Final   Acinetobacter baumannii NOT DETECTED NOT DETECTED Final   Enterobacteriaceae species NOT DETECTED NOT DETECTED Final   Enterobacter cloacae complex NOT  DETECTED NOT DETECTED Final   Escherichia coli NOT DETECTED NOT DETECTED Final   Klebsiella oxytoca NOT DETECTED NOT DETECTED Final   Klebsiella pneumoniae NOT DETECTED NOT DETECTED Final   Proteus species NOT DETECTED NOT DETECTED Final   Serratia marcescens NOT DETECTED NOT DETECTED Final  Haemophilus influenzae NOT DETECTED NOT DETECTED Final   Neisseria meningitidis NOT DETECTED NOT DETECTED Final   Pseudomonas aeruginosa NOT DETECTED NOT DETECTED Final   Candida albicans NOT DETECTED NOT DETECTED Final   Candida glabrata NOT DETECTED NOT DETECTED Final   Candida krusei NOT DETECTED NOT DETECTED Final   Candida parapsilosis NOT DETECTED NOT DETECTED Final   Candida tropicalis NOT DETECTED NOT DETECTED Final    Comment: Performed at [facility], [address] [place], [place] [address]  Culture, blood (routine x 2)     Status: Abnormal   Collection Time: 02/19/20  7:47 AM   Specimen: BLOOD  Result Value Ref Range Status   Specimen Description BLOOD RIGHT ANTECUBITAL  Final   Special Requests   Final    BOTTLES DRAWN AEROBIC AND ANAEROBIC Blood Culture adequate volume   Culture  Setup Time   Final    GRAM POSITIVE COCCI IN CLUSTERS IN BOTH AEROBIC AND ANAEROBIC BOTTLES CRITICAL RESULT CALLED TO, READ BACK BY AND VERIFIED WITH: Janice Norrie, PHARMD AT 1908 ON 02/20/20 BY C. JESSUP, MT Performed at [facility], [address] [place], [place] [address]    Culture STAPHYLOCOCCUS EPIDERMIDIS (A)  Final   Report Status 02/23/2020 FINAL  Final   Organism ID, Bacteria STAPHYLOCOCCUS EPIDERMIDIS  Final      Susceptibility   Staphylococcus epidermidis - MIC*    CIPROFLOXACIN <=0.5 SENSITIVE Sensitive     ERYTHROMYCIN >=8 RESISTANT Resistant     GENTAMICIN <=0.5 SENSITIVE Sensitive     OXACILLIN 1 RESISTANT Resistant     TETRACYCLINE <=1 SENSITIVE Sensitive     VANCOMYCIN <=0.5 SENSITIVE Sensitive     TRIMETH/SULFA 20 SENSITIVE Sensitive     CLINDAMYCIN >=8 RESISTANT  Resistant     RIFAMPIN <=0.5 SENSITIVE Sensitive     Inducible Clindamycin NEGATIVE Sensitive     * STAPHYLOCOCCUS EPIDERMIDIS  Culture, blood (routine x 2)     Status: Abnormal   Collection Time: 02/22/20  7:02 AM   Specimen: BLOOD  Result Value Ref Range Status   Specimen Description BLOOD LEFT ANTECUBITAL  Final   Special Requests   Final    BOTTLES DRAWN AEROBIC AND ANAEROBIC Blood Culture adequate volume   Culture  Setup Time   Final    GRAM POSITIVE COCCI IN BOTH AEROBIC AND ANAEROBIC BOTTLES CRITICAL RESULT CALLED TO, READ BACK BY AND VERIFIED WITH: PHRMD J LEDFORD @0456  02/23/20 BY S GEZAHEGN    Culture (A)  Final    STAPHYLOCOCCUS HOMINIS STAPHYLOCOCCUS EPIDERMIDIS SUSCEPTIBILITIES PERFORMED ON PREVIOUS CULTURE WITHIN THE LAST 5 DAYS. Performed at [facility], [address], [place], [place] [address]    Report Status 02/25/2020 FINAL  Final  Culture, blood (routine x 2)     Status: None (Preliminary result)   Collection Time: 02/22/20  7:11 AM   Specimen: BLOOD  Result Value Ref Range Status   Specimen Description BLOOD LEFT ARM  Final   Special Requests   Final    BOTTLES DRAWN AEROBIC AND ANAEROBIC Blood Culture adequate volume   Culture   Final    NO GROWTH 3 DAYS Performed at [facility], [address] [place], [place] [address]    Report Status PENDING  Incomplete     Labs: BNP (last 3 results) No results for input(s): BNP in the last 8760 hours. Basic Metabolic Panel: Recent Labs  Lab 02/19/20 0450 02/20/20 0541 02/21/20 0343 02/23/20 0253  NA  136 135 135 129*  K 3.6 3.9 4.2 4.4  CL 101 100 101 95*  CO2 23 26 22 22   GLUCOSE 136* 106* 107* 133*  BUN 13 16 19 20   CREATININE 0.82 0.98 0.90 0.82  CALCIUM 8.4* 8.5* 8.8* 8.8*   Liver Function Tests: Recent Labs  Lab 02/19/20 0450 02/20/20 0541 02/21/20 0343 02/23/20 0253  AST 60* 81* 55* 64*  ALT 29 32 31 28  ALKPHOS 57 72 71 69  BILITOT 0.3 0.5 0.3 0.3  PROT 7.5 8.0 8.4*  9.4*  ALBUMIN 1.9* 2.1* 2.2* 2.3*   No results for input(s): LIPASE, AMYLASE in the last 168 hours. No results for input(s): AMMONIA in the last 168 hours. CBC: Recent Labs  Lab 02/19/20 0450 02/19/20 1431 02/20/20 0541 02/21/20 0343 02/23/20 0253  WBC 8.2 12.7* 9.0 4.4 5.5  NEUTROABS  --  11.6*  --   --   --   HGB 11.1* 11.3* 11.3* 11.7* 12.2*  HCT 34.6* 35.2* 35.8* 36.2* 37.5*  MCV 90.8 91.7 92.7 90.7 90.6  PLT 690* 710* 684* 581* 536*   Cardiac Enzymes: No results for input(s): CKTOTAL, CKMB, CKMBINDEX, TROPONINI in the last 168 hours. BNP: Invalid input(s): POCBNP CBG: Recent Labs  Lab 02/24/20 0415 02/24/20 0751 02/24/20 1143 02/24/20 1637 02/24/20 2319  GLUCAP 102* 123* 125* 121* 126*   D-Dimer No results for input(s): DDIMER in the last 72 hours. Hgb A1c No results for input(s): HGBA1C in the last 72 hours. Lipid Profile No results for input(s): CHOL, HDL, LDLCALC, TRIG, CHOLHDL, LDLDIRECT in the last 72 hours. Thyroid function studies No results for input(s): TSH, T4TOTAL, T3FREE, THYROIDAB in the last 72 hours.  Invalid input(s): FREET3 Anemia work up No results for input(s): VITAMINB12, FOLATE, FERRITIN, TIBC, IRON, RETICCTPCT in the last 72 hours. Urinalysis    Component Value Date/Time   COLORURINE STRAW (A) 02/14/2020 2146   APPEARANCEUR CLEAR 02/14/2020 2146   LABSPEC >1.046 (H) 02/14/2020 2146   PHURINE 7.0 02/14/2020 2146   GLUCOSEU NEGATIVE 02/14/2020 2146   HGBUR SMALL (A) 02/14/2020 2146   BILIRUBINUR NEGATIVE 02/14/2020 2146   KETONESUR NEGATIVE 02/14/2020 2146   PROTEINUR NEGATIVE 02/14/2020 2146   UROBILINOGEN 0.2 03/07/2014 0743   NITRITE NEGATIVE 02/14/2020 2146   LEUKOCYTESUR NEGATIVE 02/14/2020 2146   Sepsis Labs Invalid input(s): PROCALCITONIN,  WBC,  LACTICIDVEN Microbiology Recent Results (from the past 240 hour(s))  Culture, blood (routine x 2)     Status: Abnormal   Collection Time: 02/19/20  7:39 AM   Specimen: BLOOD   Result Value Ref Range Status   Specimen Description BLOOD RIGHT ANTECUBITAL  Final   Special Requests   Final    BOTTLES DRAWN AEROBIC AND ANAEROBIC Blood Culture adequate volume   Culture  Setup Time   Final    GRAM POSITIVE COCCI IN CLUSTERS IN BOTH AEROBIC AND ANAEROBIC BOTTLES Organism ID to follow CRITICAL RESULT CALLED TO, READ BACK BY AND VERIFIED WITH2147 PHARMD 2148 02/20/20 A BROWNING Performed at [facility], [address] [place], [address] [place]    Culture (A)  Final    STAPHYLOCOCCUS SIMULANS CORRECTED ON 04/04 AT 1031: PREVIOUSLY REPORTED AS STAPHYLOCOCCUS SPECIES (COAGULASE NEGATIVE)   Report Status 02/23/2020 FINAL  Final   Organism ID, Bacteria STAPHYLOCOCCUS SIMULANS  Final      Susceptibility   Staphylococcus simulans - MIC*    CIPROFLOXACIN <=0.5 SENSITIVE Sensitive     ERYTHROMYCIN >=8 RESISTANT Resistant  GENTAMICIN <=0.5 SENSITIVE Sensitive     OXACILLIN >=4 RESISTANT Resistant     TETRACYCLINE >=16 RESISTANT Resistant     VANCOMYCIN <=0.5 SENSITIVE Sensitive     TRIMETH/SULFA 160 RESISTANT Resistant     CLINDAMYCIN RESISTANT Resistant     RIFAMPIN <=0.5 SENSITIVE Sensitive     Inducible Clindamycin POSITIVE Resistant     * STAPHYLOCOCCUS SIMULANS CORRECTED ON 04/04 AT 1031: PREVIOUSLY REPORTED AS STAPHYLOCOCCUS SPECIES (COAGULASE NEGATIVE)  Blood Culture ID Panel (Reflexed)     Status: Abnormal   Collection Time: 02/19/20  7:39 AM  Result Value Ref Range Status   Enterococcus species NOT DETECTED NOT DETECTED Final   Listeria monocytogenes NOT DETECTED NOT DETECTED Final   Staphylococcus species DETECTED (A) NOT DETECTED Final    Comment: Methicillin (oxacillin) resistant coagulase negative staphylococcus. Possible blood culture contaminant (unless isolated from more than one blood culture draw or clinical case suggests pathogenicity). No antibiotic treatment is indicated for blood  culture contaminants. CRITICAL RESULT CALLED TO,  READ BACK BY AND VERIFIED WITH: B MANCHERIL PHARMD 2148 02/20/20 A BROWNING    Staphylococcus aureus (BCID) NOT DETECTED NOT DETECTED Final   Methicillin resistance DETECTED (A) NOT DETECTED Final    Comment: CRITICAL RESULT CALLED TO, READ BACK BY AND VERIFIED WITH: B MANCHERIL PHARMD 2148 02/20/20 A BROWNING    Streptococcus species NOT DETECTED NOT DETECTED Final   Streptococcus agalactiae NOT DETECTED NOT DETECTED Final   Streptococcus pneumoniae NOT DETECTED NOT DETECTED Final   Streptococcus pyogenes NOT DETECTED NOT DETECTED Final   Acinetobacter baumannii NOT DETECTED NOT DETECTED Final   Enterobacteriaceae species NOT DETECTED NOT DETECTED Final   Enterobacter cloacae complex NOT DETECTED NOT DETECTED Final   Escherichia coli NOT DETECTED NOT DETECTED Final   Klebsiella oxytoca NOT DETECTED NOT DETECTED Final   Klebsiella pneumoniae NOT DETECTED NOT DETECTED Final   Proteus species NOT DETECTED NOT DETECTED Final   Serratia marcescens NOT DETECTED NOT DETECTED Final   Haemophilus influenzae NOT DETECTED NOT DETECTED Final   Neisseria meningitidis NOT DETECTED NOT DETECTED Final   Pseudomonas aeruginosa NOT DETECTED NOT DETECTED Final   Candida albicans NOT DETECTED NOT DETECTED Final   Candida glabrata NOT DETECTED NOT DETECTED Final   Candida krusei NOT DETECTED NOT DETECTED Final   Candida parapsilosis NOT DETECTED NOT DETECTED Final   Candida tropicalis NOT DETECTED NOT DETECTED Final    Comment: Performed at [facility], [address] [place], [place] [address]  Culture, blood (routine x 2)     Status: Abnormal   Collection Time: 02/19/20  7:47 AM   Specimen: BLOOD  Result Value Ref Range Status   Specimen Description BLOOD RIGHT ANTECUBITAL  Final   Special Requests   Final    BOTTLES DRAWN AEROBIC AND ANAEROBIC Blood Culture adequate volume   Culture  Setup Time   Final    GRAM POSITIVE COCCI IN CLUSTERS IN BOTH AEROBIC AND ANAEROBIC BOTTLES CRITICAL RESULT  CALLED TO, READ BACK BY AND VERIFIED WITH: Janice Norrie, PHARMD AT 1908 ON 02/20/20 BY C. JESSUP, MT Performed at [facility], [address] [place], [place] [address]    Culture STAPHYLOCOCCUS EPIDERMIDIS (A)  Final   Report Status 02/23/2020 FINAL  Final   Organism ID, Bacteria STAPHYLOCOCCUS EPIDERMIDIS  Final      Susceptibility   Staphylococcus epidermidis - MIC*    CIPROFLOXACIN <=0.5 SENSITIVE Sensitive     ERYTHROMYCIN >=8 RESISTANT Resistant  GENTAMICIN <=0.5 SENSITIVE Sensitive     OXACILLIN 1 RESISTANT Resistant     TETRACYCLINE <=1 SENSITIVE Sensitive     VANCOMYCIN <=0.5 SENSITIVE Sensitive     TRIMETH/SULFA 20 SENSITIVE Sensitive     CLINDAMYCIN >=8 RESISTANT Resistant     RIFAMPIN <=0.5 SENSITIVE Sensitive     Inducible Clindamycin NEGATIVE Sensitive     * STAPHYLOCOCCUS EPIDERMIDIS  Culture, blood (routine x 2)     Status: Abnormal   Collection Time: 02/22/20  7:02 AM   Specimen: BLOOD  Result Value Ref Range Status   Specimen Description BLOOD LEFT ANTECUBITAL  Final   Special Requests   Final    BOTTLES DRAWN AEROBIC AND ANAEROBIC Blood Culture adequate volume   Culture  Setup Time   Final    GRAM POSITIVE COCCI IN BOTH AEROBIC AND ANAEROBIC BOTTLES CRITICAL RESULT CALLED TO, READ BACK BY AND VERIFIED WITH: PHRMD J LEDFORD @0456  02/23/20 BY S GEZAHEGN    Culture (A)  Final    STAPHYLOCOCCUS HOMINIS STAPHYLOCOCCUS EPIDERMIDIS SUSCEPTIBILITIES PERFORMED ON PREVIOUS CULTURE WITHIN THE LAST 5 DAYS. Performed at [facility], [address], [place], [place] [address]    Report Status 02/25/2020 FINAL  Final  Culture, blood (routine x 2)     Status: None (Preliminary result)   Collection Time: 02/22/20  7:11 AM   Specimen: BLOOD  Result Value Ref Range Status   Specimen Description BLOOD LEFT ARM  Final   Special Requests   Final    BOTTLES DRAWN AEROBIC AND ANAEROBIC Blood Culture adequate volume   Culture   Final    NO GROWTH 3 DAYS Performed at  [facility], [address] [place], [place] [address]    Report Status PENDING  Incomplete     Time coordinating discharge: 35 minutes  SIGNED:   Dorcas Carrow, MD  [facility] 02/25/2020, 11:56 AM

## 2020-02-25 NOTE — Progress Notes (Signed)
Pt is on hospice care now. Lab called with result of the repeat cultures of staph epi and hominis 1/2 sets. Prob a contaminant. FYI text Dr. Lyndel Safe.  Tyler Vargas, PharmD, BCIDP, AAHIVP, CPP Infectious Disease Pharmacist 02/25/2020 8:40 AM

## 2020-02-25 NOTE — Progress Notes (Signed)
[facility] ([facility]) Hospital Liaison note.   Received request from TOC manager for family interest in [facility]. Chart reviewed and eligibility confirmed. Spoke with family to confirm interest and explain services. Family agreeable to transfer today. TOC aware.   [facility] will notify TOC when registration paperwork has been completed to arrange transport.  RN please call report to [phone].   Thank you,   Mary Anne Robertson, RN, CCM  [facility] Hospital Liaison (listed on [facility] under Hospice[facility])  [phone]  

## 2020-02-25 NOTE — Progress Notes (Signed)
Daily Progress Note   Patient Name: Tyler Vargas       Date: 02/25/2020 DOB: Feb 03, 1986  Age: 34 y.o. MRN#: [id number] Attending Physician: Barb Merino, MD Primary Care Physician: Patient, No Pcp Per Admit Date: 02/14/2020  Reason for Consultation/Follow-up: Establishing goals of care and Terminal Care  Subjective: Per RN, patient appears comfortable without s/s of distress or discomfort. Remains unresponsive.   GOC:  Spoke with father x2 this morning. Discussed plan of care including comfort measures and symptom management medications. Discussed hospice philosophy and hospice facility. Father heard from [facility] and they are willing to accept with covid positive test on admit. Father understands that cortrak and tube feeds will be discontinued prior to transfer to hospice facility. Father shares that him and family hope to visit Tyler Vargas today. Answered questions and concerns. Emotional support provided. Father has PMT contact information.   Length of Stay: 11  Current Medications: Scheduled Meds:  .  stroke: mapping our early stages of recovery book   Does not apply Once  . influenza vac split quadrivalent PF  0.5 mL Intramuscular Tomorrow-1000  . pneumococcal 23 valent vaccine  0.5 mL Intramuscular Tomorrow-1000    Continuous Infusions: . dextrose 5% lactated ringers 10 mL/hr at 02/21/20 0400  . feeding supplement (OSMOLITE 1.5 CAL) 1,000 mL (02/24/20 1135)    PRN Meds: acetaminophen **OR** acetaminophen (TYLENOL) oral liquid 160 mg/5 mL **OR** acetaminophen, glycopyrrolate, LORazepam, morphine CONCENTRATE, senna-docusate  Physical Exam Vitals and nursing note reviewed.            Vital Signs: BP 109/73 (BP Location: Right Arm)   Pulse (!) 111   Temp 99.7 F (37.6 C)  (Axillary)   Resp (!) 27   Ht 5\' 8"  (1.727 m)   Wt 64.8 kg   SpO2 100%   BMI 21.72 kg/m  SpO2: SpO2: 100 % O2 Device: O2 Device: Room Air O2 Flow Rate:    Intake/output summary:   Intake/Output Summary (Last 24 hours) at 02/25/2020 1023 Last data filed at 02/24/2020 1855 Gross per 24 hour  Intake 0 ml  Output 450 ml  Net -450 ml   LBM: Last BM Date: (pta) Baseline Weight: Weight: 67.7 kg Most recent weight: Weight: 64.8 kg       Palliative Assessment/Data: PPS 10%      Patient  Active Problem List   Diagnosis Date Noted  . Palliative care by specialist   . Goals of care, counseling/discussion   . General weakness   . Coagulase negative Staphylococcus bacteremia 02/21/2020  . COVID-19 02/16/2020  . Acute CVA (cerebrovascular accident) (HCC) 02/14/2020  . Cocaine use disorder, mild, abuse (HCC) 01/29/2020  . Psoriasis 09/30/2019  . 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 HIV (human immunodeficiency virus infection) (HCC) 09/30/2019  . AIDS (acquired immune deficiency syndrome) (HCC) 09/30/2019  . History of syphilis 09/30/2019  . Male-to-male transgender person 09/30/2019  . Depression 09/30/2019    Palliative Care Assessment & Plan   Patient Profile: Patient is 69 years old.  Lives at home with roommate.  Past medical history of HIV, syphilis, cocaine use.  Patient admitted with weakness/aphasia found to have acute right MCA infarct.  Hospital course complicated by persistent encephalopathy, COVID-19 pneumonia and staph bacteremia.  Infectious disease specialist following.  Hospital course also significant for patient requiring core track tube and tube feedings.  A palliative medicine consultation has been requested for goals of care discussions with family.  Assessment: Acute right MCA CVA Dysphagia with cortrak/tube feeds Covid 19 viral pneumonia Acute metabolic encephalopathy ? Aseptic meningitis/encephalitis Staph bacteremia HIV/AIDS Hx of syphilis Hx of cocaine  use  Recommendations/Plan:  Further GOC discussion on 4/6 with father Emmaline Kluver) who has spoken with the rest of the family. Understanding diagnoses and poor prognosis, family does not wish to pursue ongoing aggressive medical management including PEG tube. Family is ready for shift to comfort measures and hospice facility placement.  Comfort meds initiated. Discontinued interventions not aimed at comfort.  Father prefers to continue cortrak/tube feeds until discharge to hospice facility. Father understands this will be discontinued when transferred to hospice facility.   TOC team following. [facility] will accept covid positive patient. Possible transfer to hospice today if bed available.   Code Status: DNR   Code Status Orders  (From admission, onward)         Start     Ordered   02/21/20 1130  Do not attempt resuscitation (DNR)  Continuous    Question Answer Comment  In the event of cardiac or respiratory ARREST Do not call a "code blue"   In the event of cardiac or respiratory ARREST Do not perform Intubation, CPR, defibrillation or ACLS   In the event of cardiac or respiratory ARREST Use medication by any route, position, wound care, and other measures to relive pain and suffering. May use oxygen, suction and manual treatment of airway obstruction as needed for comfort.      02/21/20 1129        Code Status History    Date Active Date Inactive Code Status Order ID Comments User Context   02/14/2020 2231 02/21/2020 1129 Full Code [id number]  Briscoe Deutscher, MD ED   Advance Care Planning Activity       Prognosis:   < 2 weeks: once all life-prolonging interventions are discontinued.  Discharge Planning:  Hospice facility  Care plan was discussed with father Emmaline Kluver), RN  Thank you for allowing the Palliative Medicine Team to assist in the care of this patient.  The above conversation was completed via telephone due to visitor restrictions during COVID-19 pandemic.  Thorough chart review and discussion with multidisciplinary team was completed as part of assessment. No physical examination was performed.    Time In: 1015 Time Out: 1040 Total Time 25 Prolonged Time Billed no      Greater  than 50%  of this time was spent counseling and coordinating care related to the above assessment and plan.  Vennie Homans, DNP, FNP-C Palliative Medicine Team  Phone: [phone] Fax: [phone]  Please contact Palliative Medicine Team phone at [phone] for questions and concerns.

## 2020-02-25 NOTE — Progress Notes (Signed)
CSW received hospice facility consult. CSW left voicemail for [facility] to see of they can accept patient COVID positive.   Osborne Casco Lakecia Deschamps LCSW [phone]

## 2020-02-25 NOTE — TOC Transition Note (Signed)
Transition of Care [facility]) - CM/SW Discharge Note   Patient Details  Name: Tyler Vargas MRN: [id number] Date of Birth: October 25, 1986  Transition of [facility]) CM/SW Contact:  Erin Sons, LCSW Phone Number: 02/25/2020, 2:50 PM   Clinical Narrative:      Patient will DC to: [facility] Anticipated DC date: 02/25/20 Family notified: Darek, Eifler (Father)  [phone] (Mobile) Transport by: Sharin Mons   Per MD patient ready for DC to [facility] . RN, patient, patient's family, and facility notified of DC. Discharge Summary and FL2 sent to facility. RN to call report prior to discharge [phone]). DC packet on chart. Ambulance transport requested for patient.   CSW will sign off for now as social work intervention is no longer needed. Please consult [place] again if new needs arise.   Final next level of care: Hospice Medical Facility Barriers to Discharge: No Barriers Identified   Patient Goals and CMS Choice Patient states their goals for this hospitalization and ongoing recovery are:: Comfort CMS Medicare.gov Compare Post Acute Care list provided to:: Patient Represenative (must comment)(Father) Choice offered to / list presented to : Parent  Discharge Placement              Patient chooses bed at: Other - please specify in the comment section below:([facility]) Patient to be transferred to facility by: [facility] Name of family member notified: Haruto, Demaria (Father) [phone] [facility]) Patient and family notified of of transfer: 02/25/20  Discharge Plan and Services In-house Referral: Clinical Social Work   Post Acute Care Choice: Hospice                               Social Determinants of Health (SDOH) Interventions     Readmission Risk Interventions No flowsheet data found.

## 2020-02-25 NOTE — Progress Notes (Signed)
PT Cancellation Note  Patient Details Name: Tyler Vargas MRN: [id number] DOB: May 19, 1986   Cancelled Treatment:    Reason Eval/Treat Not Completed: Other (comment)(Patient now inpatient hospice. PT orders cancelled by NP)   [name] 02/25/2020, 1:12 PM

## 2020-02-27 LAB — CULTURE, BLOOD (ROUTINE X 2)
Culture: NO GROWTH
Special Requests: ADEQUATE

## 2020-03-20 DEATH — deceased

## 2020-03-29 ENCOUNTER — Ambulatory Visit: Payer: Self-pay | Admitting: Infectious Diseases

## 2020-06-21 ENCOUNTER — Encounter: Payer: Self-pay | Admitting: Infectious Diseases

## 2020-06-21 NOTE — Progress Notes (Signed)
Patient ID: Tyler Vargas, adult   DOB: January 17, 1986, 34 y.o.   MRN: [id number] Working Viral Load List  Per Patients father Joseantonio Dittmar  Patient [name] passed away on March 13, 2020

## 2020-09-11 IMAGING — CT CT HEAD W/O CM
3 of 4 series · 14 of 47 positions shown, 16 images · non-contrast
Comparison: Head CT 02/07/2020, brain MRI 02/15/2020

CLINICAL DATA: Stroke, follow-up.

EXAM:
CT HEAD WITHOUT CONTRAST
TECHNIQUE: Contiguous axial images were obtained from the base of the skull
through the vertex without intravenous contrast.

[Series 4: head 2.0 h70h · axial · 0.41mm/px · z∈[-352,-218]mm · 8 of 83 slices shown, 10 images]
[im 8/83  brain]
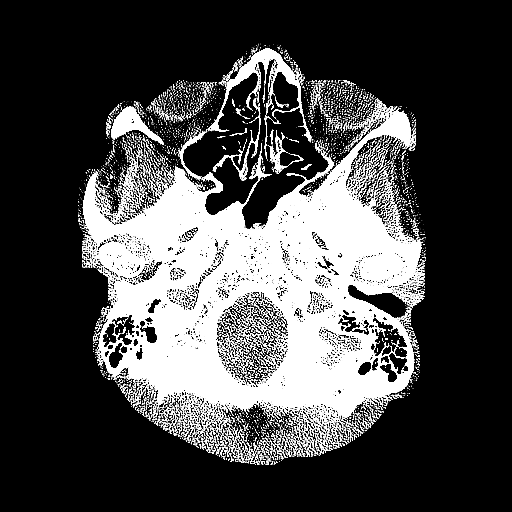
[im 8/83  bone]
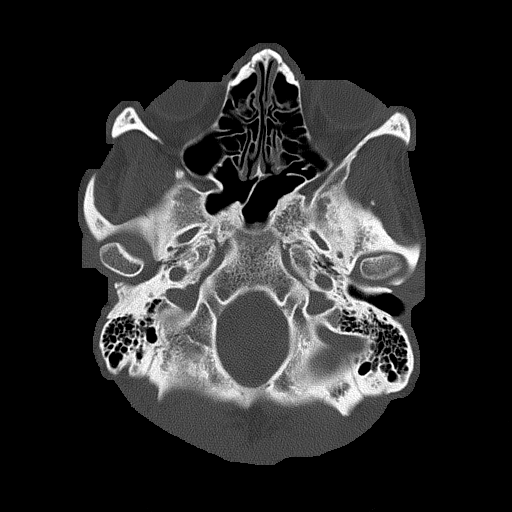
[im 16/83  brain]
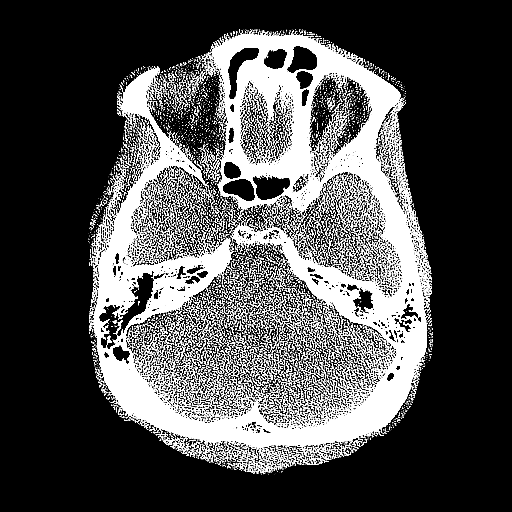
[im 28/83  brain]
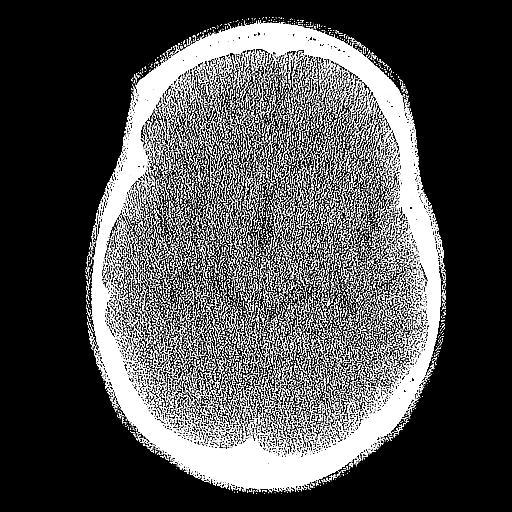
[im 36/83  brain]
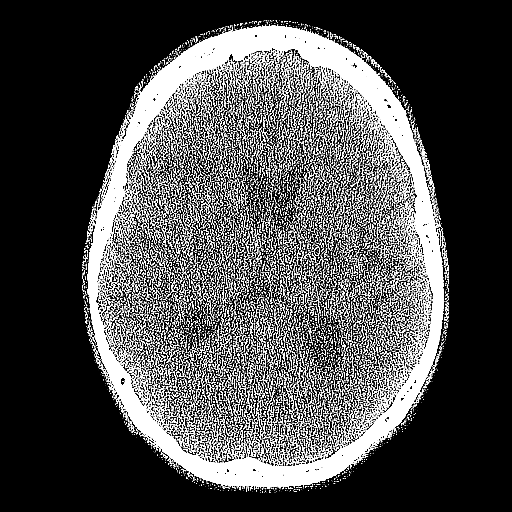
[im 47/83  brain]
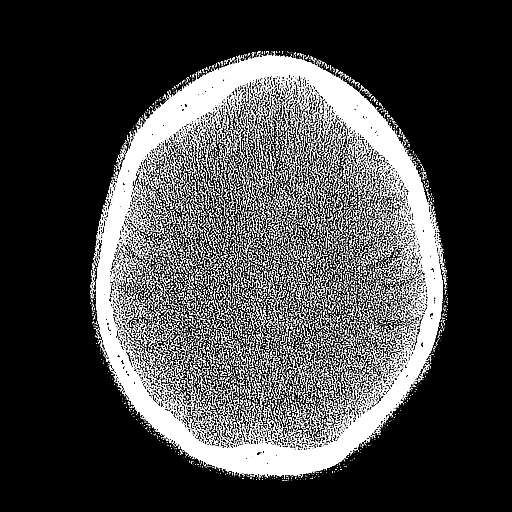
[im 47/83  bone]
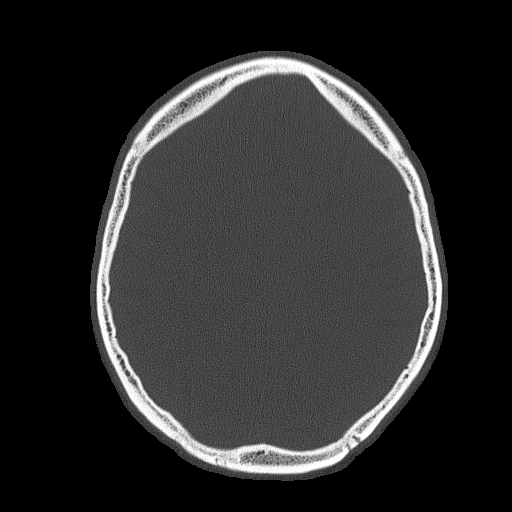
[im 55/83  brain]
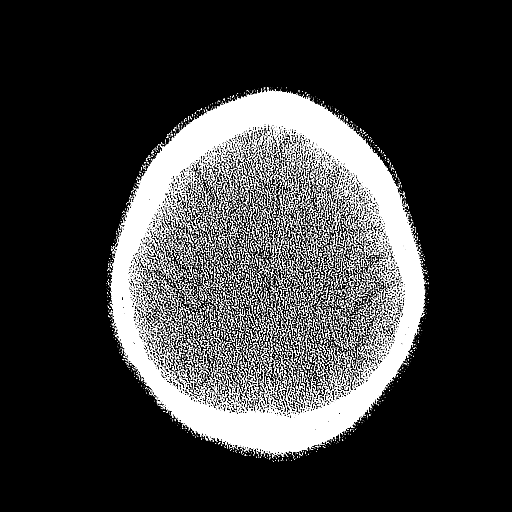
[im 67/83  brain]
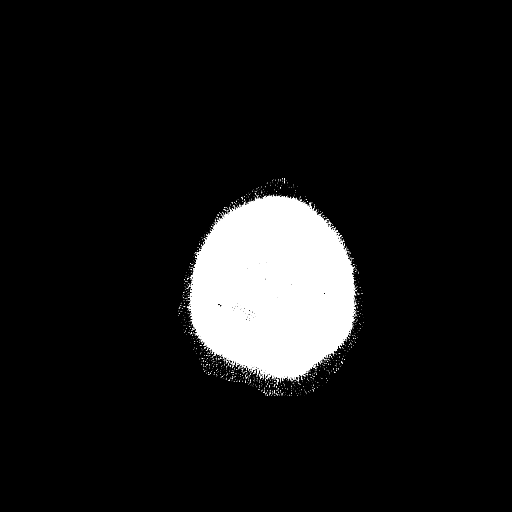
[im 75/83  brain]
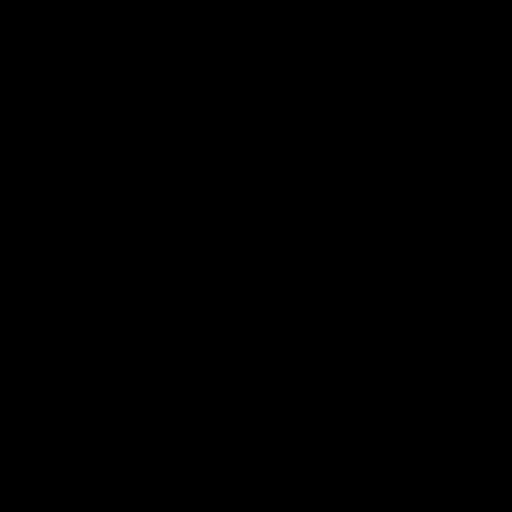

[Series 5: head 3.0 mpr cor · coronal · 0.33mm/px · 3 of 76 slices shown]
[im 26/76  brain]
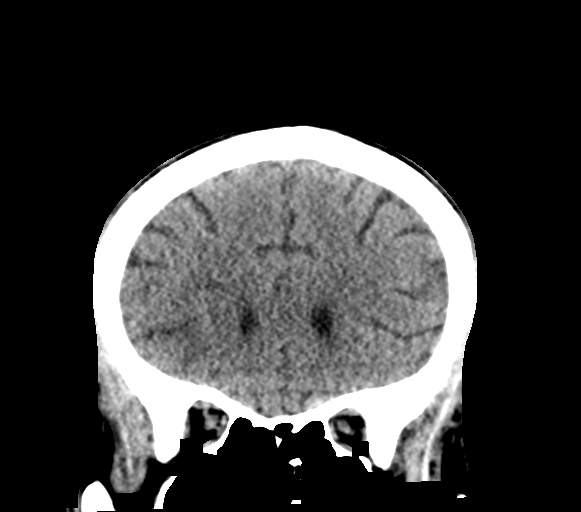
[im 34/76  brain]
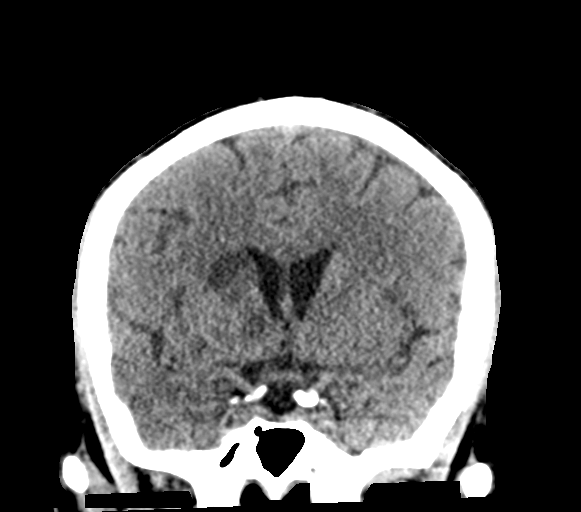
[im 42/76  brain]
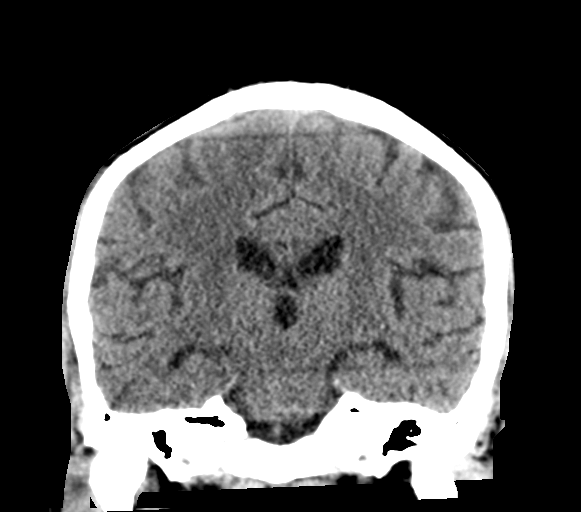

[Series 6: head 3.0 mpr sag · sagittal · 0.35mm/px · 3 of 66 slices shown]
[im 22/66  brain]
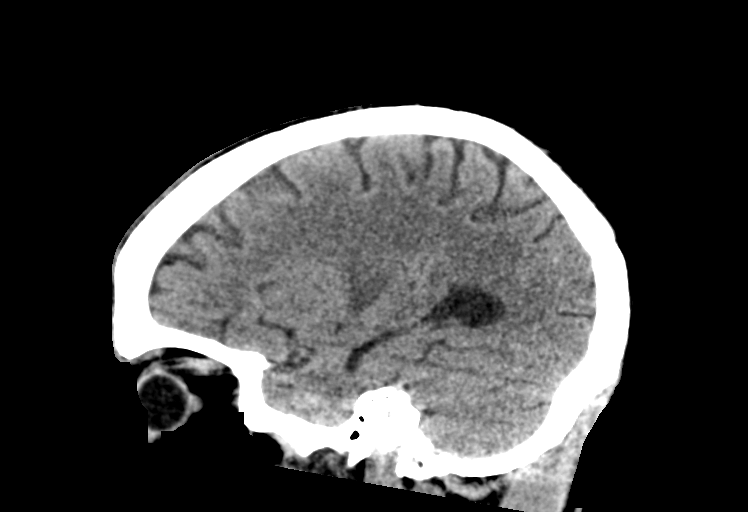
[im 33/66  brain]
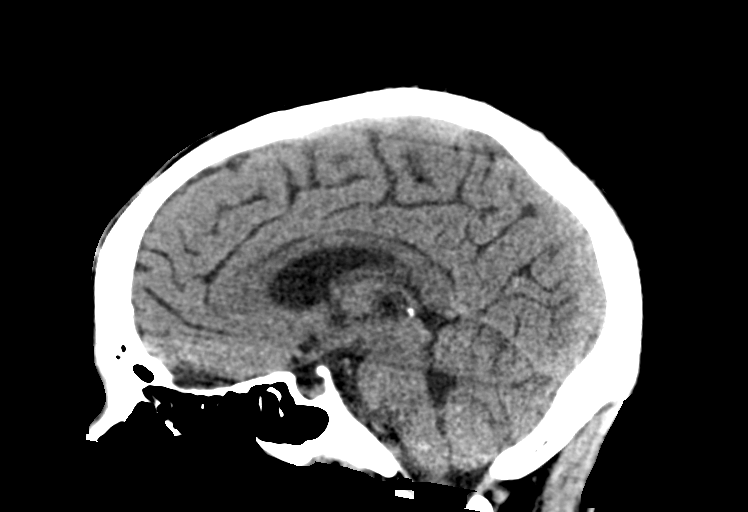
[im 44/66  brain]
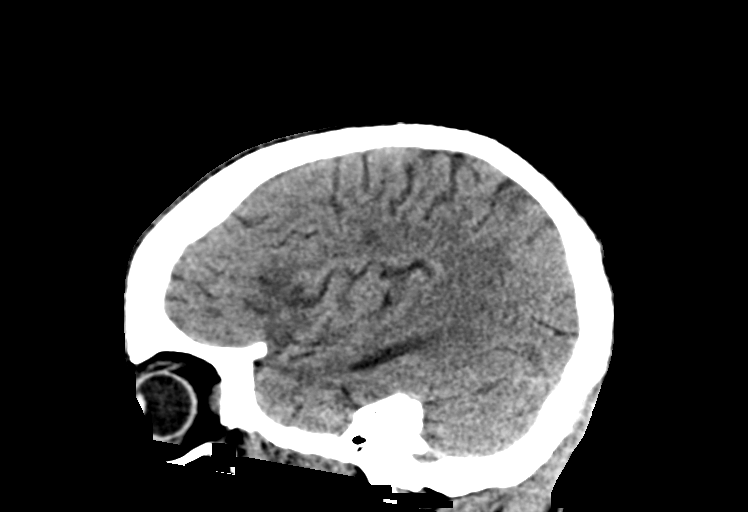

[14 of 47 positions shown; findings below may reference images not displayed]

FINDINGS: Brain:

Again demonstrated are, now subacute, infarcts within the right
basal ganglia, right internal capsule, right frontal operculum,
right insula and subinsular region as well as left basal/internal
capsule. These infarcts have not significant changed in extent as
compared to prior head CT 02/15/2020. No significant mass effect. No
midline shift. No evidence of hemorrhagic conversion. No new
demarcated infarct is identified. No evidence of intracranial mass.
No extra-axial fluid collection. Stable, mild generalized
parenchymal atrophy.

Vascular: Redemonstrated hyperdense right MCA branch within the
right sylvian fissure.

Skull: Normal. Negative for fracture or focal lesion.

Sinuses/Orbits: Visualized orbits demonstrate no acute abnormality.
Mild ethmoid sinus mucosal thickening. No significant mastoid
effusion at the imaged levels
IMPRESSION: Subacute infarcts within the right MCA territory as well as left
basal ganglia/internal capsule, unchanged in extent as compared to
head CT 02/15/2020. No significant mass effect. No hemorrhagic
conversion.

No interval infarct is identified.

Stable, mild generalized parenchymal atrophy.

Mild ethmoid sinus mucosal thickening.

## 2021-12-15 ENCOUNTER — Telehealth: Payer: Self-pay

## 2021-12-15 NOTE — Telephone Encounter (Signed)
Patient last seen 01/2020 - called to offer appointment. Call could not be completed.   Beryle Flock, RN

## 2022-03-02 ENCOUNTER — Telehealth: Payer: Self-pay

## 2022-03-02 NOTE — Telephone Encounter (Signed)
Called patient to offer appointment, no answer and unable to leave message.  ? ?Keyondra Lagrand D Ricca Melgarejo, RN ? ?
